# Patient Record
Sex: Male | Born: 1960 | Race: Black or African American | Hispanic: No | Marital: Single | Smoking: Never smoker
Health system: Southern US, Community
[De-identification: ages and names within clinical notes are randomized; demographics above are authoritative.]

## PROBLEM LIST (undated history)

## (undated) DIAGNOSIS — R112 Nausea with vomiting, unspecified: Secondary | ICD-10-CM

## (undated) DIAGNOSIS — Z9889 Other specified postprocedural states: Secondary | ICD-10-CM

## (undated) DIAGNOSIS — M109 Gout, unspecified: Secondary | ICD-10-CM

## (undated) DIAGNOSIS — R739 Hyperglycemia, unspecified: Secondary | ICD-10-CM

## (undated) DIAGNOSIS — R519 Headache, unspecified: Secondary | ICD-10-CM

## (undated) DIAGNOSIS — R7303 Prediabetes: Secondary | ICD-10-CM

## (undated) DIAGNOSIS — G4730 Sleep apnea, unspecified: Secondary | ICD-10-CM

## (undated) DIAGNOSIS — I10 Essential (primary) hypertension: Secondary | ICD-10-CM

## (undated) DIAGNOSIS — J189 Pneumonia, unspecified organism: Secondary | ICD-10-CM

## (undated) DIAGNOSIS — M1990 Unspecified osteoarthritis, unspecified site: Secondary | ICD-10-CM

## (undated) HISTORY — DX: Essential (primary) hypertension: I10

## (undated) HISTORY — DX: Hyperglycemia, unspecified: R73.9

## (undated) HISTORY — PX: KNEE SURGERY: SHX244

## (undated) HISTORY — DX: Gout, unspecified: M10.9

---

## 2009-04-24 ENCOUNTER — Emergency Department (HOSPITAL_BASED_OUTPATIENT_CLINIC_OR_DEPARTMENT_OTHER): Admission: EM | Admit: 2009-04-24 | Discharge: 2009-04-24 | Payer: Self-pay | Admitting: Emergency Medicine

## 2010-06-20 LAB — BASIC METABOLIC PANEL
GFR calc Af Amer: >60 mL/min (ref >60)
Sodium: 143 mEq/L (ref 135–145)

## 2012-03-04 ENCOUNTER — Emergency Department (INDEPENDENT_AMBULATORY_CARE_PROVIDER_SITE_OTHER): Payer: BC Managed Care – PPO

## 2012-03-04 ENCOUNTER — Encounter: Payer: Self-pay | Admitting: Emergency Medicine

## 2012-03-04 ENCOUNTER — Emergency Department
Admission: EM | Admit: 2012-03-04 | Discharge: 2012-03-04 | Disposition: A | Discharge status: Home / Self Care | Payer: BC Managed Care – PPO | Source: Home / Self Care | Attending: Family Medicine | Admitting: Family Medicine

## 2012-03-04 DIAGNOSIS — J111 Influenza due to unidentified influenza virus with other respiratory manifestations: Secondary | ICD-10-CM

## 2012-03-04 DIAGNOSIS — R059 Cough, unspecified: Secondary | ICD-10-CM

## 2012-03-04 DIAGNOSIS — R05 Cough: Secondary | ICD-10-CM

## 2012-03-04 DIAGNOSIS — J069 Acute upper respiratory infection, unspecified: Secondary | ICD-10-CM

## 2012-03-04 DIAGNOSIS — J101 Influenza due to other identified influenza virus with other respiratory manifestations: Secondary | ICD-10-CM

## 2012-03-04 DIAGNOSIS — R6883 Chills (without fever): Secondary | ICD-10-CM

## 2012-03-04 LAB — POCT CBC W AUTO DIFF (K'VILLE URGENT CARE)

## 2012-03-04 LAB — POCT INFLUENZA A/B: Influenza A, POC: POSITIVE

## 2012-03-04 MED ORDER — OSELTAMIVIR PHOSPHATE 75 MG PO CAPS: 75.0000 mg | ORAL_CAPSULE | Freq: Two times a day (BID) | ORAL | Status: DC

## 2012-03-04 MED ORDER — HYDROCOD POLST-CHLORPHEN POLST 10-8 MG/5ML PO LQCR: 5.0000 mL | Freq: Every evening | ORAL | Status: DC | PRN

## 2012-03-04 NOTE — ED Notes (Signed)
Congestion, ears hurt, body aches, chills x 3 weeks

## 2012-03-04 NOTE — ED Provider Notes (Addendum)
History     CSN: [id number]  Arrival date & time 03/04/12  8413   None     Chief Complaint  Patient presents with  . URI      HPI Comments: Patient complains of approximately 3 week history of gradually progressive URI symptoms beginning with a mild sore throat, followed by progressive nasal congestion and a cough.  He has not felt ill during this period, but over the past two days he has developed flu-like symptoms with myalgias, headache, chills, increased fatigue, and increased cough.  Cough is now worse at night and generally non-productive during the day.  There has been no pleuritic pain, shortness of breath, or wheezes, but he does feel tightness in his anterior chest.  He has had some loose stools today, but no nausea/vomiting.  He has not had flu immunization   The history is provided by the patient.    History reviewed. No pertinent past medical history.  Past Surgical History  Procedure Date  . Knee surgery     History reviewed. No pertinent family history.  History  Substance Use Topics  . Smoking status: Never Smoker   . Smokeless tobacco: Not on file  . Alcohol Use: Yes      Review of Systems + sore throat + cough No pleuritic pain, but feels tight in anterior chest No wheezing + nasal congestion + post-nasal drainage No sinus pain/pressure No itchy/red eyes ? earache No hemoptysis No SOB ? fever, + chills No nausea No vomiting No abdominal pain No diarrhea, but loose stools today No urinary symptoms No skin rashes + fatigue + myalgias + headache Used OTC meds without relief  Allergies  Review of patient's allergies indicates no known allergies.  Home Medications   Current Outpatient Rx  Name  Route  Sig  Dispense  Refill  . HYDROCOD POLST-CPM POLST ER 10-8 MG/5ML PO LQCR   Oral   Take 5 mLs by mouth at bedtime as needed.   115 mL   0   . DM-GUAIFENESIN ER 30-600 MG PO TB12   Oral   Take 1 tablet by mouth every 12 (twelve)  hours.         . IBUPROFEN 200 MG PO TABS   Oral   Take 200 mg by mouth every 6 (six) hours as needed.         . OSELTAMIVIR PHOSPHATE 75 MG PO CAPS   Oral   Take 1 capsule (75 mg total) by mouth every 12 (twelve) hours.   10 capsule   0   . PSEUDOEPH-DOXYLAMINE-DM-APAP 60-7.08-30-998 MG/30ML PO LIQD   Oral   Take by mouth.           BP 142/94  Pulse 102  Temp 98.9 F (37.2 C) (Oral)  Resp 16  Ht 6' (1.829 m)  Wt 285 lb (129.275 kg)  BMI 38.65 kg/m2  SpO2 96%  Physical Exam Nursing notes and Vital Signs reviewed. Appearance:  Patient appears stated age, and in no acute distress.  Patient is obese (BMI 38.7) Eyes:  Pupils are equal, round, and reactive to light and accomodation.  Extraocular movement is intact.  Conjunctivae are not inflamed  Ears:  Canals normal.  Tympanic membranes normal.  Nose:  Mildly congested turbinates.  No sinus tenderness.  Pharynx:  Minimal erythema Neck:  Supple.  No adenopathy Lungs:  Clear to auscultation.  Breath sounds are equal.  Heart:  Regular rate and rhythm without murmurs, rubs, or gallops.  Abdomen:  Nontender without masses or hepatosplenomegaly.  Bowel sounds are present.  No CVA or flank tenderness.  Extremities:  No edema.  No calf tenderness Skin:  No rash present.   ED Course  Procedures none   Labs Reviewed  POCT CBC W AUTO DIFF ([facility])   WBC 5.2; LY 16.6; MO 10.1; GR 73.3; Hgb 15.1; Platelets 200   POCT INFLUENZA A/B:  Positive A      1. Influenza A;  Suspect that patient had a minor viral URI that preceded his current influenza infection   2.  Viral upper respiratory infection, resolving    MDM  Begin Tamiflu.  Rx for Tussionex at bedtime. Take Mucinex, or Mucinex D (guaifenesin with decongestant) twice daily for congestion.  Increase fluid intake, rest. May use Afrin nasal spray (or generic oxymetazoline) twice daily for about 5 days.  Also recommend using saline nasal spray several times  daily and saline nasal irrigation (AYR is a common brand) Stop all antihistamines for now, and other non-prescription cough/cold preparations. May take Ibuprofen 200mg , 4 tabs every 8 hours with food for chest/sternum discomfort, headache, fever, etc. Recommend flu shot when well. Follow-up with family doctor if not improving about one week.        Lattie Haw, MD 03/04/12 1610  Lattie Haw, MD 03/26/12 [phone]

## 2012-06-15 ENCOUNTER — Encounter: Payer: Self-pay | Admitting: Emergency Medicine

## 2012-06-15 ENCOUNTER — Emergency Department (INDEPENDENT_AMBULATORY_CARE_PROVIDER_SITE_OTHER)
Admission: EM | Admit: 2012-06-15 | Discharge: 2012-06-15 | Disposition: A | Discharge status: Home / Self Care | Payer: BC Managed Care – PPO | Source: Home / Self Care | Attending: Family Medicine | Admitting: Family Medicine

## 2012-06-15 DIAGNOSIS — M79609 Pain in unspecified limb: Secondary | ICD-10-CM

## 2012-06-15 DIAGNOSIS — M79674 Pain in right toe(s): Secondary | ICD-10-CM

## 2012-06-15 DIAGNOSIS — M109 Gout, unspecified: Secondary | ICD-10-CM

## 2012-06-15 DIAGNOSIS — I10 Essential (primary) hypertension: Secondary | ICD-10-CM

## 2012-06-15 LAB — CBC
MCH: 25.3 pg — ABNORMAL LOW (ref 26.0–34.0)
MCV: 75.5 fL — ABNORMAL LOW (ref 78.0–100.0)
Platelets: 265 10*3/uL (ref 150–400)
RDW: 16.1 % — ABNORMAL HIGH (ref 11.5–15.5)
WBC: 6.3 10*3/uL (ref 4.0–10.5)

## 2012-06-15 LAB — COMPREHENSIVE METABOLIC PANEL
ALT: 16 U/L (ref 0–53)
AST: 16 U/L (ref 0–37)
Chloride: 102 mEq/L (ref 96–112)
Creat: 1.03 mg/dL (ref 0.50–1.35)
Total Bilirubin: 0.5 mg/dL (ref 0.3–1.2)

## 2012-06-15 MED ORDER — AMLODIPINE BESYLATE 10 MG PO TABS: 5.0000 mg | ORAL_TABLET | Freq: Every day | ORAL | Status: DC

## 2012-06-15 MED ORDER — PREDNISONE 50 MG PO TABS: ORAL_TABLET | ORAL | Status: DC

## 2012-06-15 NOTE — ED Provider Notes (Addendum)
History     CSN: [id number]  Arrival date & time 06/15/12  1044   First MD Initiated Contact with Patient 06/15/12 1044      Chief Complaint  Patient presents with  . Toe Pain   HPI  Patient presents today with chief complaint of right great toe pain x2 days. Patient states he a day and last night which included fried chicken any noticed some right great toe pain starting since last night. Patient denies any recent traumas or injury. Pain is been persistent predominantly in the right great toe. Patient tries tramadol for pain with minimal improvement in symptoms. Pain is persisted throughout the night. Has had some mild redness and swelling. This is never happened before. Patient denies any family history of gout however does report high alcohol as well as high dark meat intake. Patient is obese. Patient is also noted to have elevated blood pressure today. Patient denies any chest pain shortness of breath headache. Patient does not have a PCP. Is currently not on medication for high blood pressure.   History reviewed. No pertinent past medical history.  Past Surgical History  Procedure Laterality Date  . Knee surgery      History reviewed. No pertinent family history.  History  Substance Use Topics  . Smoking status: Never Smoker   . Smokeless tobacco: Not on file  . Alcohol Use: Yes      Review of Systems  All other systems reviewed and are negative.    Allergies  Review of patient's allergies indicates no known allergies.  Home Medications   Current Outpatient Rx  Name  Route  Sig  Dispense  Refill  . chlorpheniramine-HYDROcodone (TUSSIONEX PENNKINETIC ER) 10-8 MG/5ML LQCR   Oral   Take 5 mLs by mouth at bedtime as needed.   115 mL   0   . dextromethorphan-guaiFENesin (MUCINEX DM) 30-600 MG per 12 hr tablet   Oral   Take 1 tablet by mouth every 12 (twelve) hours.         Marland Kitchen ibuprofen (ADVIL,MOTRIN) 200 MG tablet   Oral   Take 200 mg by mouth every 6  (six) hours as needed.         Marland Kitchen oseltamivir (TAMIFLU) 75 MG capsule   Oral   Take 1 capsule (75 mg total) by mouth every 12 (twelve) hours.   10 capsule   0   . Pseudoeph-Doxylamine-DM-APAP (NYQUIL) 60-7.08-30-998 MG/30ML LIQD   Oral   Take by mouth.           BP 169/122  Pulse 94  Temp(Src) 97.9 F (36.6 C) (Oral)  Ht 6' (1.829 m)  Wt 285 lb (129.275 kg)  BMI 38.64 kg/m2  SpO2 97%  Physical Exam  Constitutional:  Obese   HENT:  Head: Normocephalic and atraumatic.  Right Ear: External ear normal.  Left Ear: External ear normal.  Eyes: Conjunctivae are normal. Pupils are equal, round, and reactive to light.  Neck: Normal range of motion.  Pulmonary/Chest: Effort normal and breath sounds normal.  Abdominal:  Obese abdomen  Musculoskeletal:       Feet:  Neurological: He is alert.  Skin: Skin is warm.    ED Course  Procedures (including critical care time)  Labs Reviewed - No data to display No results found.  EKG: normal EKG, normal sinus rhythm.   1. Great toe pain, right   2. Podagra   3. Unspecified essential hypertension       MDM  Toe Pain:  Exam clinically consistent with classic podagra/gout flare. Will start patient on prednisone for treatment. Avoiding NSAIDs in the setting of high blood pressure.  Hypertension: Clinically asymptomatic. I suspect that pain from gout flare is likely contributing. EKG NSR.  We'll check baseline labs including CBC, CMET, direct LDL, TSH. Will start patient on low-dose Norvasc for treatment with plan to followup with primary care within the next 1-2 days. Patient will come in tomorrow for blood pressure recheck. Discuss cardiovascular red flags at length with patient.    The patient and/or caregiver has been counseled thoroughly with regard to treatment plan and/or medications prescribed including dosage, schedule, interactions, rationale for use, and possible side effects and they verbalize understanding.  Diagnoses and expected course of recovery discussed and will return if not improved as expected or if the condition worsens. Patient and/or caregiver verbalized understanding.             Doree Albee, MD 06/15/12 1143  Doree Albee, MD 06/15/12 1226

## 2012-06-15 NOTE — ED Notes (Signed)
Reports onset of severe pain in large toe on right foot last night.

## 2012-06-18 LAB — HEMOGLOBIN A1C: Mean Plasma Glucose: 140 mg/dL — ABNORMAL HIGH (ref <117)

## 2012-06-19 ENCOUNTER — Ambulatory Visit (INDEPENDENT_AMBULATORY_CARE_PROVIDER_SITE_OTHER): Payer: BC Managed Care – PPO | Admitting: Family Medicine

## 2012-06-19 ENCOUNTER — Encounter: Payer: Self-pay | Admitting: Family Medicine

## 2012-06-19 ENCOUNTER — Telehealth: Payer: Self-pay

## 2012-06-19 VITALS — BP 150/92 | HR 120 | Ht 71.0 in | Wt 287.0 lb

## 2012-06-19 DIAGNOSIS — M109 Gout, unspecified: Secondary | ICD-10-CM

## 2012-06-19 DIAGNOSIS — R351 Nocturia: Secondary | ICD-10-CM | POA: Insufficient documentation

## 2012-06-19 DIAGNOSIS — I10 Essential (primary) hypertension: Secondary | ICD-10-CM | POA: Insufficient documentation

## 2012-06-19 DIAGNOSIS — R739 Hyperglycemia, unspecified: Secondary | ICD-10-CM | POA: Insufficient documentation

## 2012-06-19 DIAGNOSIS — R7309 Other abnormal glucose: Secondary | ICD-10-CM

## 2012-06-19 HISTORY — DX: Essential (primary) hypertension: I10

## 2012-06-19 HISTORY — DX: Gout, unspecified: M10.9

## 2012-06-19 HISTORY — DX: Hyperglycemia, unspecified: R73.9

## 2012-06-19 LAB — PSA: PSA: 0.65 ng/mL (ref <=4.00)

## 2012-06-19 MED ORDER — INDOMETHACIN 50 MG PO CAPS: ORAL_CAPSULE | ORAL | Status: DC

## 2012-06-19 MED ORDER — AMLODIPINE BESYLATE 10 MG PO TABS: 10.0000 mg | ORAL_TABLET | Freq: Every day | ORAL | Status: DC

## 2012-06-19 MED ORDER — TAMSULOSIN HCL 0.4 MG PO CAPS: 0.4000 mg | ORAL_CAPSULE | Freq: Every day | ORAL | Status: DC

## 2012-06-19 NOTE — Progress Notes (Signed)
CC: Ernest Navarro is a 52 y.o. male is here for Establish Care and Hypertension   Subjective: HPI:  Pleasant 52 year old here to establish care  Followup hypertension: Diagnosis was given to him this past week in urgent care. He has never been on antihypertensive medication in the past. No outside blood pressures to report. Does not believe his blood pressure has been in issue in the past. Has started amlodipine 5 mg a day without constipation or peripheral edema.  Followup gout: This past week in urgent care he was complaining of a quite tender, red, and swollen base of the right great toe. Since starting prednisone he is noticed significant improvement of his pain however it is still mildly annoying. Worse with bearing weight, flexion, extension. No pain at rest. Swelling and redness is gone. He has a long weekend golf outing tomorrow and concerned about his ability to play with his mild residual pain. He has never had gout before. He admits to a large shellfish dinner the night before his gout flare. Denies recent or remote use of heavy alcohol use  Patient complains of waking to urinate 3-4 times on average every evening. This has been present for 4 months. He does not seem to be getting better or worse since onset. It had a gradual onset over one to 2 weeks. He complains of occasional dribbling after urination. He denies straining to urinate or sensation of incomplete voiding. Denies personal or family history of prostate cancer, denies unintentional weight loss, dysuria, penile discharge.  Review of Systems - General ROS: negative for - chills, fever, night sweats, weight gain or weight loss Ophthalmic ROS: negative for - decreased vision Psychological ROS: negative for - anxiety or depression ENT ROS: negative for - hearing change, nasal congestion, tinnitus or allergies Hematological and Lymphatic ROS: negative for - bleeding problems, bruising or swollen lymph nodes Breast ROS:  negative Respiratory ROS: no cough, shortness of breath, or wheezing Cardiovascular ROS: no chest pain or dyspnea on exertion Gastrointestinal ROS: no abdominal pain, change in bowel habits, or black or bloody stools Genito-Urinary ROS: negative for - genital discharge, genital ulcers,or abnormal bleeding from genitals Musculoskeletal ROS: negative for - joint pain or muscle pain other than that described above Neurological ROS: negative for - headaches or memory loss Dermatological ROS: negative for lumps, mole changes, rash and skin lesion changes  Past Medical History  Diagnosis Date  . Essential hypertension, benign 06/19/2012  . Gout 06/19/2012  . Hyperglycemia 06/19/2012    March 2014 A1c 6.5 (repeat needed)      History reviewed. No pertinent family history.   History  Substance Use Topics  . Smoking status: Never Smoker   . Smokeless tobacco: Not on file  . Alcohol Use: Yes     Objective: Filed Vitals:   06/19/12 0921  BP: 150/92  Pulse: 120    General: Alert and Oriented, No Acute Distress HEENT: Pupils equal, round, reactive to light. Conjunctivae clear.   Moist mucous membranes, pharynx without inflammation nor lesions.  Neck supple without palpable lymphadenopathy nor abnormal masses. Lungs: Clear to auscultation bilaterally, no wheezing/ronchi/rales.  Comfortable work of breathing. Good air movement. Cardiac: Regular rate and rhythm. Normal S1/S2.  No murmurs, rubs, nor gallops.   Abdomen:  obese soft nontender  Extremities: No peripheral edema.  Strong peripheral pulses. Unremarkable right great toe  Mental Status: No depression, anxiety, nor agitation. Skin: Warm and dry.  Assessment & Plan: Ernie was seen today for establish care and hypertension.  Diagnoses and associated orders for this visit:  Hyperglycemia  Essential hypertension, benign - amLODipine (NORVASC) 10 MG tablet; Take 1 tablet (10 mg total) by mouth daily.  Gout - Discontinue:  indomethacin (INDOCIN) 50 MG capsule; Take three by mouth daily, then two by mouth, then one by mouth. - indomethacin (INDOCIN) 50 MG capsule; Take three by mouth daily, then two by mouth, then one by mouth.  Nocturia - PSA - tamsulosin (FLOMAX) 0.4 MG CAPS; Take 1 capsule (0.4 mg total) by mouth daily.    Hyperglycemia: A1c of 6.5 in urgent care however blood sugar at that time was well below 100. We will recheck blood sugar once he is well off of prednisone. Essential hypertension: Uncontrolled, increase Norvasc to 10 mg a day with recheck in clinic in 4 weeks,  diet and exercise interventions discussed Gout: Improved, provided patient with as needed use of indomethacin should his pain flareup with walking/golf this coming weekend, discussed diet interventions to prevent future occurrence Nocturia: Discussed my suspicion for prostate hypertrophy, patient would prefer PSA today and we'll on DRE until PSA is obtained first. Patient will try Flomax to see if this helps in the evening and we will readdress this in 4 weeks or sooner if deteriorating  45 minutes spent face-to-face during visit today of which at least 50% was counseling or coordinating care regarding hyperglycemia, essential hypertension, gout, nocturia.   Return in about 4 weeks (around 07/17/2012).

## 2012-06-19 NOTE — ED Notes (Signed)
Left a message on voice mail asking how patient is feeling and advising to call back with any questions or concerns.  

## 2012-06-19 NOTE — Patient Instructions (Addendum)
Gout Gout is an inflammatory condition (arthritis) caused by a buildup of uric acid crystals in the joints. Uric acid is a chemical that is normally present in the blood. Under some circumstances, uric acid can form into crystals in your joints. This causes joint redness, soreness, and swelling (inflammation). Repeat attacks are common. Over time, uric acid crystals can form into masses (tophi) near a joint, causing disfigurement. Gout is treatable and often preventable. CAUSES  The disease begins with elevated levels of uric acid in the blood. Uric acid is produced by your body when it breaks down a naturally found substance called purines. This also happens when you eat certain foods such as meats and fish. Causes of an elevated uric acid level include:  Being passed down from parent to child (heredity).  Diseases that cause increased uric acid production (obesity, psoriasis, some cancers).  Excessive alcohol use.  Diet, especially diets rich in meat and seafood.  Medicines, including certain cancer-fighting drugs (chemotherapy), diuretics, and aspirin.  Chronic kidney disease. The kidneys are no longer able to remove uric acid well.  Problems with metabolism. Conditions strongly associated with gout include:  Obesity.  High blood pressure.  High cholesterol.  Diabetes. Not everyone with elevated uric acid levels gets gout. It is not understood why some people get gout and others do not. Surgery, joint injury, and eating too much of certain foods are some of the factors that can lead to gout. SYMPTOMS   An attack of gout comes on quickly. It causes intense pain with redness, swelling, and warmth in a joint.  Fever can occur.  Often, only one joint is involved. Certain joints are more commonly involved:  Base of the big toe.  Knee.  Ankle.  Wrist.  Finger. Without treatment, an attack usually goes away in a few days to weeks. Between attacks, you usually will not have  symptoms, which is different from many other forms of arthritis. DIAGNOSIS  Your caregiver will suspect gout based on your symptoms and exam. Removal of fluid from the joint (arthrocentesis) is done to check for uric acid crystals. Your caregiver will give you a medicine that numbs the area (local anesthetic) and use a needle to remove joint fluid for exam. Gout is confirmed when uric acid crystals are seen in joint fluid, using a special microscope. Sometimes, blood, urine, and X-ray tests are also used. TREATMENT  There are 2 phases to gout treatment: treating the sudden onset (acute) attack and preventing attacks (prophylaxis). Treatment of an Acute Attack  Medicines are used. These include anti-inflammatory medicines or steroid medicines.  An injection of steroid medicine into the affected joint is sometimes necessary.  The painful joint is rested. Movement can worsen the arthritis.  You may use warm or cold treatments on painful joints, depending which works best for you.  Discuss the use of coffee, vitamin C, or cherries with your caregiver. These may be helpful treatment options. Treatment to Prevent Attacks After the acute attack subsides, your caregiver may advise prophylactic medicine. These medicines either help your kidneys eliminate uric acid from your body or decrease your uric acid production. You may need to stay on these medicines for a very long time. The early phase of treatment with prophylactic medicine can be associated with an increase in acute gout attacks. For this reason, during the first few months of treatment, your caregiver may also advise you to take medicines usually used for acute gout treatment. Be sure you understand your caregiver's directions.   You should also discuss dietary treatment with your caregiver. Certain foods such as meats and fish can increase uric acid levels. Other foods such as dairy can decrease levels. Your caregiver can give you a list of foods  to avoid. HOME CARE INSTRUCTIONS   Do not take aspirin to relieve pain. This raises uric acid levels.  Only take over-the-counter or prescription medicines for pain, discomfort, or fever as directed by your caregiver.  Rest the joint as much as possible. When in bed, keep sheets and blankets off painful areas.  Keep the affected joint raised (elevated).  Use crutches if the painful joint is in your leg.  Drink enough water and fluids to keep your urine clear or pale yellow. This helps your body get rid of uric acid. Do not drink alcoholic beverages. They slow the passage of uric acid.  Follow your caregiver's dietary instructions. Pay careful attention to the amount of protein you eat. Your daily diet should emphasize fruits, vegetables, whole grains, and fat-free or low-fat milk products.  Maintain a healthy body weight. SEEK MEDICAL CARE IF:   You have an oral temperature above 102 F (38.9 C).  You develop diarrhea, vomiting, or any side effects from medicines.  You do not feel better in 24 hours, or you are getting worse. SEEK IMMEDIATE MEDICAL CARE IF:   Your joint becomes suddenly more tender and you have:  Chills.  An oral temperature above 102 F (38.9 C), not controlled by medicine. MAKE SURE YOU:   Understand these instructions.  Will watch your condition.  Will get help right away if you are not doing well or get worse. Document Released: 03/17/2000 Document Revised: 06/12/2011 Document Reviewed: 06/28/2009 Exit[employer] Patient Information 2013 [employer].    

## 2012-07-17 ENCOUNTER — Encounter: Payer: Self-pay | Admitting: Family Medicine

## 2012-07-17 ENCOUNTER — Ambulatory Visit (INDEPENDENT_AMBULATORY_CARE_PROVIDER_SITE_OTHER): Payer: BC Managed Care – PPO | Admitting: Family Medicine

## 2012-07-17 VITALS — BP 145/95 | HR 84 | Wt 289.0 lb

## 2012-07-17 DIAGNOSIS — R7309 Other abnormal glucose: Secondary | ICD-10-CM

## 2012-07-17 DIAGNOSIS — I10 Essential (primary) hypertension: Secondary | ICD-10-CM

## 2012-07-17 DIAGNOSIS — R739 Hyperglycemia, unspecified: Secondary | ICD-10-CM

## 2012-07-17 DIAGNOSIS — M109 Gout, unspecified: Secondary | ICD-10-CM

## 2012-07-17 DIAGNOSIS — R351 Nocturia: Secondary | ICD-10-CM

## 2012-07-17 NOTE — Progress Notes (Signed)
CC: Ernest Navarro is a 53 y.o. male is here for Hypertension   Subjective: HPI:   Followup hypertension: Since last visit has been taking amlodipine 10 mg on a daily basis. 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 Try to watch what he eats, no salt restriction. No outside blood pressures to report. Denies headaches, motor or sensory disturbances, chest pain, lightheadedness, confusion, shortness of breath, irregular heartbeat, constipation, nor peripheral edema  Followup hyperglycemia: Patient denies known history of elevated blood sugar, A1c of 6.5 on initial check last month. Patient denies polyuria polyphasia or polydipsia. Denies vision loss nor poorly healing wounds.  History of gout, pain in the right foot is now completely gone, pain was absent days after seeing me. He is able to play golf and continue with his lifestyle and hobbies without discomfort in the right toe. Denies any past history of gout prior to this episode.  Followup nocturia: Since starting Flomax he reports significant improvement in decreased number of awakenings at night to urinate. Says on average 1-2 but no more than 2 awakenings every night. Admits to drinking large volumes of water throughout the day and does not restrict near bedtime. Denies straining to urinate, incomplete voiding sensation, weak stream, nor post void dribbling     Review Of Systems Outlined In HPI  Past Medical History  Diagnosis Date  . Essential hypertension, benign 06/19/2012  . Gout 06/19/2012  . Hyperglycemia 06/19/2012    March 2014 A1c 6.5 (repeat needed)      History reviewed. No pertinent family history.   History  Substance Use Topics  . Smoking status: Never Smoker   . Smokeless tobacco: Not on file  . Alcohol Use: Yes     Objective: Filed Vitals:   07/17/12 0822  BP: 145/95  Pulse: 84    General: Alert and Oriented, No Acute Distress HEENT: Pupils  equal, round, reactive to light. Conjunctivae clear.  Moist mucous membranes Lungs: Clear to auscultation bilaterally, no wheezing/ronchi/rales.  Comfortable work of breathing. Good air movement. Cardiac: Regular rate and rhythm. Normal S1/S2.  No murmurs, rubs, nor gallops.   Abdomen: Obese soft nontender Extremities: No peripheral edema.  Strong peripheral pulses.  Mental Status: No depression, anxiety, nor agitation. Skin: Warm and dry.  Assessment & Plan: Ernest Navarro was seen today for hypertension.  Diagnoses and associated orders for this visit:  Nocturia  Hyperglycemia - BASIC METABOLIC PANEL WITH GFR  Gout  Essential hypertension, benign    Nocturia: Improved and stable, continue Flomax, will consider Cardura if blood pressure issues continue and nocturia does not continue to improve Hyperglycemia: Obtaining fasting glucose in the near future as he is not fasting today gout: Controlled, discussed diet lifestyle and exercise interventions to help minimize future flares. 2 consider preventative therapy if recurrence in the next 5 months Essential hypertension: Uncontrolled, encourage patient to start lisinopril due to uncontrolled blood pressure. Patient declines offer for prefer to wait another 30 days focusing on salt restriction of less than 2 g a day and increasing physical activity  25 minutes spent face-to-face during visit today of which at least 50% was counseling or coordinating care regarding Gout, essential hypertension, hyperglycemia, nocturia.   Return in about 4 weeks (around 08/14/2012).

## 2012-08-15 ENCOUNTER — Ambulatory Visit: Payer: BC Managed Care – PPO | Admitting: Family Medicine

## 2012-08-15 DIAGNOSIS — Z0289 Encounter for other administrative examinations: Secondary | ICD-10-CM

## 2012-11-03 ENCOUNTER — Other Ambulatory Visit: Payer: Self-pay | Admitting: Family Medicine

## 2014-01-12 IMAGING — CR DG CHEST 2V
2 series · 2 of 2 positions shown · non-contrast
Comparison: None.

CLINICAL DATA: Cough for 3 weeks, now with chills and sweats

CHEST - 2 VIEW

[view not recorded (1 of 2)]
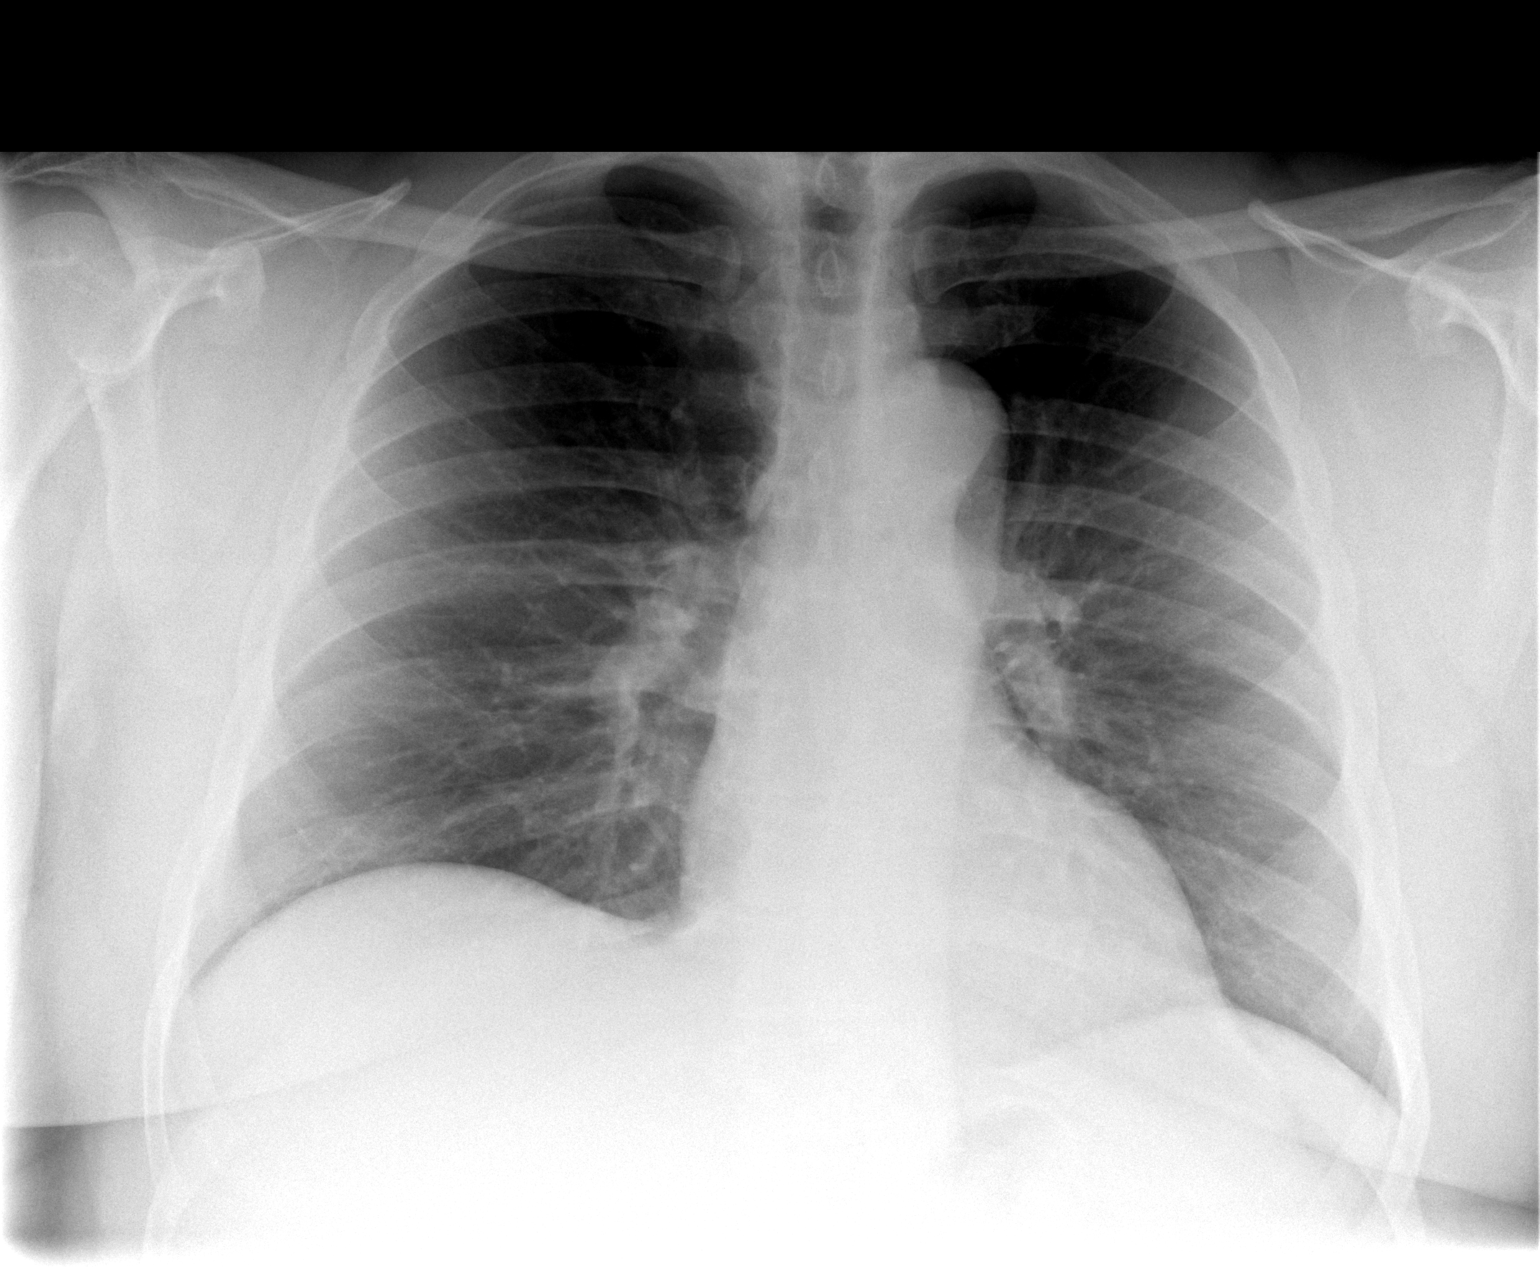

[view not recorded (2 of 2)]
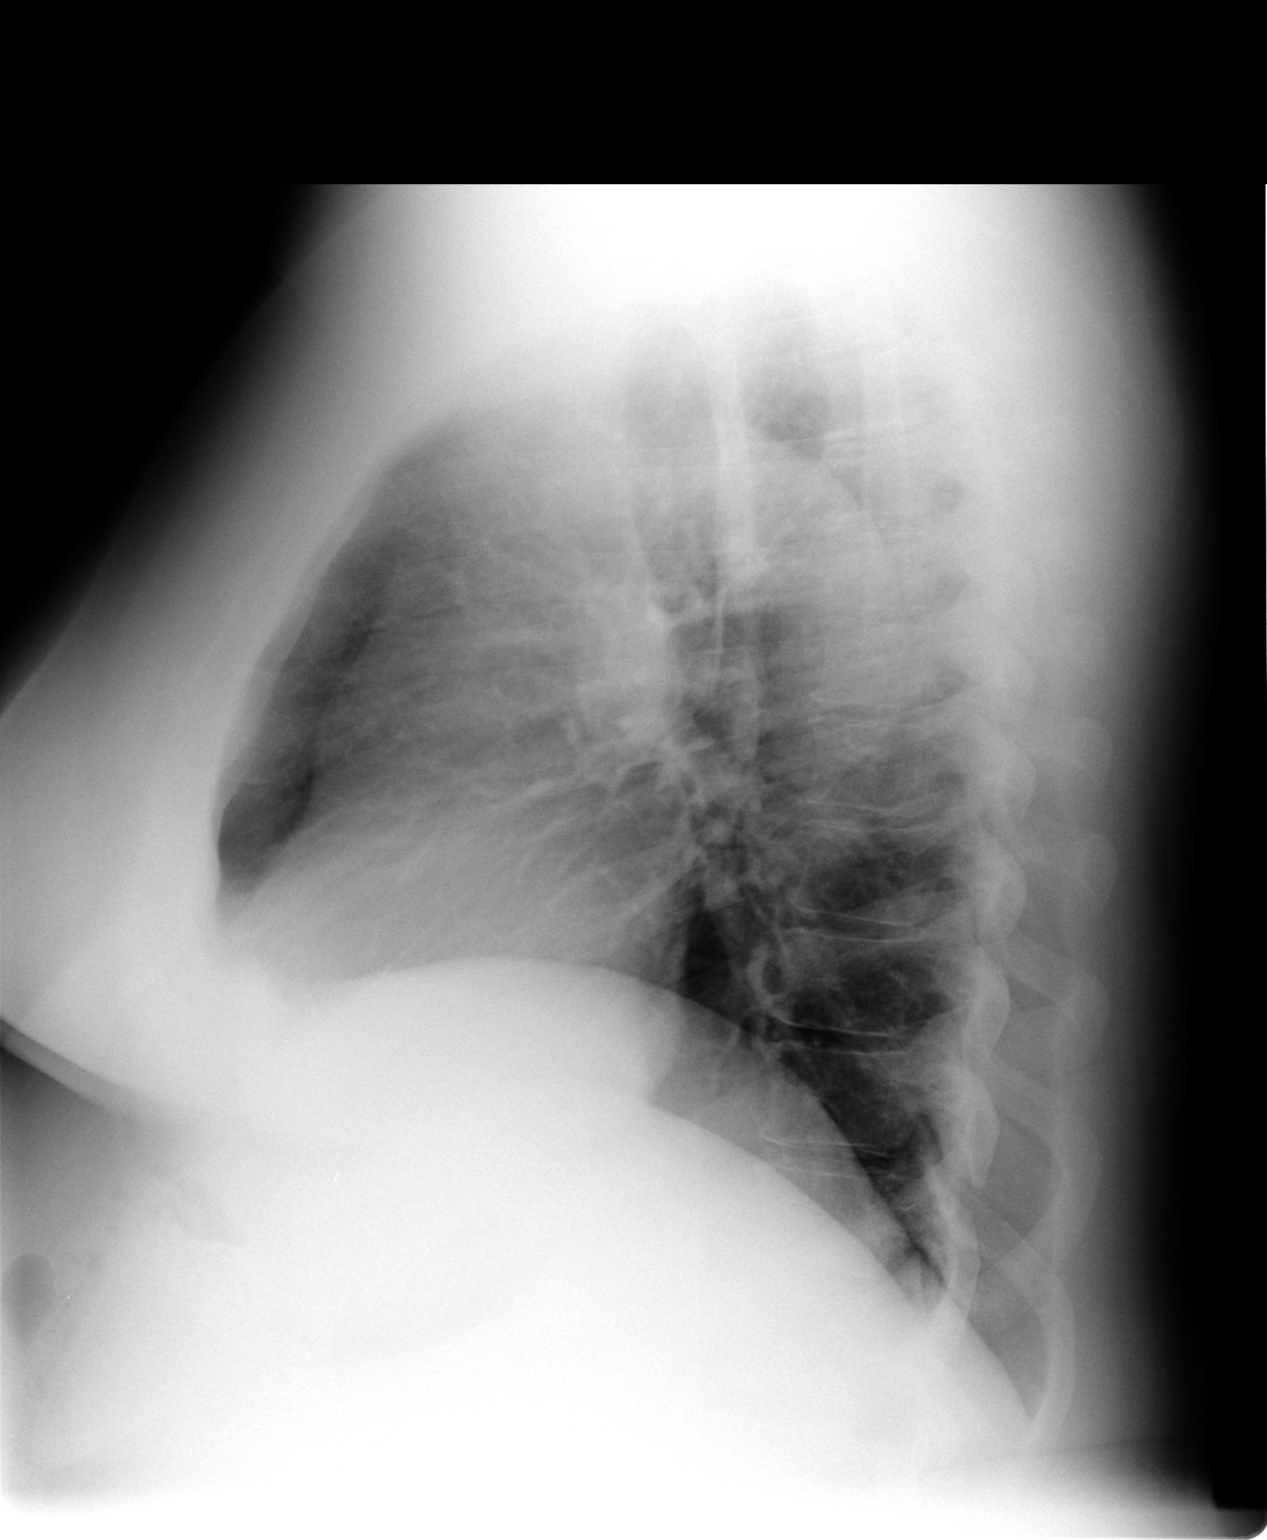

[2 of 2 positions shown; findings below may reference images not displayed]

FINDINGS: No pneumonia is seen.  No effusion is noted.  Mediastinal
contours appear within normal limits.  The heart is normal in size.
No bony abnormality is seen.
IMPRESSION: No active lung disease.

## 2015-05-06 ENCOUNTER — Ambulatory Visit (INDEPENDENT_AMBULATORY_CARE_PROVIDER_SITE_OTHER): Payer: BLUE CROSS/BLUE SHIELD | Admitting: Internal Medicine

## 2015-05-06 ENCOUNTER — Encounter: Payer: Self-pay | Admitting: Internal Medicine

## 2015-05-06 VITALS — BP 142/78 | HR 87 | Ht 72.0 in | Wt 292.7 lb

## 2015-05-06 DIAGNOSIS — G4733 Obstructive sleep apnea (adult) (pediatric): Secondary | ICD-10-CM | POA: Insufficient documentation

## 2015-05-06 DIAGNOSIS — J309 Allergic rhinitis, unspecified: Secondary | ICD-10-CM

## 2015-05-06 DIAGNOSIS — J3089 Other allergic rhinitis: Secondary | ICD-10-CM

## 2015-05-06 DIAGNOSIS — J302 Other seasonal allergic rhinitis: Secondary | ICD-10-CM | POA: Insufficient documentation

## 2015-05-06 HISTORY — DX: Obstructive sleep apnea (adult) (pediatric): G47.33

## 2015-05-06 NOTE — Patient Instructions (Addendum)
Order- schedule unattended home sleep test    Dx OSA  Try otc Flonase/ fluticasone nasal spray  1-2 puffs each nostril once daily at  bedtime  Please call as needed

## 2015-05-06 NOTE — Assessment & Plan Note (Signed)
Recommend trial of Flonase

## 2015-05-06 NOTE — Progress Notes (Signed)
05/05/2015-55 year old male never smoker Self Referral-pt states his wife has noticed he wakes up after he stops breathing during sleep. Pt does not feel he goes into deep sleep. Had sleep study 2005/2006 and was on CPAP-started loosing wieght and came off of it. Now his weight is back up and symptoms are back. Is interested in oral appliance. Remote diagnosis of obstructive sleep apnea in [place] years ago-report not available. He stopped CPAP after he lost weight. Married 6 months ago and has regained his weight. Wife reports witnessed apneas, loud snoring, worse on his back. He is aware of some nasal stuffiness especially supine but also seasonal. Sleep hygiene reviewed. No ENT surgery and no diagnosed heart or lung disorder. 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  Prior to Admission medications   Not on File   Past Medical History  Diagnosis Date  . Essential hypertension, benign 06/19/2012  . Gout 06/19/2012  . Hyperglycemia 06/19/2012    March 2014 A1c 6.5 (repeat needed)    Past Surgical History  Procedure Laterality Date  . Knee surgery     No family history on file. Social History   Social History  . Marital Status: Single    Spouse Name: N/A  . Number of Children: N/A  . Years of Education: N/A   Occupational History  . Not on file.   Social History Main Topics  . Smoking status: Never Smoker   . Smokeless tobacco: Not on file  . Alcohol Use: Yes  . Drug Use: No  . Sexual Activity: Not on file   Other Topics Concern  . Not on file   Social History Narrative   ROS-see HPI   Negative unless "+" Constitutional:    weight loss, night sweats, fevers, chills, +fatigue, lassitude. HEENT:    headaches, difficulty swallowing, tooth/dental problems, sore throat,       sneezing, itching, ear ache, nasal congestion, post nasal drip, snoring CV:    chest pain, orthopnea, PND, swelling in lower extremities, anasarca,                                                        dizziness, palpitations Resp:   shortness of breath with exertion or at rest.                productive cough,   non-productive cough, coughing up of blood.              change in color of mucus.  wheezing.   Skin:    rash or lesions. GI:  No-   heartburn, indigestion, abdominal pain, nausea, vomiting, diarrhea,                 change in bowel habits, loss of appetite GU: dysuria, change in color of urine, no urgency or frequency.   flank pain. MS:   joint pain, stiffness, decreased range of motion, back pain. Neuro-     nothing unusual Psych:  change in mood or affect.  depression or anxiety.   memory loss.  OBJ- Physical Exam General- Alert, Oriented, Affect-appropriate, Distress- none acute, + obese Skin- rash-none, lesions- none, excoriation- none Lymphadenopathy- none Head- atraumatic            Eyes- Gross vision intact, PERRLA, conjunctivae and secretions clear  Ears- Hearing, canals-normal            Nose- , no-Septal dev, mucus+, polyps, erosion, perforation             Throat- Mallampati III-IV , mucosa clear , drainage- none, tonsils- atrophic Neck- flexible , trachea midline, no stridor , thyroid nl, carotid no bruit Chest - symmetrical excursion , unlabored           Heart/CV- RRR , no murmur , no gallop  , no rub, nl s1 s2                           - JVD- none , edema- none, stasis changes- none, varices- none           Lung- clear to P&A, wheeze- none, cough- none , dullness-none, rub- none           Chest wall-  Abd-  Br/ Gen/ Rectal- Not done, not indicated Extrem- cyanosis- none, clubbing, none, atrophy- none, strength- nl Neuro- grossly intact to observation

## 2015-05-15 ENCOUNTER — Encounter: Payer: Self-pay | Admitting: Emergency Medicine

## 2015-05-15 ENCOUNTER — Telehealth: Payer: Self-pay | Admitting: Emergency Medicine

## 2015-05-15 ENCOUNTER — Emergency Department (INDEPENDENT_AMBULATORY_CARE_PROVIDER_SITE_OTHER)
Admission: EM | Admit: 2015-05-15 | Discharge: 2015-05-15 | Disposition: A | Discharge status: Home / Self Care | Payer: BLUE CROSS/BLUE SHIELD | Source: Home / Self Care | Attending: Family Medicine | Admitting: Family Medicine

## 2015-05-15 ENCOUNTER — Encounter (HOSPITAL_COMMUNITY): Payer: Self-pay | Admitting: Emergency Medicine

## 2015-05-15 ENCOUNTER — Emergency Department (HOSPITAL_COMMUNITY)
Admission: EM | Admit: 2015-05-15 | Discharge: 2015-05-15 | Disposition: A | Discharge status: Home / Self Care | Payer: BLUE CROSS/BLUE SHIELD | Attending: Emergency Medicine | Admitting: Emergency Medicine

## 2015-05-15 DIAGNOSIS — I160 Hypertensive urgency: Secondary | ICD-10-CM | POA: Diagnosis not present

## 2015-05-15 DIAGNOSIS — I10 Essential (primary) hypertension: Secondary | ICD-10-CM | POA: Diagnosis not present

## 2015-05-15 DIAGNOSIS — J36 Peritonsillar abscess: Secondary | ICD-10-CM

## 2015-05-15 DIAGNOSIS — J392 Other diseases of pharynx: Secondary | ICD-10-CM | POA: Diagnosis not present

## 2015-05-15 DIAGNOSIS — R6884 Jaw pain: Secondary | ICD-10-CM

## 2015-05-15 DIAGNOSIS — M542 Cervicalgia: Secondary | ICD-10-CM | POA: Diagnosis present

## 2015-05-15 DIAGNOSIS — R000 Tachycardia, unspecified: Secondary | ICD-10-CM | POA: Insufficient documentation

## 2015-05-15 DIAGNOSIS — R030 Elevated blood-pressure reading, without diagnosis of hypertension: Secondary | ICD-10-CM

## 2015-05-15 DIAGNOSIS — H9319 Tinnitus, unspecified ear: Secondary | ICD-10-CM | POA: Diagnosis not present

## 2015-05-15 DIAGNOSIS — IMO0001 Reserved for inherently not codable concepts without codable children: Secondary | ICD-10-CM

## 2015-05-15 LAB — BASIC METABOLIC PANEL
Anion gap: 11 (ref 5–15)
BUN: 8 mg/dL (ref 6–20)
CHLORIDE: 106 mmol/L (ref 101–111)
CO2: 24 mmol/L (ref 22–32)
CREATININE: 0.98 mg/dL (ref 0.61–1.24)
Calcium: 9.4 mg/dL (ref 8.9–10.3)
GFR calc Af Amer: >60 mL/min (ref >60)
GFR calc non Af Amer: >60 mL/min (ref >60)
GLUCOSE: 107 mg/dL — AB (ref 65–99)
Potassium: 3.7 mmol/L (ref 3.5–5.1)
SODIUM: 141 mmol/L (ref 135–145)

## 2015-05-15 LAB — CBC WITH DIFFERENTIAL/PLATELET
Basophils Absolute: 0 10*3/uL (ref 0.0–0.1)
Basophils Relative: 0 %
EOS ABS: 0.1 10*3/uL (ref 0.0–0.7)
EOS PCT: 1 %
HCT: 46.2 % (ref 39.0–52.0)
Hemoglobin: 14.8 g/dL (ref 13.0–17.0)
LYMPHS ABS: 0.9 10*3/uL (ref 0.7–4.0)
Lymphocytes Relative: 8 %
MCH: 24.9 pg — AB (ref 26.0–34.0)
MCHC: 32 g/dL (ref 30.0–36.0)
MCV: 77.6 fL — AB (ref 78.0–100.0)
MONO ABS: 0.4 10*3/uL (ref 0.1–1.0)
MONOS PCT: 4 %
Neutro Abs: 9.5 10*3/uL — ABNORMAL HIGH (ref 1.7–7.7)
Neutrophils Relative %: 87 %
PLATELETS: 253 10*3/uL (ref 150–400)
RBC: 5.95 MIL/uL — AB (ref 4.22–5.81)
RDW: 15.1 % (ref 11.5–15.5)
WBC: 10.9 10*3/uL — AB (ref 4.0–10.5)

## 2015-05-15 MED ORDER — CLINDAMYCIN HCL 150 MG PO CAPS: 450.0000 mg | ORAL_CAPSULE | Freq: Three times a day (TID) | ORAL | Status: DC

## 2015-05-15 MED ORDER — METHYLPREDNISOLONE SODIUM SUCC 40 MG IJ SOLR
80.0000 mg | Freq: Once | INTRAMUSCULAR | Status: AC
  Administered 2015-05-15: 80 mg via INTRAMUSCULAR

## 2015-05-15 MED ORDER — LABETALOL HCL 5 MG/ML IV SOLN
10.0000 mg | Freq: Once | INTRAVENOUS | Status: AC
  Administered 2015-05-15: 10 mg via INTRAVENOUS
  Filled 2015-05-15: qty 4

## 2015-05-15 MED ORDER — KETOROLAC TROMETHAMINE 60 MG/2ML IM SOLN
60.0000 mg | Freq: Once | INTRAMUSCULAR | Status: AC
  Administered 2015-05-15: 60 mg via INTRAMUSCULAR

## 2015-05-15 MED ORDER — OXYCODONE-ACETAMINOPHEN 5-325 MG PO TABS
1.0000 | ORAL_TABLET | Freq: Once | ORAL | Status: AC
  Administered 2015-05-15: 1 via ORAL
  Filled 2015-05-15: qty 1

## 2015-05-15 MED ORDER — OXYCODONE-ACETAMINOPHEN 5-325 MG PO TABS: 1.0000 | ORAL_TABLET | Freq: Three times a day (TID) | ORAL | Status: DC | PRN

## 2015-05-15 MED ORDER — AMLODIPINE BESYLATE 5 MG PO TABS: 5.0000 mg | ORAL_TABLET | Freq: Every day | ORAL | Status: DC

## 2015-05-15 MED ORDER — CEFTRIAXONE SODIUM 1 G IJ SOLR
1.0000 g | Freq: Once | INTRAMUSCULAR | Status: AC
  Administered 2015-05-15: 1 g via INTRAMUSCULAR

## 2015-05-15 MED ORDER — SODIUM CHLORIDE 0.9 % IV SOLN
INTRAVENOUS | Status: DC
  Administered 2015-05-15: 18:00:00 via INTRAVENOUS

## 2015-05-15 NOTE — ED Notes (Signed)
Pt sent from UC for evaluation of tonsillar abscess and hypertension. Pt denies SOB.

## 2015-05-15 NOTE — ED Provider Notes (Signed)
CSN: [id number]     Arrival date & time 05/15/15  1453 History   First MD Initiated Contact with Patient 05/15/15 1517     Chief Complaint  Patient presents with  . Lymphadenopathy   (Consider location/radiation/quality/duration/timing/severity/associated sxs/prior Treatment) HPI The pt is a 55yo male presenting to [facility] with c/o 8/10 Right sided jaw pain that started about 1 week ago.  Pain is aching and sore, worse with opening his mouth and mildly worse with palpation to outside of his neck but he states pain feels deeper inside.  He is able to swallow liquids but it has become more difficult this morning.  He did take old Penicillin yesterday but did not help with symptoms. Denies any difficulty breathing.  Denies cough, congestion, fever, nausea or vomiting.  Blood pressure noted to be very elevated in triage, taken multiple times. Pt initially stated he had no hx of HTN but medical records indicate he use to be on amlodipine  daily as early as 2014.  He is not currently taking any daily medications. Denies chest pain, headache, dizziness, or numbness. Denies change in vision.    Past Medical History  Diagnosis Date  . Essential hypertension, benign 06/19/2012  . Gout 06/19/2012  . Hyperglycemia 06/19/2012    March 2014 A1c 6.5 (repeat needed)    Past Surgical History  Procedure Laterality Date  . Knee surgery     History reviewed. No pertinent family history. Social History  Substance Use Topics  . Smoking status: Never Smoker   . Smokeless tobacco: None  . Alcohol Use: Yes    Review of Systems  Constitutional: Negative for fever and chills.  HENT: Positive for facial swelling (Right side of neck), sore throat, trouble swallowing and voice change. Negative for congestion and ear pain.   Respiratory: Negative for cough and shortness of breath.   Cardiovascular: Negative for chest pain and palpitations.  Gastrointestinal: Negative for nausea, vomiting, abdominal pain and  diarrhea.  Musculoskeletal: Positive for neck pain (Right side). Negative for myalgias, back pain and arthralgias.  Skin: Negative for rash.    Allergies  Review of patient's allergies indicates no known allergies.  Home Medications   Prior to Admission medications   Not on File   Meds Ordered and Administered this Visit   Medications  cefTRIAXone (ROCEPHIN) injection 1 g (1 g Intramuscular Given 05/15/15 1538)  ketorolac (TORADOL) injection 60 mg (60 mg Intramuscular Given 05/15/15 1538)  methylPREDNISolone sodium succinate (SOLU-MEDROL) 40 mg/mL injection 80 mg (80 mg Intramuscular Given 05/15/15 1538)    BP 221/139 mmHg  Pulse 109  Temp(Src) 98.5 F (36.9 C) (Oral)  Resp 20  Ht 6' (1.829 m)  Wt 290 lb 4 oz (131.657 kg)  BMI 39.36 kg/m2  SpO2 95% No data found.   Physical Exam  Constitutional: He is oriented to person, place, and time. He appears well-developed and well-nourished.  Obese male sitting on exam bed, appears well, non-toxic, NAD.   HENT:  Head: Normocephalic and atraumatic.  Right Ear: Hearing, tympanic membrane, external ear and ear canal normal.  Left Ear: Hearing, external ear and ear canal normal. Tympanic membrane is injected. A middle ear effusion is present.  Nose: Nose normal. Right sinus exhibits no maxillary sinus tenderness and no frontal sinus tenderness. Left sinus exhibits no maxillary sinus tenderness and no frontal sinus tenderness.  Mouth/Throat: Mucous membranes are normal. There is trismus in the jaw. Uvula swelling present. Posterior oropharyngeal edema, posterior oropharyngeal erythema and tonsillar abscesses present.  No oropharyngeal exudate.  Mild trismus. Significant swelling to Right tonsil and soft palate. Uvula is shifted to the Left of midline. Pt able to manage secretions.  No drooling.  No bleeding, drainage or exudates.   Eyes: Conjunctivae and EOM are normal. Pupils are equal, round, and reactive to light. No scleral icterus.   Neck: Normal range of motion. Neck supple.  No stridor, mildly muffled voice.   Cardiovascular: Normal rate, regular rhythm and normal heart sounds.   Pulmonary/Chest: Effort normal and breath sounds normal. No stridor. No respiratory distress. He has no wheezes. He has no rales. He exhibits no tenderness.  No respiratory distress. Able to speak in full sentences without difficulty.   Abdominal: Soft. He exhibits no distension. There is no tenderness.  Musculoskeletal: Normal range of motion.  Lymphadenopathy:    He has cervical adenopathy.  Neurological: He is alert and oriented to person, place, and time. He has normal strength. No cranial nerve deficit or sensory deficit. Coordination and gait normal. GCS eye subscore is 4. GCS verbal subscore is 5. GCS motor subscore is 6.  Skin: Skin is warm and dry.  Nursing note and vitals reviewed.   ED Course  Procedures (including critical care time)  Labs Review Labs Reviewed - No data to display  Imaging Review No results found.    MDM   1. Hypertensive urgency   2. Swelling of throat   3. Jaw pain     Pt presenting to [facility] with c/o worsening Right sided neck and throat pain for 1 week.  BP elevated at 185/137 upon arrival to [facility], continued to increase to 221/139 despite resting in exam room and drinking a cold glass of water.  Exam concerning for peritonsillar abscess, however, pt is reluctant to go to emergency department.  He does have mildly muffled voice but is able to manage his secretions and drink a glass of water.  Pt given solumedrol  IM, toradol  IM and rocephin 1g IM. Advised pt he would likely be able to be discharged home with prednisone, antibiotics and f/u in the morning for recheck of symptoms, however, BP continued to be elevated in UC setting.  Discussed concern about pt's blood pressure and concern for kidney damage or stroke.  Strongly encouraged pt go to emergency department for further evaluation of  neck mass and blood pressure.  [facility] called ahead and notified of pt's pending arrival. Pt will be going POV as he declined EMS transport.  Denies headache, dizziness, chest pain, weakness, or SOB.  Pt stable at discharge.       Junius Finner, PA-C 05/15/15 1605

## 2015-05-15 NOTE — ED Provider Notes (Signed)
Medical screening examination/treatment/procedure(s) were conducted as a shared visit with non-physician practitioner(s) and myself.  I personally evaluated the patient during the encounter.   EKG Interpretation None     Patient had been diagnosed with peritonsillar abscess and high blood pressure in urgent care. On exam here, he is handling his secretions. No signs of airway obstructions. Blood pressure treated here with IV labetalol and he'll be placed on Norvasc and give an ENT referral  Lorre Nick, MD 05/15/15 2154

## 2015-05-15 NOTE — ED Notes (Signed)
Patient presents to the [facility] with C/O pain 8/10 in the right jaw and neck area. Problem times 1 week worsening daily. Denies fever. Took some old PCN yesterday

## 2015-05-15 NOTE — ED Notes (Signed)
Patient sent to [facility] for further evaluation and treatment.Hypertension, and abscess in the right neck area. Report given to Darl Pikes charge nurse, reported all Blood pressures, 185/137,191/131, 212/130, 221/139.

## 2015-05-15 NOTE — ED Notes (Signed)
Bed: WA02 Expected date:  Expected time:  Means of arrival:  Comments: Occluded airway

## 2015-05-15 NOTE — ED Provider Notes (Signed)
CSN: [id number]     Arrival date & time 05/15/15  1620 History   First MD Initiated Contact with Patient 05/15/15 1623     Chief Complaint  Patient presents with  . Tonsiller Abscess    . Hypertension     (Consider location/radiation/quality/duration/timing/severity/associated sxs/prior Treatment) Patient is a 55 y.o. male presenting with hypertension. The history is provided by the patient and medical records. No language interpreter was used.  Hypertension Associated symptoms include neck pain and a sore throat. Pertinent negatives include no abdominal pain, chills, congestion, coughing, fever, headaches, nausea, rash, vomiting or weakness.  Ron Beske. is a 55 y.o. male  who was sent from urgent care to Emergency Department for concerns of elevated blood pressure and possible tonsillar abscess. Patient complains of worsening right sided jaw pain and sore throat x 1 week. Denies shortness of breath, no drooling. Per urgent care notes, patient was given  solumedrol,  toradol, and 1g rocephin. Upon my initial examination, patient states he feels much improved and able to swallow much better now after medication given.  BP at presentation to ED was 213/119 - patient states he was told he had high blood pressure back in 2014, was given one month of BP medication, then "my blood pressure problems went away" Was seen by pulmonology, Dr. Maple Hudson, approx. Two weeks ago for sleep apnea and told his BP was "in the 140's" at that time. Denies headache, visual changes, abdominal pain.  Past Medical History  Diagnosis Date  . Essential hypertension, benign 06/19/2012  . Gout 06/19/2012  . Hyperglycemia 06/19/2012    March 2014 A1c 6.5 (repeat needed)    Past Surgical History  Procedure Laterality Date  . Knee surgery     No family history on file. Social History  Substance Use Topics  . Smoking status: Never Smoker   . Smokeless tobacco: None  . Alcohol Use: Yes    Review of Systems   Constitutional: Negative for fever and chills.  HENT: Positive for sore throat and tinnitus. Negative for congestion and drooling.   Eyes: Negative for visual disturbance.  Respiratory: Negative for cough, shortness of breath and wheezing.   Cardiovascular: Negative.   Gastrointestinal: Negative for nausea, vomiting and abdominal pain.  Genitourinary: Negative for dysuria.  Musculoskeletal: Positive for neck pain. Negative for back pain.  Skin: Negative for rash.  Neurological: Negative for dizziness, weakness and headaches.      Allergies  Review of patient's allergies indicates no known allergies.  Home Medications   Prior to Admission medications   Medication Sig Start Date End Date Taking? Authorizing Provider  acetaminophen (TYLENOL) 500 MG tablet Take 1,000 mg by mouth every 6 (six) hours as needed for moderate pain.   Yes Historical Provider, MD  penicillin v potassium (VEETID) 250 MG tablet Take 250 mg by mouth every 4 (four) hours as needed (infection).   Yes Historical Provider, MD  amLODipine (NORVASC) 5 MG tablet Take 1 tablet (5 mg total) by mouth daily. If blood pressure is still elevated in one week, increase to 2 tablets (10 mg total) by mouth daily. 05/15/15   Chase Picket Eowyn Tabone, PA-C  clindamycin (CLEOCIN) 150 MG capsule Take 3 capsules (450 mg total) by mouth 3 (three) times daily. 05/15/15   Chase Picket Narayan Scull, PA-C  oxyCODONE-acetaminophen (PERCOCET/ROXICET) 5-325 MG tablet Take 1 tablet by mouth every 8 (eight) hours as needed for severe pain. 05/15/15   Chase Picket Catera Hankins, PA-C   BP 179/119 mmHg  Pulse  90  Temp(Src) 98.1 F (36.7 C) (Oral)  Resp 18  SpO2 96% Physical Exam  Constitutional: He is oriented to person, place, and time. He appears well-developed and well-nourished.  Alert, speaking in full sentences, and in no acute distress  HENT:  Head: Normocephalic and atraumatic.  OP with erythema. Right tonsillar edema. Uvula deviated left of midline. No  drooling.   Cardiovascular: Normal heart sounds and intact distal pulses.  Exam reveals no gallop and no friction rub.   No murmur heard. Tachy but regular.   Pulmonary/Chest: Effort normal and breath sounds normal. No respiratory distress. He has no wheezes. He has no rales. He exhibits no tenderness.  Abdominal: Soft. Bowel sounds are normal. He exhibits no distension and no mass. There is no tenderness. There is no rebound and no guarding.  Musculoskeletal: He exhibits no edema.  Neurological: He is alert and oriented to person, place, and time.  Skin: Skin is warm and dry. No rash noted.  Psychiatric: He has a normal mood and affect. His behavior is normal. Judgment and thought content normal.  Nursing note and vitals reviewed.   ED Course  Procedures (including critical care time) Labs Review Labs Reviewed  BASIC METABOLIC PANEL - Abnormal; Notable for the following:    Glucose, Bld 107 (*)    All other components within normal limits  CBC WITH DIFFERENTIAL/PLATELET - Abnormal; Notable for the following:    WBC 10.9 (*)    RBC 5.95 (*)    MCV 77.6 (*)    MCH 24.9 (*)    Neutro Abs 9.5 (*)    All other components within normal limits    Imaging Review No results found. I have personally reviewed and evaluated these images and lab results as part of my medical decision-making.   EKG Interpretation None      MDM   Final diagnoses:  Elevated blood pressure  Peritonsillar abscess   Adah Salvage. presents from urgent care for:  1. Likely peritonsillar abscess: on exam right tonsillar swelling with uvula deviated to left. Patient given Solumedrol, Rocephin, and Toradol at urgent care PTA.   2. Hypertensive urgency: BP upon arrival was 213/119. Patient seen by Pulmonology, Dr. Maple Hudson, two weeks prior with BP in 140's per patient. Not currently on blood pressure medication.  IV labetalol given.   Labs: CBC with white count of 10.9; BMP reassuring.   Consults:  ENT, Dr. Jearld Fenton, who recommends starting patient on Clindamycin and following up in office Monday.   Therapeutics: IV fluids, Labetalol  x 2 - 8:34 PM patient re-evaluated, BP improved to 179/119  9:56 PM - Patient tolerating PO, speaking in full sentences, no trouble breathing or swallowing.   A&P: Patient informed to call ENT clinic Monday morning for follow up appointment. Will treat with Clinda per Dr. Jearld Fenton recommendation.  Pt. Has taken Norvasc in the past, will give rx and pt. Told to follow up with PCP for BP check and further eval/mgt.   Patient seen by and discussed with Dr. Freida Busman who agrees with treatment plan.    Chase Picket Brayli Klingbeil, PA-C 05/15/15 2157

## 2015-05-15 NOTE — Discharge Instructions (Signed)
1. Medications: Please take all of your antibiotics until finished!, Norvasc is for your blood pressure, continue usual home medications 2. Treatment: rest, drink plenty of fluids in small sips, soft foods will be easier to swallow.  3. Follow Up: Please call the ENT clinic listed above Monday morning to schedule your follow up appointment. Please see your primary care physician for discussion of about your blood pressure and further evaluation after today's visit; if you do not have a primary care doctor use the resource guide provided to find one; Please return to the ER for new or worsening symptoms, any additional concerns.    Emergency Department Resource Guide 1) Find a Doctor and Pay Out of Pocket Although you won't have to find out who is covered by your insurance plan, it is a good idea to ask around and get recommendations. You will then need to call the office and see if the doctor you have chosen will accept you as a new patient and what types of options they offer for patients who are self-pay. Some doctors offer discounts or will set up payment plans for their patients who do not have insurance, but you will need to ask so you aren't surprised when you get to your appointment.  2) Contact Your Local Health Department Not all health departments have doctors that can see patients for sick visits, but many do, so it is worth a call to see if yours does. If you don't know where your local health department is, you can check in your phone book. The CDC also has a tool to help you locate your state's health department, and many state websites also have listings of all of their local health departments.  3) Find a Walk-in Clinic If your illness is not likely to be very severe or complicated, you may want to try a walk in clinic. These are popping up all over the country in pharmacies, drugstores, and shopping centers. They're usually staffed by nurse practitioners or physician assistants that  have been trained to treat common illnesses and complaints. They're usually fairly quick and inexpensive. However, if you have serious medical issues or chronic medical problems, these are probably not your best option.  No Primary Care Doctor: - Call Health Connect at  [phone] - they can help you locate a primary care doctor that  accepts your insurance, provides certain services, etc. - Physician Referral Service- [phone]  Chronic Pain Problems: Organization         Address  Phone   Notes  [facility]  [phone] Patients need to be referred by their primary care doctor.   Medication Assistance: Organization         Address  Phone   Notes  [facility] [address] [place] [place], [place] [address] [phone] --Must be a resident of [facility] -- Must have NO insurance coverage whatsoever (no Medicaid/ Medicare, etc.) -- The pt. MUST have a primary care doctor that directs their care regularly and follows them in the community   [facility]  [phone]   [employer]  [phone]    Agencies that provide inexpensive medical care: Organization         Address  Phone   Notes  [facility]  [phone]   [facility]    [phone]   [facility] [address] [place], [place] [address] [phone]  [facility] [address] [place] [place] [phone]   [facility]anned Parenthood    [phone]   [facility]    [phone]   [facility] and [facility]  [address], [place] Phone:  [phone], Fax:  [phone] Hours of Operation:  9 am - 6 pm, M-F.  Also accepts Medicaid/Medicare and self-pay.  [facility]  [address], [place] Phone: [phone], Fax: [phone]. Hours of Operation:  8:30 am - 5:30 pm, M-F.  Also accepts Medicaid and  self-pay.  [facility] [place] [address], [place] Phone: [phone]   [facility] [address] Natasha Bence [place], [place] [phone], Ext. [phone] Mondays & Thursdays: 7-9 AM.  First 15 patients are seen on a first come, first serve basis.    Medicaid-accepting [facility] Providers:  Organization         Address  Phone   Notes  [facility] [address], [place] [phone] Also accepts self-pay patients.  [facility] [address] Laurell Josephs [place], [place]  [phone]   [facility] [address], [place] [phone]   [facility] [address], [place] [phone]   Renaye Rakers [address], [place]   [phone] Only accepts [place] patients after they have their name applied to their card.   Self-Pay (no insurance) in [facility]:  Organization         Address  Phone   Notes  Sickle Cell Patients, [facility] [address] [place], [place] [phone]   [facility] [address] [place], [place] [phone]   [facility] [place]  [address], [place] [phone]   [facility]/Dr. Osei-Bonsu  [address], [place] or [address], [place] [phone] Phone number for both [place] and [place] locations is the same.  [facility] and [facility] [address] [place] [phone]   [facility] [address] [place] or [address] [phone]   [facility] [address] [place] [phone], phone; [phone], fax Sees patients 1st and 3rd Saturday of every month.  Must not qualify for public or private insurance (i.e. Medicaid, Medicare, [place] [employer] Choice, Veterans' Benefits)  Household income  should be no more than 200% of the poverty level The clinic cannot treat you if you are pregnant or think you are pregnant  Sexually transmitted diseases are not treated at the clinic.

## 2015-05-27 ENCOUNTER — Telehealth: Payer: Self-pay | Admitting: Internal Medicine

## 2015-05-27 DIAGNOSIS — L439 Lichen planus, unspecified: Secondary | ICD-10-CM | POA: Insufficient documentation

## 2015-05-28 ENCOUNTER — Encounter: Payer: Self-pay | Admitting: Gastroenterology

## 2015-05-28 NOTE — Telephone Encounter (Signed)
Spoke to pt & have him set to come 2/27 at 3:00 to pick up hst machine. Nothing further needed.

## 2015-05-31 DIAGNOSIS — G4733 Obstructive sleep apnea (adult) (pediatric): Secondary | ICD-10-CM | POA: Diagnosis not present

## 2015-06-01 ENCOUNTER — Telehealth: Payer: Self-pay | Admitting: Internal Medicine

## 2015-06-01 NOTE — Telephone Encounter (Signed)
I have scheduled patient on 06/28/15 at 11:15am with CY for follow up of HST results. Please inform patient of new appt date and time-no earlier appts in day available.  Thanks.

## 2015-06-01 NOTE — Telephone Encounter (Signed)
Pt called back and aware of new appt date and time. Nothing further needed at this time.

## 2015-06-09 DIAGNOSIS — G4733 Obstructive sleep apnea (adult) (pediatric): Secondary | ICD-10-CM | POA: Diagnosis not present

## 2015-06-10 ENCOUNTER — Other Ambulatory Visit: Payer: Self-pay | Admitting: *Deleted

## 2015-06-10 DIAGNOSIS — G4733 Obstructive sleep apnea (adult) (pediatric): Secondary | ICD-10-CM

## 2015-06-16 ENCOUNTER — Ambulatory Visit: Payer: BLUE CROSS/BLUE SHIELD | Admitting: Internal Medicine

## 2015-06-28 ENCOUNTER — Encounter: Payer: Self-pay | Admitting: Internal Medicine

## 2015-06-28 ENCOUNTER — Ambulatory Visit (INDEPENDENT_AMBULATORY_CARE_PROVIDER_SITE_OTHER): Payer: BLUE CROSS/BLUE SHIELD | Admitting: Internal Medicine

## 2015-06-28 VITALS — BP 128/72 | HR 91 | Ht 72.0 in | Wt 282.4 lb

## 2015-06-28 DIAGNOSIS — G4733 Obstructive sleep apnea (adult) (pediatric): Secondary | ICD-10-CM | POA: Diagnosis not present

## 2015-06-28 DIAGNOSIS — E669 Obesity, unspecified: Secondary | ICD-10-CM

## 2015-06-28 NOTE — Patient Instructions (Signed)
Order- referral - Orthodontist Dr Althea GrimmerMark Katz    Consider oral appliance for OSA  Please call as needed. If you decide to work with CPAP we can help get that set up.

## 2015-06-28 NOTE — Progress Notes (Signed)
05/05/2015-55 year old male never smoker Self Referral-pt states his wife has noticed he wakes up after he stops breathing during sleep. Pt does not feel he goes into deep sleep. Had sleep study 2005/2006 and was on CPAP-started loosing wieght and came off of it. Now his weight is back up and symptoms are back. Is interested in oral appliance. Remote diagnosis of obstructive sleep apnea in [place] years ago-report not available. He stopped CPAP after he lost weight. Married 6 months ago and has regained his weight. Wife reports witnessed apneas, loud snoring, worse on his back. He is aware of some nasal stuffiness especially supine but also seasonal. Sleep hygiene reviewed. No ENT surgery and no diagnosed heart or lung disorder. 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  06/28/2015-55 year old male never smoker followed for OSA, complicated by HBP, allergic rhinitis Unattended Home Sleep Test 05/31/2015-AHI 68.5/hour with desaturation to 63%. Body weight 210 pounds FOLLOWS FOR: Review HST with patient.  We reviewed his study together, discussed his symptoms and previous experience with CPAP  ROS-see HPI   Negative unless "+" Constitutional:    weight loss, night sweats, fevers, chills, +fatigue, lassitude. HEENT:    headaches, difficulty swallowing, tooth/dental problems, sore throat,       sneezing, itching, ear ache, nasal congestion, post nasal drip, snoring CV:    chest pain, orthopnea, PND, swelling in lower extremities, anasarca,                                                       dizziness, palpitations Resp:   shortness of breath with exertion or at rest.                productive cough,   non-productive cough, coughing up of blood.              change in color of mucus.  wheezing.   Skin:    rash or lesions. GI:  No-   heartburn, indigestion, abdominal pain, nausea, vomiting, diarrhea,                 change in bowel habits, loss of appetite GU:  dysuria, change in color of urine, no urgency or frequency.   flank pain. MS:   joint pain, stiffness, decreased range of motion, back pain. Neuro-     nothing unusual Psych:  change in mood or affect.  depression or anxiety.   memory loss.  OBJ- Physical Exam General- Alert, Oriented, Affect-appropriate, Distress- none acute, + obese Skin- rash-none, lesions- none, excoriation- none Lymphadenopathy- none Head- atraumatic            Eyes- Gross vision intact, PERRLA, conjunctivae and secretions clear            Ears- Hearing, canals-normal            Nose- , no-Septal dev, mucus+, polyps, erosion, perforation             Throat- Mallampati III-IV , mucosa clear , drainage- none, tonsils- atrophic Neck- flexible , trachea midline, no stridor , thyroid nl, carotid no bruit Chest - symmetrical excursion , unlabored           Heart/CV- RRR , no murmur , no gallop  , no rub, nl s1 s2                           -  JVD- none , edema- none, stasis changes- none, varices- none           Lung- clear to P&A, wheeze- none, cough- none , dullness-none, rub- none           Chest wall-  Abd-  Br/ Gen/ Rectal- Not done, not indicated Extrem- cyanosis- none, clubbing, none, atrophy- none, strength- nl Neuro- grossly intact to observation

## 2015-06-29 DIAGNOSIS — E669 Obesity, unspecified: Secondary | ICD-10-CM | POA: Insufficient documentation

## 2015-06-29 NOTE — Assessment & Plan Note (Signed)
Significantly overweight. He has lost before on his own but has not been able to sustain. He may become a bariatric candidate

## 2015-06-29 NOTE — Assessment & Plan Note (Signed)
He has drifted off of CPAP before but has been unable to keep his weight down. He is interested in exploring other options. His AHI is high and he probably will do best with CPAP and more effort at weight loss. We are going to let him get an alternative discussion about oral appliances by referral to Dr. Myrtis SerKatz

## 2015-07-08 ENCOUNTER — Telehealth: Payer: Self-pay | Admitting: Internal Medicine

## 2015-07-08 NOTE — Telephone Encounter (Signed)
Pt was seen on 06/28/15 with the following instructions:  Patient Instructions     Order- referral - Orthodontist Dr Althea GrimmerMark Katz Consider oral appliance for OSA  Please call as needed. If you decide to work with CPAP we can help get that set up.   ---------  Per Referral, awaiting form to be completed by Dr. Maple HudsonYoung which is in his to do.    lmomtcb for pt to advise we are awaiting form to be completed.  Once completed, will send to Dr. Henrietta HooverKatz's office who will contact pt to schedule appt.

## 2015-07-08 NOTE — Telephone Encounter (Signed)
When patient calls back please let him know that we have completed the form and PCC's have faxed over to Dr Myrtis SerKatz. Thanks.

## 2015-07-09 NOTE — Telephone Encounter (Signed)
Pt returned call and was given info below and seemed satisfied and said that he would wait to hear from dr Kathrynn Humblekazt office.Caren GriffinsStanley A Dalton

## 2015-07-12 ENCOUNTER — Ambulatory Visit (AMBULATORY_SURGERY_CENTER): Payer: Self-pay

## 2015-07-12 VITALS — Ht 72.0 in | Wt 275.0 lb

## 2015-07-12 DIAGNOSIS — Z1211 Encounter for screening for malignant neoplasm of colon: Secondary | ICD-10-CM

## 2015-07-12 NOTE — Progress Notes (Signed)
No allergies to eggs or soy No past problems with anesthesia No diet/weight loss meds No home oxygen  Has email and internet; refused emmi 

## 2015-07-26 ENCOUNTER — Encounter: Payer: Self-pay | Admitting: Gastroenterology

## 2015-07-26 ENCOUNTER — Ambulatory Visit (AMBULATORY_SURGERY_CENTER): Payer: BLUE CROSS/BLUE SHIELD | Admitting: Gastroenterology

## 2015-07-26 VITALS — BP 123/90 | HR 69 | Temp 98.6°F | Resp 18 | Ht 72.0 in | Wt 275.0 lb

## 2015-07-26 DIAGNOSIS — Z1211 Encounter for screening for malignant neoplasm of colon: Secondary | ICD-10-CM

## 2015-07-26 MED ORDER — SODIUM CHLORIDE 0.9 % IV SOLN: 500.0000 mL | INTRAVENOUS | Status: DC

## 2015-07-26 NOTE — Op Note (Signed)
[facility] Patient Name: Ernest Navarro Procedure Date: 07/26/2015 8:51 AM MRN: [id number] Endoscopist: Sherilyn Cooter L. Myrtie Neither , MD Age: 55 Date of Birth: 1960/12/02 Gender: Male Procedure:                Colonoscopy Indications:              Screening for colorectal malignant neoplasm Medicines:                Monitored Anesthesia Care Procedure:                Pre-Anesthesia Assessment:                           - Prior to the procedure, a History and Physical                            was performed, and patient medications and                            allergies were reviewed. The patient's tolerance of                            previous anesthesia was also reviewed. The risks                            and benefits of the procedure and the sedation                            options and risks were discussed with the patient.                            All questions were answered, and informed consent                            was obtained. Prior Anticoagulants: The patient has                            taken no previous anticoagulant or antiplatelet                            agents. ASA Grade Assessment: II - A patient with                            mild systemic disease. After reviewing the risks                            and benefits, the patient was deemed in                            satisfactory condition to undergo the procedure.                           After obtaining informed consent, the colonoscope  was passed under direct vision. Throughout the                            procedure, the patient's blood pressure, pulse, and                            oxygen saturations were monitored continuously. The                            Model CF-HQ190L [id number]) scope was introduced                            through the anus and advanced to the the cecum,                            identified by appendiceal orifice and ileocecal                    valve. The colonoscopy was performed without                            difficulty. The patient tolerated the procedure                            well. The quality of the bowel preparation was                            good. The ileocecal valve, appendiceal orifice, and                            rectum were photographed. Scope In: 8:57:31 AM Scope Out: 9:09:15 AM Scope Withdrawal Time: 0 hours 9 minutes 23 seconds  Total Procedure Duration: 0 hours 11 minutes 44 seconds  Findings:                 The perianal and digital rectal examinations were                            normal.                           The entire examined colon appeared normal on direct                            and retroflexion views. Complications:            No immediate complications. Estimated Blood Loss:     Estimated blood loss: none. Impression:               - The entire examined colon is normal on direct and                            retroflexion views.                           - No specimens collected. Recommendation:           - Patient  has a contact number available for                            emergencies. The signs and symptoms of potential                            delayed complications were discussed with the                            patient. Return to normal activities tomorrow.                            Written discharge instructions were provided to the                            patient.                           - Resume previous diet.                           - Continue present medications.                           - Repeat colonoscopy in 10 years for screening                            purposes. Henry L. Myrtie Neitheranis, MD 07/26/2015 9:11:52 AM This report has been signed electronically.

## 2015-07-26 NOTE — Progress Notes (Signed)
A/ox3 pleased with MAC, report to Jane RN 

## 2015-07-26 NOTE — Patient Instructions (Signed)

## 2015-07-27 ENCOUNTER — Telehealth: Payer: Self-pay | Admitting: *Deleted

## 2015-07-27 NOTE — Telephone Encounter (Signed)
  Follow up Call-  Call back number 07/26/2015  Post procedure Call Back phone  # [phone]  Permission to leave phone message Yes     Patient questions:  Do you have a fever, pain , or abdominal swelling? No. Pain Score  0 *  Have you tolerated food without any problems? Yes.    Have you been able to return to your normal activities? Yes.    Do you have any questions about your discharge instructions: Diet   No. Medications  No. Follow up visit  No.  Do you have questions or concerns about your Care? No.  Actions: * If pain score is 4 or above: No action needed, pain <4.

## 2017-05-25 DIAGNOSIS — N528 Other male erectile dysfunction: Secondary | ICD-10-CM

## 2017-05-25 HISTORY — DX: Other male erectile dysfunction: N52.8

## 2017-09-28 ENCOUNTER — Ambulatory Visit (INDEPENDENT_AMBULATORY_CARE_PROVIDER_SITE_OTHER): Payer: BLUE CROSS/BLUE SHIELD

## 2017-09-28 ENCOUNTER — Ambulatory Visit (INDEPENDENT_AMBULATORY_CARE_PROVIDER_SITE_OTHER): Payer: BLUE CROSS/BLUE SHIELD | Admitting: Orthopedic Surgery

## 2017-09-28 DIAGNOSIS — M25561 Pain in right knee: Secondary | ICD-10-CM

## 2017-09-28 DIAGNOSIS — M1711 Unilateral primary osteoarthritis, right knee: Secondary | ICD-10-CM | POA: Diagnosis not present

## 2017-09-29 ENCOUNTER — Encounter (INDEPENDENT_AMBULATORY_CARE_PROVIDER_SITE_OTHER): Payer: Self-pay | Admitting: Orthopedic Surgery

## 2017-09-29 DIAGNOSIS — M1711 Unilateral primary osteoarthritis, right knee: Secondary | ICD-10-CM | POA: Diagnosis not present

## 2017-09-29 MED ORDER — METHYLPREDNISOLONE ACETATE 40 MG/ML IJ SUSP
40.0000 mg | INTRAMUSCULAR | Status: AC | PRN
  Administered 2017-09-29: 40 mg via INTRA_ARTICULAR

## 2017-09-29 MED ORDER — BUPIVACAINE HCL 0.25 % IJ SOLN
4.0000 mL | INTRAMUSCULAR | Status: AC | PRN
  Administered 2017-09-29: 4 mL via INTRA_ARTICULAR

## 2017-09-29 MED ORDER — LIDOCAINE HCL 1 % IJ SOLN
5.0000 mL | INTRAMUSCULAR | Status: AC | PRN
Start: 2017-09-29 — End: 2017-09-29
  Administered 2017-09-29: 5 mL

## 2017-09-29 NOTE — Progress Notes (Signed)
Office Visit Note   Patient: Ernest CrockerFloyd D Hidrogo Jr.           Date of Birth: Jan 17, 1961           MRN: [id number] Visit Date: 09/28/2017 Requested by: Verlon AuBoyd, Tammy Lamonica, MD [address] [place], [place] [id number] PCP: Verlon AuBoyd, Tammy Lamonica, MD  Subjective: Chief Complaint  Patient presents with  . Right Knee - Pain    HPI: Ernest Navarro is a patient with right knee pain.  He has had pain in the knee for years.  Had an injury in high school to his knee.  He has been very stable with the right knee but a few weeks ago he had what sounds like pain from loose body moving around in the knee.  Had swelling since that time.  He has had an injection in the past which helped.  He is tried diclofenac and Tylenol which has not been helpful.  He was here in 2015.              ROS: All systems reviewed are negative as they relate to the chief complaint within the history of present illness.  Patient denies  fevers or chills.   Assessment & Plan: Visit Diagnoses:  1. Right knee pain, unspecified chronicity   2. Unilateral primary osteoarthritis, right knee     Plan: Impression is arthritic flare in a patient who has end-stage arthritis in the right knee.  Plan is to continue with anti-inflammatories and Tylenol as needed.  Aspiration and injection of the knee is performed today to try to press the reset button on the knee pain and swelling.  He will need to get knee replacement at some time in the future but at this time he is managing fairly well with the amount of arthritis that he has.  Follow-Up Instructions: Return if symptoms worsen or fail to improve.   Orders:  Orders Placed This Encounter  Procedures  . XR KNEE 3 VIEW RIGHT   No orders of the defined types were placed in this encounter.     Procedures: Large Joint Inj: R knee on 09/29/2017 10:01 AM Indications: diagnostic evaluation, joint swelling and pain Details: 18 G 1.5 in needle, superolateral  approach  Arthrogram: No  Medications: 5 mL lidocaine 1 %; 40 mg methylPREDNISolone acetate 40 MG/ML; 4 mL bupivacaine 0.25 % Outcome: tolerated well, no immediate complications Procedure, treatment alternatives, risks and benefits explained, specific risks discussed. Consent was given by the patient. Immediately prior to procedure a time out was called to verify the correct patient, procedure, equipment, support staff and site/side marked as required. Patient was prepped and draped in the usual sterile fashion.       Clinical Data: No additional findings.  Objective: Vital Signs: There were no vitals taken for this visit.  Physical Exam:   Constitutional: Patient appears well-developed HEENT:  Head: Normocephalic Eyes:EOM are normal Neck: Normal range of motion Cardiovascular: Normal rate Pulmonary/chest: Effort normal Neurologic: Patient is alert Skin: Skin is warm Psychiatric: Patient has normal mood and affect    Ortho Exam: Orthopedic exam demonstrates normal gait alignment.  He is Palpable pedal pulses.  About a 5 degree flexion contracture in the right knee with flexion to around 115.  Collateral and cruciate ligaments are stable.  Mild effusion is present in the right knee.  Extensor mechanism is intact.  Multiple healed incisions are present around the right knee consistent with his prior surgery.  Pedal  pulses palpable.  Extensor mechanism intact.  Specialty Comments:  No specialty comments available.  Imaging: Xr Knee 3 View Right  Result Date: 09/29/2017 AP lateral merchant right knee reviewed.  End-stage tricompartmental arthritis is present.  Loose bodies present both in the posterior medial compartment as well as suprapatellar compartment.  No fracture or dislocation is present.  Overall alignment is neutral.  Left knee has early medial compartment arthritis as well.    PMFS History: Patient Active Problem List   Diagnosis Date Noted  . Obesity  06/29/2015  . Obstructive sleep apnea 05/06/2015  . Seasonal and perennial allergic rhinitis 05/06/2015  . Hyperglycemia 06/19/2012  . Essential hypertension, benign 06/19/2012  . Gout 06/19/2012  . Nocturia 06/19/2012   Past Medical History:  Diagnosis Date  . Essential hypertension, benign 06/19/2012  . Gout 06/19/2012  . Hyperglycemia 06/19/2012   March 2014 A1c 6.5 (repeat needed)     Family History  Problem Relation Age of Onset  . Colon cancer Neg Hx     Past Surgical History:  Procedure Laterality Date  . KNEE SURGERY     Social History   Occupational History  . Not on file  Tobacco Use  . Smoking status: Never Smoker  . Smokeless tobacco: Never Used  Substance and Sexual Activity  . Alcohol use: No    Alcohol/week: 0.0 oz  . Drug use: No  . Sexual activity: Not on file

## 2018-11-13 DIAGNOSIS — E66813 Obesity, class 3: Secondary | ICD-10-CM

## 2018-11-13 HISTORY — DX: Obesity, class 3: E66.813

## 2019-05-13 DIAGNOSIS — E349 Endocrine disorder, unspecified: Secondary | ICD-10-CM | POA: Insufficient documentation

## 2019-05-15 DIAGNOSIS — E119 Type 2 diabetes mellitus without complications: Secondary | ICD-10-CM | POA: Insufficient documentation

## 2019-05-15 HISTORY — DX: Type 2 diabetes mellitus without complications: E11.9

## 2019-12-11 ENCOUNTER — Telehealth: Payer: Self-pay

## 2019-12-11 NOTE — Telephone Encounter (Signed)
Last appointment in 2019. Do not want to RX narcotics when he hasn't been seen recently.  Needs appointment for re-evaluation prior to prescriptions

## 2019-12-11 NOTE — Telephone Encounter (Signed)
Please advise 

## 2019-12-11 NOTE — Telephone Encounter (Signed)
Franky Macho said they haven't been seen since 2019, would need appt before refill

## 2019-12-11 NOTE — Telephone Encounter (Signed)
Patient called he wants to get a refill on Tramadol he is requesting [facility] address is  [address] [place] [address]

## 2019-12-24 ENCOUNTER — Telehealth: Payer: Self-pay

## 2019-12-24 ENCOUNTER — Ambulatory Visit: Payer: Self-pay

## 2019-12-24 ENCOUNTER — Ambulatory Visit (INDEPENDENT_AMBULATORY_CARE_PROVIDER_SITE_OTHER): Payer: BLUE CROSS/BLUE SHIELD | Admitting: Orthopedic Surgery

## 2019-12-24 ENCOUNTER — Encounter: Payer: Self-pay | Admitting: Orthopedic Surgery

## 2019-12-24 ENCOUNTER — Other Ambulatory Visit: Payer: Self-pay | Admitting: Surgical

## 2019-12-24 ENCOUNTER — Ambulatory Visit: Admitting: Orthopedic Surgery

## 2019-12-24 VITALS — Ht 72.0 in | Wt 305.0 lb

## 2019-12-24 DIAGNOSIS — M25561 Pain in right knee: Secondary | ICD-10-CM

## 2019-12-24 DIAGNOSIS — M170 Bilateral primary osteoarthritis of knee: Secondary | ICD-10-CM

## 2019-12-24 DIAGNOSIS — M25562 Pain in left knee: Secondary | ICD-10-CM | POA: Diagnosis not present

## 2019-12-24 MED ORDER — BUPIVACAINE HCL 0.25 % IJ SOLN
4.0000 mL | INTRAMUSCULAR | Status: AC | PRN
  Administered 2019-12-24: 4 mL via INTRA_ARTICULAR

## 2019-12-24 MED ORDER — METHYLPREDNISOLONE ACETATE 40 MG/ML IJ SUSP
40.0000 mg | INTRAMUSCULAR | Status: AC | PRN
Start: 2019-12-24 — End: 2019-12-24
  Administered 2019-12-24: 40 mg via INTRA_ARTICULAR

## 2019-12-24 MED ORDER — TRAMADOL HCL 50 MG PO TABS: ORAL_TABLET | ORAL | 0 refills | Status: DC

## 2019-12-24 MED ORDER — LIDOCAINE HCL 1 % IJ SOLN
5.0000 mL | INTRAMUSCULAR | Status: AC | PRN
Start: 2019-12-24 — End: 2019-12-24
  Administered 2019-12-24: 5 mL

## 2019-12-24 MED ORDER — LIDOCAINE HCL 1 % IJ SOLN
5.0000 mL | INTRAMUSCULAR | Status: AC | PRN
  Administered 2019-12-24: 5 mL

## 2019-12-24 NOTE — Telephone Encounter (Signed)
Patient called in saying prescription was sent to wrong pharmacy . Needs to [facility] to [facility] on [address]

## 2019-12-24 NOTE — Telephone Encounter (Signed)
Can you please resubmit rx from today (tramadol) to correct pharmacy listed below. Thanks.

## 2019-12-24 NOTE — Progress Notes (Signed)
Office Visit Note   Patient: Ernest Navarro.           Date of Birth: 22-Jul-1960           MRN: [id number] Visit Date: 12/24/2019 Requested by: Verlon Au, MD [address] [place],  [place] [address] PCP: Verlon Au, MD  Subjective: Chief Complaint  Patient presents with  . Right Knee - Pain  . Left Knee - Pain    HPI: Ernest Navarro is a 59 year old patient with bilateral knee pain right worse than left initially but now right equals left.  He reports swelling in both knees.  Reports weakness and giving way.  The pain wakes him from sleep at night.  Describes some locking and popping symptoms particular on the left-hand side medially.  Reports catching and difficulty going up and down stairs.  He has had 3 surgeries on his right knee.  Hard for him to walk more than 200 to 300 yards.  Reports both pain and aching in the knees.  Denies any groin pain or radicular symptoms.  Cannot take anti-inflammatories because of blood pressure issues.  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              ROS: All systems reviewed are negative as they relate to the chief complaint within the history of present illness.  Patient denies  fevers or chills.   Assessment & Plan: Visit Diagnoses:  1. Pain in both knees, unspecified chronicity     Plan: Impression is bilateral knee arthritis more severe radiographically on the right than the left.  Plan is to aspirate and inject both knees.  Essentially Ernest Navarro would like to have a reset button pushed to get him back to where he was before his right knee started hurting several weeks ago.  I think he will need knee replacement sometime in the future.  Tramadol prescribed.  Follow-up as needed.  Nonweightbearing quad strengthening exercises for weight loss encouraged.  Follow-Up Instructions: Return if symptoms worsen or fail to improve.   Orders:  Orders Placed This Encounter  Procedures  . XR Knee  1-2 Views Right  . XR KNEE 3 VIEW LEFT   Meds ordered this encounter  Medications  . traMADol (ULTRAM) 50 MG tablet    Sig: 1 po q 12hrs prn pain    Dispense:  30 tablet    Refill:  0      Procedures: Large Joint Inj: bilateral knee on 12/24/2019 8:21 PM Indications: diagnostic evaluation, joint swelling and pain Details: 18 G 1.5 in needle, superolateral approach  Arthrogram: No  Medications (Right): 5 mL lidocaine 1 %; 4 mL bupivacaine 0.25 %; 40 mg methylPREDNISolone acetate 40 MG/ML Medications (Left): 5 mL lidocaine 1 %; 4 mL bupivacaine 0.25 %; 40 mg methylPREDNISolone acetate 40 MG/ML Outcome: tolerated well, no immediate complications Procedure, treatment alternatives, risks and benefits explained, specific risks discussed. Consent was given by the patient. Immediately prior to procedure a time out was called to verify the correct patient, procedure, equipment, support staff and site/side marked as required. Patient was prepped and draped in the usual sterile fashion.       Clinical Data: No additional findings.  Objective: Vital Signs: Ht 6' (1.829 m)   Wt (!) 305 lb (138.3 kg)   BMI 41.37 kg/m   Physical Exam:   Constitutional: Patient appears well-developed HEENT:  Head: Normocephalic Eyes:EOM are normal Neck: Normal  range of motion Cardiovascular: Normal rate Pulmonary/chest: Effort normal Neurologic: Patient is alert Skin: Skin is warm Psychiatric: Patient has normal mood and affect    Ortho Exam: Ortho exam demonstrates slight valgus alignment right knee.  Collateral crucial ligaments are stable.  Extensor mechanism intact bilaterally.  No definite flexion contracture in either knee but does have medial greater than lateral sided pain.  Collateral cruciate ligaments are stable.  No masses lymphadenopathy or skin changes noted in either knee region but there is a trace effusion in the right knee.  Specialty Comments:  No specialty comments  available.  Imaging: No results found.   PMFS History: Patient Active Problem List   Diagnosis Date Noted  . Obesity 06/29/2015  . Obstructive sleep apnea 05/06/2015  . Seasonal and perennial allergic rhinitis 05/06/2015  . Hyperglycemia 06/19/2012  . Essential hypertension, benign 06/19/2012  . Gout 06/19/2012  . Nocturia 06/19/2012   Past Medical History:  Diagnosis Date  . Essential hypertension, benign 06/19/2012  . Gout 06/19/2012  . Hyperglycemia 06/19/2012   March 2014 A1c 6.5 (repeat needed)     Family History  Problem Relation Age of Onset  . Colon cancer Neg Hx     Past Surgical History:  Procedure Laterality Date  . KNEE SURGERY     Social History   Occupational History  . Not on file  Tobacco Use  . Smoking status: Never Smoker  . Smokeless tobacco: Never Used  Substance and Sexual Activity  . Alcohol use: No    Alcohol/week: 0.0 standard drinks  . Drug use: No  . Sexual activity: Not on file

## 2020-01-06 ENCOUNTER — Emergency Department (HOSPITAL_BASED_OUTPATIENT_CLINIC_OR_DEPARTMENT_OTHER): Payer: BC Managed Care – PPO

## 2020-01-06 ENCOUNTER — Other Ambulatory Visit: Payer: Self-pay

## 2020-01-06 ENCOUNTER — Encounter (HOSPITAL_BASED_OUTPATIENT_CLINIC_OR_DEPARTMENT_OTHER): Payer: Self-pay | Admitting: Emergency Medicine

## 2020-01-06 ENCOUNTER — Emergency Department (HOSPITAL_BASED_OUTPATIENT_CLINIC_OR_DEPARTMENT_OTHER)
Admission: EM | Admit: 2020-01-06 | Discharge: 2020-01-06 | Disposition: A | Discharge status: Home / Self Care | Payer: BC Managed Care – PPO | Attending: Emergency Medicine | Admitting: Emergency Medicine

## 2020-01-06 DIAGNOSIS — X501XXA Overexertion from prolonged static or awkward postures, initial encounter: Secondary | ICD-10-CM | POA: Insufficient documentation

## 2020-01-06 DIAGNOSIS — S79912A Unspecified injury of left hip, initial encounter: Secondary | ICD-10-CM | POA: Diagnosis present

## 2020-01-06 DIAGNOSIS — S76012A Strain of muscle, fascia and tendon of left hip, initial encounter: Secondary | ICD-10-CM

## 2020-01-06 DIAGNOSIS — Z79899 Other long term (current) drug therapy: Secondary | ICD-10-CM | POA: Diagnosis not present

## 2020-01-06 DIAGNOSIS — M25552 Pain in left hip: Secondary | ICD-10-CM

## 2020-01-06 DIAGNOSIS — I10 Essential (primary) hypertension: Secondary | ICD-10-CM | POA: Insufficient documentation

## 2020-01-06 DIAGNOSIS — Z7984 Long term (current) use of oral hypoglycemic drugs: Secondary | ICD-10-CM | POA: Diagnosis not present

## 2020-01-06 MED ORDER — METHOCARBAMOL 500 MG PO TABS: 500.0000 mg | ORAL_TABLET | Freq: Three times a day (TID) | ORAL | 0 refills | Status: DC | PRN

## 2020-01-06 MED ORDER — OXYCODONE HCL 5 MG PO CAPS: 5.0000 mg | ORAL_CAPSULE | Freq: Four times a day (QID) | ORAL | 0 refills | Status: AC | PRN

## 2020-01-06 MED ORDER — KETOROLAC TROMETHAMINE 60 MG/2ML IM SOLN
30.0000 mg | Freq: Once | INTRAMUSCULAR | Status: AC
  Administered 2020-01-06: 30 mg via INTRAMUSCULAR
  Filled 2020-01-06: qty 2

## 2020-01-06 NOTE — ED Provider Notes (Signed)
[facility] EMERGENCY DEPARTMENT Provider Note   CSN: [id number] Arrival date & time: 01/06/20  4854     History Chief Complaint  Patient presents with  . Hip Pain    Ernest Mcnab. is a 59 y.o. male.  Presents to ER with concern for hip pain.  Patient reports that he has a history of some chronic knee pain and is followed by orthopedics but normally does not have any hip pain, did have some hip/back pain many years ago.  Was able to golf over the weekend without any major issues.  He does not recall any traumatic events.  Since yesterday he has had steadily worsening left hip pain radiates down his left leg.  No back pain.  Also having some intermittent right hip pain.  No numbness or weakness, no bladder or bowel incontinence, no fevers.  Is able to range his leg without significant difficulty.  No pain in abdomen, no pain in inguinal region, no pain in testicles or swelling.  Reports history of hypertension, occasional gout episodes, denies any other chronic medical conditions.  Sees Dr. August Saucer with orthopedics.  HPI     Past Medical History:  Diagnosis Date  . Essential hypertension, benign 06/19/2012  . Gout 06/19/2012  . Hyperglycemia 06/19/2012   March 2014 A1c 6.5 (repeat needed)     Patient Active Problem List   Diagnosis Date Noted  . Obesity 06/29/2015  . Obstructive sleep apnea 05/06/2015  . Seasonal and perennial allergic rhinitis 05/06/2015  . Hyperglycemia 06/19/2012  . Essential hypertension, benign 06/19/2012  . Gout 06/19/2012  . Nocturia 06/19/2012    Past Surgical History:  Procedure Laterality Date  . KNEE SURGERY         Family History  Problem Relation Age of Onset  . Colon cancer Neg Hx     Social History   Tobacco Use  . Smoking status: Never Smoker  . Smokeless tobacco: Never Used  Vaping Use  . Vaping Use: Never used  Substance Use Topics  . Alcohol use: No    Alcohol/week: 0.0 standard drinks  . Drug use: No     Home Medications Prior to Admission medications   Medication Sig Start Date End Date Taking? Authorizing Provider  acetaminophen (TYLENOL) 500 MG tablet Take 1,000 mg by mouth every 6 (six) hours as needed for moderate pain.   Yes [provider]  amLODipine (NORVASC) 10 MG tablet Take 10 mg by mouth daily. 05/27/15  Yes [provider]  metFORMIN (GLUCOPHAGE) 500 MG tablet Take 500 mg by mouth daily with breakfast.   Yes [provider]  traMADol (ULTRAM) 50 MG tablet 1 po q 12hrs prn pain 12/24/19  Yes Magnant, Charles L, PA-C  losartan (COZAAR) 50 MG tablet Take 50 mg by mouth daily. 06/24/15   [provider]  methocarbamol (ROBAXIN) 500 MG tablet Take 1 tablet (500 mg total) by mouth every 8 (eight) hours as needed for muscle spasms. 01/06/20   Milagros Loll, MD  oxycodone (OXY-IR) 5 MG capsule Take 1 capsule (5 mg total) by mouth every 6 (six) hours as needed for up to 3 days. 01/06/20 01/09/20  Milagros Loll, MD    Allergies    Patient has no known allergies.  Review of Systems   Review of Systems  Constitutional: Negative for chills and fever.  HENT: Negative for ear pain and sore throat.   Eyes: Negative for pain and visual disturbance.  Respiratory: Negative for cough  and shortness of breath.   Cardiovascular: Negative for chest pain and palpitations.  Gastrointestinal: Negative for abdominal pain and vomiting.  Genitourinary: Negative for dysuria and hematuria.  Musculoskeletal: Positive for arthralgias. Negative for back pain.  Skin: Negative for color change and rash.  Neurological: Negative for seizures and syncope.  All other systems reviewed and are negative.   Physical Exam Updated Vital Signs BP (!) 147/88 (BP Location: Right Arm)   Pulse 77   Temp 98.2 F (36.8 C) (Oral)   Resp 16   Ht 6' (1.829 m)   Wt 135.6 kg   SpO2 99%   BMI 40.55 kg/m   Physical Exam Vitals and nursing note reviewed.  Constitutional:       Appearance: He is well-developed.  HENT:     Head: Normocephalic and atraumatic.  Eyes:     Conjunctiva/sclera: Conjunctivae normal.  Cardiovascular:     Rate and Rhythm: Normal rate and regular rhythm.     Heart sounds: No murmur heard.   Pulmonary:     Effort: Pulmonary effort is normal. No respiratory distress.     Breath sounds: Normal breath sounds.  Abdominal:     Palpations: Abdomen is soft.     Tenderness: There is no abdominal tenderness.  Musculoskeletal:     Cervical back: Neck supple.     Comments: Left lower extremity: There is some tenderness over the left hip, he has normal passive range of motion, no tenderness to palpation throughout remainder of extremity, DP/PT pulses intact RLE: no TTP throughout  Skin:    General: Skin is warm and dry.  Neurological:     Mental Status: He is alert.     Comments: 5/5 strength in b/l LE, sensation to light touch in b/l LE     ED Results / Procedures / Treatments   Labs (all labs ordered are listed, but only abnormal results are displayed) Labs Reviewed - No data to display  EKG None  Radiology DG Hip Unilat W or Wo Pelvis 2-3 Views Left  Result Date: 01/06/2020 CLINICAL DATA:  Pain EXAM: DG HIP (WITH OR WITHOUT PELVIS) 2-3V LEFT COMPARISON:  None. FINDINGS: Frontal pelvis as well as frontal and lateral left hip images were obtained. No fracture or dislocation. There is slight symmetric narrowing of each hip joint. No erosive changes. Sacroiliac joints appear normal bilaterally. IMPRESSION: Mild symmetric narrowing of each hip joint. No fracture or dislocation. Electronically Signed   By: Bretta Bang III M.D.   On: 01/06/2020 07:56    Procedures Procedures (including critical care time)  Medications Ordered in ED Medications  ketorolac (TORADOL) injection 30 mg (30 mg Intramuscular Given 01/06/20 0731)    ED Course  I have reviewed the triage vital signs and the nursing notes.  Pertinent labs & imaging  results that were available during my care of the patient were reviewed by me and considered in my medical decision making (see chart for details).    MDM Rules/Calculators/A&P                         59 year old male presenting to ER with left hip pain.  Atraumatic.  Physical exam, noted some tenderness over his left hip but no limited range of motion, no fever, doubt septic joint.  Plain films negative.  Recommend he follow-up with his primary doctor regarding the symptoms.  Neurovascularly intact.  Provided Rx for muscle relaxer and very short Rx for oxycodone intended for  breakthrough pain.  Reviewed return precautions and discharged home.   After the discussed management above, the patient was determined to be safe for discharge.  The patient was in agreement with this plan and all questions regarding their care were answered.  ED return precautions were discussed and the patient will return to the ED with any significant worsening of condition.  Final Clinical Impression(s) / ED Diagnoses Final diagnoses:  Left hip pain  Strain of left hip, initial encounter    Rx / DC Orders ED Discharge Orders         Ordered    oxycodone (OXY-IR) 5 MG capsule  Every 6 hours PRN        01/06/20 0807    methocarbamol (ROBAXIN) 500 MG tablet  Every 8 hours PRN        01/06/20 0807           Milagros Loll, MD 01/06/20 1031

## 2020-01-06 NOTE — Discharge Instructions (Signed)
Please follow-up with with your orthopedic surgeon, Dr. August Saucer to discuss your hip pain.  If you develop uncontrolled pain, inability to bear weight, fever, numbness, weakness, return to ER for reassessment.  Recommend taking Tylenol and Motrin for pain control.  For breakthrough pain, can take the prescribed oxycodone.  Note this can make you drowsy shopping or driving or operating heavy machinery.  You should not combine the oxycodone and tramadol.

## 2020-01-06 NOTE — ED Triage Notes (Addendum)
Pt is c/o left hip and groin pain that started yesterday  Pt states the pain goes down the front of his leg down to just above his knee  Denies injury  Pt states the pain is worse in the hip joint

## 2020-06-15 ENCOUNTER — Telehealth: Payer: Self-pay

## 2020-06-15 NOTE — Telephone Encounter (Signed)
Ok for this? 

## 2020-06-15 NOTE — Telephone Encounter (Signed)
Dr Dean's patient

## 2020-06-15 NOTE — Telephone Encounter (Signed)
Sure thx

## 2020-06-15 NOTE — Telephone Encounter (Signed)
Pt called asking if he can have a RX for knee braces ?

## 2020-06-16 NOTE — Telephone Encounter (Signed)
Put up front for patient to pick up.  

## 2020-06-30 ENCOUNTER — Ambulatory Visit: Payer: BC Managed Care – PPO | Admitting: Orthopedic Surgery

## 2020-06-30 ENCOUNTER — Other Ambulatory Visit: Payer: Self-pay

## 2020-06-30 DIAGNOSIS — M170 Bilateral primary osteoarthritis of knee: Secondary | ICD-10-CM

## 2020-06-30 DIAGNOSIS — M1712 Unilateral primary osteoarthritis, left knee: Secondary | ICD-10-CM

## 2020-06-30 DIAGNOSIS — M1711 Unilateral primary osteoarthritis, right knee: Secondary | ICD-10-CM

## 2020-06-30 MED ORDER — TRAMADOL HCL 50 MG PO TABS: ORAL_TABLET | ORAL | 0 refills | Status: DC

## 2020-06-30 MED ORDER — MELOXICAM 15 MG PO TABS: 15.0000 mg | ORAL_TABLET | Freq: Every day | ORAL | 0 refills | Status: DC

## 2020-07-06 ENCOUNTER — Encounter: Payer: Self-pay | Admitting: Orthopedic Surgery

## 2020-07-06 MED ORDER — BUPIVACAINE HCL 0.25 % IJ SOLN
4.0000 mL | INTRAMUSCULAR | Status: AC | PRN
  Administered 2020-06-30: 4 mL via INTRA_ARTICULAR

## 2020-07-06 MED ORDER — BETAMETHASONE SOD PHOS & ACET 6 (3-3) MG/ML IJ SUSP
6.0000 mg | INTRAMUSCULAR | Status: AC | PRN
  Administered 2020-06-30: 6 mg via INTRA_ARTICULAR

## 2020-07-06 MED ORDER — LIDOCAINE HCL 1 % IJ SOLN
5.0000 mL | INTRAMUSCULAR | Status: AC | PRN
  Administered 2020-06-30: 5 mL

## 2020-07-06 NOTE — Progress Notes (Signed)
Office Visit Note   Patient: Ernest Navarro.           Date of Birth: [birth year]           MRN: [id number] Visit Date: 06/30/2020 Requested by: No referring provider defined for this encounter. PCP: Patient, No Pcp Per (Inactive)  Subjective: Chief Complaint  Patient presents with  . Left Knee - Pain  . Right Knee - Pain    HPI: Pilot is a 60 year old patient with bilateral knee pain right equivalent to left.  Right knee hurts him a little bit more than the left knee and has slightly more deformity.  He has had prior aspiration and injection with good relief.  The pain does wake him from sleep at night.  Reports weakness and giving way along with popping.  Works at [employer].  Best time for him to have knee replacement surgery is after the second week of May due to work projects.  Aspiration and injection 6 months ago did help him.  He was using some tramadol before.  Describes decreased quality of life and this is affecting his recreational golf.  He also reports decreased participation in general life activities due to the pain.  Patient is prediabetic with hypertension and has a body mass index of 39.  He does live alone and has 20 steps.  He will likely require inpatient total knee replacement with skilled nursing facility placement for 1 to 2 weeks thereafter.              ROS: All systems reviewed are negative as they relate to the chief complaint within the history of present illness.  Patient denies  fevers or chills.   Assessment & Plan: Visit Diagnoses:  1. Primary osteoarthritis of both knees     Plan: Impression is end-stage bilateral knee arthritis with right knee being more symptomatic with slightly more deformity.  Patient would like to have both knees aspirated and injected today for pain relief which was performed.  We need to put off his knee replacement 2-1/2 to 3 months after these aspirations and injections and he understands that.  Risk and benefits of knee  replacement are discussed including not limited to infection nerve vessel damage knee stiffness incomplete pain relief as well as potential need for revision in his lifetime.  Would attempt to do press-fit knee replacement on this patient.  All questions answered.  No personal or family history of DVT or pulmonary embolism.  Follow-Up Instructions: No follow-ups on file.   Orders:  No orders of the defined types were placed in this encounter.  Meds ordered this encounter  Medications  . traMADol (ULTRAM) 50 MG tablet    Sig: 1 po q 12hrs prn pain    Dispense:  30 tablet    Refill:  0  . meloxicam (MOBIC) 15 MG tablet    Sig: Take 1 tablet (15 mg total) by mouth daily.    Dispense:  30 tablet    Refill:  0      Procedures: Large Joint Inj: bilateral knee on 06/30/2020 10:31 PM Indications: diagnostic evaluation, joint swelling and pain Details: 18 G 1.5 in needle, superolateral approach  Arthrogram: No  Medications (Right): 5 mL lidocaine 1 %; 4 mL bupivacaine 0.25 %; 6 mg betamethasone acetate-betamethasone sodium phosphate 6 (3-3) MG/ML Medications (Left): 5 mL lidocaine 1 %; 4 mL bupivacaine 0.25 %; 6 mg betamethasone acetate-betamethasone sodium phosphate 6 (3-3) MG/ML Outcome: tolerated well, no immediate complications  Procedure, treatment alternatives, risks and benefits explained, specific risks discussed. Consent was given by the patient. Immediately prior to procedure a time out was called to verify the correct patient, procedure, equipment, support staff and site/side marked as required. Patient was prepped and draped in the usual sterile fashion.       Clinical Data: No additional findings.  Objective: Vital Signs: There were no vitals taken for this visit.  Physical Exam:   Constitutional: Patient appears well-developed HEENT:  Head: Normocephalic Eyes:EOM are normal Neck: Normal range of motion Cardiovascular: Normal rate Pulmonary/chest: Effort  normal Neurologic: Patient is alert Skin: Skin is warm Psychiatric: Patient has normal mood and affect    Ortho Exam: Ortho exam demonstrates full active and passive range of motion of the ankles and hips.  On that right-hand side he has slight valgus alignment no malalignment on the left-hand side.  Trace effusion of bilateral knees.  Range of motion 0 to about 120 in both knees with good stability to varus valgus stress.  Pedal pulses palpable bilaterally ankle dorsiflexion intact bilaterally.  Skin is intact in bilateral knee regions.  Specialty Comments:  No specialty comments available.  Imaging: No results found.   PMFS History: Patient Active Problem List   Diagnosis Date Noted  . Obesity 06/29/2015  . Obstructive sleep apnea 05/06/2015  . Seasonal and perennial allergic rhinitis 05/06/2015  . Hyperglycemia 06/19/2012  . Essential hypertension, benign 06/19/2012  . Gout 06/19/2012  . Nocturia 06/19/2012   Past Medical History:  Diagnosis Date  . Essential hypertension, benign 06/19/2012  . Gout 06/19/2012  . Hyperglycemia 06/19/2012   March 2014 A1c 6.5 (repeat needed)     Family History  Problem Relation Age of Onset  . Colon cancer Neg Hx     Past Surgical History:  Procedure Laterality Date  . KNEE SURGERY     Social History   Occupational History  . Not on file  Tobacco Use  . Smoking status: Never Smoker  . Smokeless tobacco: Never Used  Vaping Use  . Vaping Use: Never used  Substance and Sexual Activity  . Alcohol use: No    Alcohol/week: 0.0 standard drinks  . Drug use: No  . Sexual activity: Not on file

## 2020-07-27 ENCOUNTER — Other Ambulatory Visit: Payer: Self-pay | Admitting: Orthopedic Surgery

## 2020-07-27 NOTE — Telephone Encounter (Signed)
See below. Please advise.  

## 2020-08-25 ENCOUNTER — Telehealth: Payer: Self-pay | Admitting: Orthopedic Surgery

## 2020-08-25 NOTE — Telephone Encounter (Signed)
Pt called and has stated he has left messages but has not received a call back. Pt just wants to clarify what's happening with his upcoming procedure with Dr. August Saucer, details, etc. The best call back number is [phone].

## 2020-08-26 ENCOUNTER — Other Ambulatory Visit: Payer: Self-pay

## 2020-09-20 ENCOUNTER — Other Ambulatory Visit (HOSPITAL_COMMUNITY): Payer: BC Managed Care – PPO

## 2020-09-23 ENCOUNTER — Telehealth: Payer: Self-pay

## 2020-09-23 NOTE — Telephone Encounter (Signed)
Pt called stating that his insurance hasn't received anything regarding his upcoming surgery please advise

## 2020-09-24 NOTE — Telephone Encounter (Signed)
Do you have anything on patient?

## 2020-09-24 NOTE — Telephone Encounter (Signed)
I called pt and advised that when I called his ins. Co in May I was advised that no prior Ernest Navarro is needed

## 2020-10-07 ENCOUNTER — Inpatient Hospital Stay: Payer: BC Managed Care – PPO | Admitting: Orthopedic Surgery

## 2020-10-22 NOTE — Pre-Procedure Instructions (Signed)
Surgical Instructions    Your procedure is scheduled on Thursday July 28th.   Report to [facility] Main Entrance "A" at 10:15 A.M., then check in with the Admitting office.  Call this number if you have problems the morning of surgery:  [phone]   If you have any questions prior to your surgery date call [phone]: Open Monday-Friday 8am-4pm    Remember:  Do not eat after midnight the night before your surgery  You may drink clear liquids until 09:15 A.M. the morning of your surgery.   Clear liquids allowed are: Water, Non-Citrus Juices (without pulp), Carbonated Beverages, Clear Tea, Black Coffee Only, and Gatorade  Patient Instructions  The night before surgery:  No food after midnight. ONLY clear liquids after midnight  The day of surgery (if you have diabetes): Drink ONE (1) 12 oz G2 given to you in your pre admission testing appointment by 09:15 A.M. the morning of surgery. Drink in one sitting. Do not sip.  This drink was given to you during your hospital  pre-op appointment visit.  Nothing else to drink after completing the  12 oz bottle of G2.         If you have questions, please contact your surgeon's office.     Take these medicines the morning of surgery with A SIP OF WATER   acetaminophen (TYLENOL)- If needed  methocarbamol (ROBAXIN)- If needed  traMADol (ULTRAM)- If needed  amLODipine (NORVASC)  atorvastatin (LIPITOR)  As of today, STOP taking any Aspirin (unless otherwise instructed by your surgeon) Meloxicam,Aleve, Naproxen, Ibuprofen, Motrin, Advil, Goody's, BC's, all herbal medications, fish oil, and all vitamins.          WHAT DO I DO ABOUT MY DIABETES MEDICATION?   Do not take oral diabetes medicines (pills) the morning of surgery.  DO NOT TAKE metFORMIN (GLUCOPHAGE) the morning of surgery.  DO NOT TAKE RYBELSUS the morning of surgery.  The day of surgery, do not take other diabetes injectables, including Byetta (exenatide), Bydureon  (exenatide ER), Victoza (liraglutide), or Trulicity (dulaglutide).   HOW TO MANAGE YOUR DIABETES BEFORE AND AFTER SURGERY  Why is it important to control my blood sugar before and after surgery? Improving blood sugar levels before and after surgery helps healing and can limit problems. A way of improving blood sugar control is eating a healthy diet by:  Eating less sugar and carbohydrates  Increasing activity/exercise  Talking with your doctor about reaching your blood sugar goals High blood sugars (greater than 180 mg/dL) can raise your risk of infections and slow your recovery, so you will need to focus on controlling your diabetes during the weeks before surgery. Make sure that the doctor who takes care of your diabetes knows about your planned surgery including the date and location.  How do I manage my blood sugar before surgery? Check your blood sugar at least 4 times a day, starting 2 days before surgery, to make sure that the level is not too high or low.  Check your blood sugar the morning of your surgery when you wake up and every 2 hours until you get to the Short Stay unit.  If your blood sugar is less than 70 mg/dL, you will need to treat for low blood sugar: Do not take insulin. Treat a low blood sugar (less than 70 mg/dL) with  cup of clear juice (cranberry or apple), 4 glucose tablets, OR glucose gel. Recheck blood sugar in 15 minutes after treatment (to make sure it is  greater than 70 mg/dL). If your blood sugar is not greater than 70 mg/dL on recheck, call [phone] for further instructions. Report your blood sugar to the short stay nurse when you get to Short Stay.  If you are admitted to the hospital after surgery: Your blood sugar will be checked by the staff and you will probably be given insulin after surgery (instead of oral diabetes medicines) to make sure you have good blood sugar levels. The goal for blood sugar control after surgery is 80-180 mg/dL.  Do  not wear jewelry or makeup Do not wear lotions, powders, perfumes/colognes, or deodorant. Do not shave 48 hours prior to surgery.  Men may shave face and neck. Do not bring valuables to the hospital. DO Not wear nail polish, gel polish, artificial nails, or any other type of covering on natural nails including finger and toenails. If patients have artificial nails, gel coating, etc. that need to be removed by a nail salon please have this removed prior to surgery or surgery may need to be canceled/delayed if the surgeon/ anesthesia feels like the patient is unable to be adequately monitored.             [facility] is not responsible for any belongings or valuables.  Do NOT Smoke (Tobacco/Vaping) or drink Alcohol 24 hours prior to your procedure If you use a CPAP at night, you may bring all equipment for your overnight stay.   Contacts, glasses, dentures or bridgework may not be worn into surgery, please bring cases for these belongings   For patients admitted to the hospital, discharge time will be determined by your treatment team.   Patients discharged the day of surgery will not be allowed to drive home, and someone needs to stay with them for 24 hours.  ONLY 1 SUPPORT PERSON MAY BE PRESENT WHILE YOU ARE IN SURGERY. IF YOU ARE TO BE ADMITTED ONCE YOU ARE IN YOUR ROOM YOU WILL BE ALLOWED TWO (2) VISITORS.  Minor children may have two parents present. Special consideration for safety and communication needs will be reviewed on a case by case basis.  Special instructions:    Oral Hygiene is also important to reduce your risk of infection.  Remember - BRUSH YOUR TEETH THE MORNING OF SURGERY WITH YOUR REGULAR TOOTHPASTE   [facility]- Preparing For Surgery  Before surgery, you can play an important role. Because skin is not sterile, your skin needs to be as free of germs as possible. You can reduce the number of germs on your skin by washing with CHG (chlorahexidine gluconate) Soap before  surgery.  CHG is an antiseptic cleaner which kills germs and bonds with the skin to continue killing germs even after washing.     Please do not use if you have an allergy to CHG or antibacterial soaps. If your skin becomes reddened/irritated stop using the CHG.  Do not shave (including legs and underarms) for at least 48 hours prior to first CHG shower. It is OK to shave your face.  Please follow these instructions carefully.     Shower the NIGHT BEFORE SURGERY and the MORNING OF SURGERY with CHG Soap.   If you chose to wash your hair, wash your hair first as usual with your normal shampoo. After you shampoo, rinse your hair and body thoroughly to remove the shampoo.  Then [employer] and genitals (private parts) with your normal soap and rinse thoroughly to remove soap.  After that Use CHG Soap as you  would any other liquid soap. You can apply CHG directly to the skin and wash gently with a scrungie or a clean washcloth.   Apply the CHG Soap to your body ONLY FROM THE NECK DOWN.  Do not use on open wounds or open sores. Avoid contact with your eyes, ears, mouth and genitals (private parts). Wash Face and genitals (private parts)  with your normal soap.   Wash thoroughly, paying special attention to the area where your surgery will be performed.  Thoroughly rinse your body with warm water from the neck down.  DO NOT shower/wash with your normal soap after using and rinsing off the CHG Soap.  Pat yourself dry with a CLEAN TOWEL.  Wear CLEAN PAJAMAS to bed the night before surgery  Place CLEAN SHEETS on your bed the night before your surgery  DO NOT SLEEP WITH PETS.   Day of Surgery:  Take a shower with CHG soap. Wear Clean/Comfortable clothing the morning of surgery Do not apply any deodorants/lotions.   Remember to brush your teeth WITH YOUR REGULAR TOOTHPASTE.   Please read over the following fact sheets that you were given.

## 2020-10-25 ENCOUNTER — Other Ambulatory Visit: Payer: Self-pay

## 2020-10-25 ENCOUNTER — Telehealth: Payer: Self-pay | Admitting: Orthopedic Surgery

## 2020-10-25 ENCOUNTER — Encounter (HOSPITAL_COMMUNITY): Payer: Self-pay

## 2020-10-25 ENCOUNTER — Encounter (HOSPITAL_COMMUNITY)
Admission: RE | Admit: 2020-10-25 | Discharge: 2020-10-25 | Disposition: A | Discharge status: Home / Self Care | Payer: BC Managed Care – PPO | Source: Ambulatory Visit | Attending: Orthopedic Surgery | Admitting: Orthopedic Surgery

## 2020-10-25 DIAGNOSIS — I10 Essential (primary) hypertension: Secondary | ICD-10-CM | POA: Diagnosis not present

## 2020-10-25 DIAGNOSIS — Z20822 Contact with and (suspected) exposure to covid-19: Secondary | ICD-10-CM | POA: Insufficient documentation

## 2020-10-25 DIAGNOSIS — Z7984 Long term (current) use of oral hypoglycemic drugs: Secondary | ICD-10-CM | POA: Diagnosis not present

## 2020-10-25 DIAGNOSIS — G4733 Obstructive sleep apnea (adult) (pediatric): Secondary | ICD-10-CM | POA: Insufficient documentation

## 2020-10-25 DIAGNOSIS — Z01818 Encounter for other preprocedural examination: Secondary | ICD-10-CM | POA: Insufficient documentation

## 2020-10-25 DIAGNOSIS — Z79899 Other long term (current) drug therapy: Secondary | ICD-10-CM | POA: Insufficient documentation

## 2020-10-25 DIAGNOSIS — M1711 Unilateral primary osteoarthritis, right knee: Secondary | ICD-10-CM | POA: Diagnosis not present

## 2020-10-25 DIAGNOSIS — E119 Type 2 diabetes mellitus without complications: Secondary | ICD-10-CM | POA: Insufficient documentation

## 2020-10-25 HISTORY — DX: Other specified postprocedural states: Z98.890

## 2020-10-25 HISTORY — DX: Headache, unspecified: R51.9

## 2020-10-25 HISTORY — DX: Nausea with vomiting, unspecified: R11.2

## 2020-10-25 HISTORY — DX: Pneumonia, unspecified organism: J18.9

## 2020-10-25 HISTORY — DX: Sleep apnea, unspecified: G47.30

## 2020-10-25 HISTORY — DX: Prediabetes: R73.03

## 2020-10-25 HISTORY — DX: Unspecified osteoarthritis, unspecified site: M19.90

## 2020-10-25 LAB — URINALYSIS, ROUTINE W REFLEX MICROSCOPIC
Bilirubin Urine: NEGATIVE
Glucose, UA: NEGATIVE mg/dL
Hgb urine dipstick: NEGATIVE
Ketones, ur: NEGATIVE mg/dL
Leukocytes,Ua: NEGATIVE
Nitrite: NEGATIVE
Protein, ur: NEGATIVE mg/dL
Specific Gravity, Urine: 1.02 (ref 1.005–1.030)
pH: 6 (ref 5.0–8.0)

## 2020-10-25 LAB — CBC
HCT: 49.7 % (ref 39.0–52.0)
Hemoglobin: 15.3 g/dL (ref 13.0–17.0)
MCH: 24.8 pg — ABNORMAL LOW (ref 26.0–34.0)
MCHC: 30.8 g/dL (ref 30.0–36.0)
MCV: 80.7 fL (ref 80.0–100.0)
Platelets: 249 10*3/uL (ref 150–400)
RBC: 6.16 MIL/uL — ABNORMAL HIGH (ref 4.22–5.81)
RDW: 15.6 % — ABNORMAL HIGH (ref 11.5–15.5)
WBC: 7.1 10*3/uL (ref 4.0–10.5)
nRBC: 0 % (ref 0.0–0.2)

## 2020-10-25 LAB — BASIC METABOLIC PANEL
Anion gap: 8 (ref 5–15)
BUN: 7 mg/dL (ref 6–20)
CO2: 24 mmol/L (ref 22–32)
Calcium: 9.4 mg/dL (ref 8.9–10.3)
Chloride: 106 mmol/L (ref 98–111)
Creatinine, Ser: 0.9 mg/dL (ref 0.61–1.24)
GFR, Estimated: >60 mL/min (ref >60)
Glucose, Bld: 117 mg/dL — ABNORMAL HIGH (ref 70–99)
Potassium: 3.4 mmol/L — ABNORMAL LOW (ref 3.5–5.1)
Sodium: 138 mmol/L (ref 135–145)

## 2020-10-25 LAB — HEMOGLOBIN A1C
Hgb A1c MFr Bld: 6.8 % — ABNORMAL HIGH (ref 4.8–5.6)
Mean Plasma Glucose: 148 mg/dL

## 2020-10-25 LAB — GLUCOSE, CAPILLARY: Glucose-Capillary: 120 mg/dL — ABNORMAL HIGH (ref 70–99)

## 2020-10-25 LAB — SARS CORONAVIRUS 2 (TAT 6-24 HRS): SARS Coronavirus 2: NEGATIVE

## 2020-10-25 LAB — SURGICAL PCR SCREEN
MRSA, PCR: NEGATIVE
Staphylococcus aureus: POSITIVE — AB

## 2020-10-25 NOTE — Telephone Encounter (Signed)
Patient is scheduled for right total knee this Thursday 10-28-20 and had PAT appointment this morning.  He would like to discuss his lab results prior to surgery (he noticed some explanation points)  and is asking if doctor will call him today.   Pt's cb [phone]

## 2020-10-25 NOTE — Progress Notes (Addendum)
PCP - [facility] Cardiologist - denies  PPM/ICD - denies   Chest x-ray - n/a EKG - 10/25/20 Stress Test - over 20 years ago, no symptoms of chest pain just needed clearance for job ECHO - denies Cardiac Cath - denies  Sleep Study - over 15 years ago CPAP - prescribed but never used it  Fasting Blood Sugar - unknown, does not check CBG at home   As of today, STOP taking any Aspirin (unless otherwise instructed by your surgeon) Meloxicam,Aleve, Naproxen, Ibuprofen, Motrin, Advil, Goody's, BC's, all herbal medications, fish oil, and all vitamins.  ERAS Protcol -yes PRE-SURGERY Ensure or G2-  G2 given  COVID TEST- 10/25/20 in PAT   Anesthesia review: yes, elevated BP at PAT appt. Pt left before we could recheck BP. Does not have cardiac history and does not see a cardiologist.  Patient denies shortness of breath, fever, cough and chest pain at PAT appointment   All instructions explained to the patient, with a verbal understanding of the material. Patient agrees to go over the instructions while at home for a better understanding. Patient also instructed to self quarantine after being tested for COVID-19. The opportunity to ask questions was provided.

## 2020-10-25 NOTE — Telephone Encounter (Signed)
Called to discuss.

## 2020-10-26 ENCOUNTER — Encounter (HOSPITAL_COMMUNITY): Payer: Self-pay

## 2020-10-26 LAB — URINE CULTURE: Culture: <10000 — AB

## 2020-10-26 NOTE — Progress Notes (Addendum)
Anesthesia Chart Review:  Case: [id number] Date/Time: 10/28/20 1200   Procedure: RIGHT TOTAL KNEE ARTHROPLASTY (Right: Knee)   Anesthesia type: General   Pre-op diagnosis: right knee osteoarthritis   Location: [facility] ROOM 02 / [facility]   Surgeons: Cammy Copa, MD       DISCUSSION: Patient is a 60 year old male scheduled for the above procedure.  History includes never smoker, postoperative N/V, HTN, DM2, OSA (is not using CPAP). BMI is consistent with morbid obesity.  BP elevated at 175/105 at PAT. [name] No recent comparison BP readings (most recent on from 01/06/20 147/88). I called Ernest Navarro. [name] He does not have a home BP monitor, but said he will got to a local drug store to see if he can get his BP checked. He will let me know the result by 10/27/20. [name], [name] (If he truly had not taken BP medication prior to PAT, this would have certainly contributed to the elevated reading.  He said he spoke to Dr. Diamantina Providence PA today.) At Dr. Wilmer Floor office, [name] I have sent a message to Dr. August Saucer regarding this. Mr. Quintin also says he has had significant amount of bilateral knee pain, [name]   A1c 6.8%.  [name]  Preoperative COVID-19 test negative on 10/25/2020.  Anesthesia team to evaluate on the day of surgery.  ADDENDUM 10/27/20 4:48 PM: Received records from Dr. Cindee Lame. BP 04/27/20 195/110 on 04/27/20, but down to 144/9/1 at 3/422 visit. Weight was also down 16 lbs to 297 lb. A1c 7.2% on 04/27/20, so some improvement now to 6.8%. [name] [name]    VS: BP (!) 175/105   Pulse 86   Temp 36.6 C (Oral)   Resp 18   Wt (!) 138.4 kg   SpO2 99%   BMI 41.38 kg/m  BP Readings from Last 3 Encounters:  10/25/20 (!) 175/105  01/06/20 (!) 147/88  07/26/15 123/90     PROVIDERS: Salome Spotted, MD is PCP. Records requested.    LABS: Labs reviewed: Acceptable for surgery. (all labs ordered are listed, but only abnormal results are displayed)  Labs Reviewed  SURGICAL PCR SCREEN - Abnormal; Notable for the following components:      Result Value   Staphylococcus aureus POSITIVE (*)    All other components within normal limits  URINE CULTURE - Abnormal; Notable for the following components:   Culture   (*)    Value: <10,000 COLONIES/mL INSIGNIFICANT GROWTH Performed at [facility], [address], [place], [place] [address]    All other components within normal limits  GLUCOSE, CAPILLARY - Abnormal; Notable for the following components:   Glucose-Capillary 120 (*)    All other components within normal limits  HEMOGLOBIN A1C - Abnormal; Notable for the following components:   Hgb A1c MFr Bld 6.8 (*)    All other components within normal limits  CBC - Abnormal; Notable for the following components:   RBC  6.16 (*)    MCH 24.8 (*)    RDW 15.6 (*)    All other components within normal limits  BASIC METABOLIC PANEL - Abnormal; Notable for the following components:   Potassium 3.4 (*)    Glucose, Bld 117 (*)    All other components within normal limits  SARS CORONAVIRUS 2 (TAT 6-24 HRS)  URINALYSIS, ROUTINE W REFLEX MICROSCOPIC    Home sleet study 05/31/15:"Unattended Home Sleep Test 05/31/2015-AHI 68.5/hour with desaturation to 63%. Body weight 210 pounds"  EKG: 10/25/20: NSR. EKG appears stable when compared to 04/27/20 tracing from PCP office.    CV: He reported a stress test greater than 20 years ago as part of job clearance.   Past Medical History:   Diagnosis Date   Arthritis    bilateral knees   Essential hypertension, benign 06/19/2012   Gout 06/19/2012   Headache    Hyperglycemia 06/19/2012   March 2014 A1c 6.5 (repeat needed)    Pneumonia    PONV (postoperative nausea and vomiting)    Pre-diabetes     Past Surgical History:  Procedure Laterality Date   KNEE SURGERY Right    x3 arthroscopies    MEDICATIONS:  acetaminophen (TYLENOL) 500 MG tablet   amLODipine (NORVASC) 10 MG tablet   atorvastatin (LIPITOR) 20 MG tablet   meloxicam (MOBIC) 15 MG tablet   metFORMIN (GLUCOPHAGE) 500 MG tablet   methocarbamol (ROBAXIN) 500 MG tablet   RYBELSUS 7 MG TABS   telmisartan (MICARDIS) 80 MG tablet   traMADol (ULTRAM) 50 MG tablet   No current facility-administered medications for this encounter.    Shonna Chock, PA-C Surgical Short Stay/Anesthesiology [facility] Phone [phone] [facility] Phone [phone] 10/26/2020 5:32 PM

## 2020-10-26 NOTE — Telephone Encounter (Signed)
tyvm

## 2020-10-27 MED ORDER — TRANEXAMIC ACID 1000 MG/10ML IV SOLN
2000.0000 mg | INTRAVENOUS | Status: DC
  Filled 2020-10-27: qty 20

## 2020-10-28 ENCOUNTER — Encounter (HOSPITAL_COMMUNITY): Payer: Self-pay | Admitting: Orthopedic Surgery

## 2020-10-28 ENCOUNTER — Other Ambulatory Visit: Payer: Self-pay

## 2020-10-28 ENCOUNTER — Encounter (HOSPITAL_COMMUNITY)
Admission: AD | Disposition: A | Discharge status: Skilled Nursing Facility | Payer: Self-pay | Source: Home / Self Care | Attending: Orthopedic Surgery

## 2020-10-28 ENCOUNTER — Inpatient Hospital Stay (HOSPITAL_COMMUNITY)
Admission: AD | Admit: 2020-10-28 | Discharge: 2020-11-03 | DRG: 470 | Disposition: A | Discharge status: Skilled Nursing Facility | Payer: BC Managed Care – PPO | Attending: Orthopedic Surgery | Admitting: Orthopedic Surgery

## 2020-10-28 ENCOUNTER — Ambulatory Visit (HOSPITAL_COMMUNITY): Payer: BC Managed Care – PPO | Admitting: Vascular Surgery

## 2020-10-28 ENCOUNTER — Ambulatory Visit (HOSPITAL_COMMUNITY): Payer: BC Managed Care – PPO | Admitting: Certified Registered Nurse Anesthetist

## 2020-10-28 DIAGNOSIS — R7303 Prediabetes: Secondary | ICD-10-CM | POA: Diagnosis present

## 2020-10-28 DIAGNOSIS — Z20822 Contact with and (suspected) exposure to covid-19: Secondary | ICD-10-CM | POA: Diagnosis present

## 2020-10-28 DIAGNOSIS — M109 Gout, unspecified: Secondary | ICD-10-CM | POA: Diagnosis present

## 2020-10-28 DIAGNOSIS — Z885 Allergy status to narcotic agent status: Secondary | ICD-10-CM

## 2020-10-28 DIAGNOSIS — J3089 Other allergic rhinitis: Secondary | ICD-10-CM | POA: Diagnosis present

## 2020-10-28 DIAGNOSIS — M1711 Unilateral primary osteoarthritis, right knee: Secondary | ICD-10-CM

## 2020-10-28 DIAGNOSIS — G4733 Obstructive sleep apnea (adult) (pediatric): Secondary | ICD-10-CM | POA: Diagnosis present

## 2020-10-28 DIAGNOSIS — Z96651 Presence of right artificial knee joint: Secondary | ICD-10-CM

## 2020-10-28 DIAGNOSIS — I10 Essential (primary) hypertension: Secondary | ICD-10-CM | POA: Diagnosis present

## 2020-10-28 DIAGNOSIS — Z6841 Body Mass Index (BMI) 40.0 and over, adult: Secondary | ICD-10-CM

## 2020-10-28 HISTORY — PX: TOTAL KNEE ARTHROPLASTY: SHX125

## 2020-10-28 LAB — GLUCOSE, CAPILLARY
Glucose-Capillary: 126 mg/dL — ABNORMAL HIGH (ref 70–99)
Glucose-Capillary: 119 mg/dL — ABNORMAL HIGH (ref 70–99)
Glucose-Capillary: 120 mg/dL — ABNORMAL HIGH (ref 70–99)

## 2020-10-28 SURGERY — ARTHROPLASTY, KNEE, TOTAL
Anesthesia: Regional | Site: Knee | Laterality: Right

## 2020-10-28 MED ORDER — MIDAZOLAM HCL 2 MG/2ML IJ SOLN
INTRAMUSCULAR | Status: AC
  Administered 2020-10-28: 1 mg via INTRAVENOUS
  Filled 2020-10-28: qty 2

## 2020-10-28 MED ORDER — CEFAZOLIN SODIUM-DEXTROSE 2-4 GM/100ML-% IV SOLN
2.0000 g | Freq: Three times a day (TID) | INTRAVENOUS | Status: AC
  Administered 2020-10-28 – 2020-10-29 (×2): 2 g via INTRAVENOUS
  Filled 2020-10-28 (×2): qty 100

## 2020-10-28 MED ORDER — LACTATED RINGERS IV SOLN
INTRAVENOUS | Status: DC
Start: 2020-10-28 — End: 2020-10-28

## 2020-10-28 MED ORDER — MORPHINE SULFATE (PF) 4 MG/ML IV SOLN
INTRAVENOUS | Status: DC | PRN
  Administered 2020-10-28: 8 mg

## 2020-10-28 MED ORDER — HYDROMORPHONE HCL 2 MG PO TABS
1.0000 mg | ORAL_TABLET | ORAL | Status: DC | PRN
  Administered 2020-10-29 (×2): 2 mg via ORAL
  Filled 2020-10-28 (×4): qty 1

## 2020-10-28 MED ORDER — FENTANYL CITRATE (PF) 100 MCG/2ML IJ SOLN
INTRAMUSCULAR | Status: AC
  Filled 2020-10-28: qty 2

## 2020-10-28 MED ORDER — PHENYLEPHRINE 40 MCG/ML (10ML) SYRINGE FOR IV PUSH (FOR BLOOD PRESSURE SUPPORT)
PREFILLED_SYRINGE | INTRAVENOUS | Status: AC
  Filled 2020-10-28: qty 20

## 2020-10-28 MED ORDER — CLONIDINE HCL (ANALGESIA) 100 MCG/ML EP SOLN
EPIDURAL | Status: AC
  Filled 2020-10-28: qty 10

## 2020-10-28 MED ORDER — PHENYLEPHRINE 40 MCG/ML (10ML) SYRINGE FOR IV PUSH (FOR BLOOD PRESSURE SUPPORT)
PREFILLED_SYRINGE | INTRAVENOUS | Status: AC
  Filled 2020-10-28: qty 10

## 2020-10-28 MED ORDER — IRRISEPT - 450ML BOTTLE WITH 0.05% CHG IN STERILE WATER, USP 99.95% OPTIME
TOPICAL | Status: DC | PRN
  Administered 2020-10-28 (×2): 450 mL

## 2020-10-28 MED ORDER — SUGAMMADEX SODIUM 200 MG/2ML IV SOLN
INTRAVENOUS | Status: DC | PRN
  Administered 2020-10-28: 400 mg via INTRAVENOUS

## 2020-10-28 MED ORDER — ONDANSETRON HCL 4 MG/2ML IJ SOLN
INTRAMUSCULAR | Status: AC
  Filled 2020-10-28: qty 2

## 2020-10-28 MED ORDER — LACTATED RINGERS IV SOLN: INTRAVENOUS | Status: DC

## 2020-10-28 MED ORDER — MORPHINE SULFATE (PF) 4 MG/ML IV SOLN
INTRAVENOUS | Status: AC
  Filled 2020-10-28: qty 2

## 2020-10-28 MED ORDER — POVIDONE-IODINE 10 % EX SWAB: 2.0000 "application " | Freq: Once | CUTANEOUS | Status: DC

## 2020-10-28 MED ORDER — BUPIVACAINE HCL (PF) 0.5 % IJ SOLN
INTRAMUSCULAR | Status: DC | PRN
  Administered 2020-10-28: 20 mL via PERINEURAL

## 2020-10-28 MED ORDER — METHOCARBAMOL 1000 MG/10ML IJ SOLN
500.0000 mg | Freq: Four times a day (QID) | INTRAVENOUS | Status: DC | PRN
  Administered 2020-10-29: 500 mg via INTRAVENOUS
  Filled 2020-10-28: qty 5
  Filled 2020-10-28: qty 500
  Filled 2020-10-28 (×2): qty 5

## 2020-10-28 MED ORDER — MIDAZOLAM HCL 2 MG/2ML IJ SOLN
INTRAMUSCULAR | Status: AC
  Filled 2020-10-28: qty 2

## 2020-10-28 MED ORDER — BUPIVACAINE HCL 0.25 % IJ SOLN
INTRAMUSCULAR | Status: DC | PRN
  Administered 2020-10-28: 30 mL

## 2020-10-28 MED ORDER — LACTATED RINGERS IV SOLN: INTRAVENOUS | Status: DC | PRN

## 2020-10-28 MED ORDER — ACETAMINOPHEN 325 MG PO TABS: 325.0000 mg | ORAL_TABLET | Freq: Four times a day (QID) | ORAL | Status: DC | PRN

## 2020-10-28 MED ORDER — PHENYLEPHRINE HCL (PRESSORS) 10 MG/ML IV SOLN
INTRAVENOUS | Status: DC | PRN
  Administered 2020-10-28: 80 ug via INTRAVENOUS
  Administered 2020-10-28: 120 ug via INTRAVENOUS
  Administered 2020-10-28 (×3): 80 ug via INTRAVENOUS
  Administered 2020-10-28: 120 ug via INTRAVENOUS
  Administered 2020-10-28 (×3): 80 ug via INTRAVENOUS
  Administered 2020-10-28: 160 ug via INTRAVENOUS

## 2020-10-28 MED ORDER — ONDANSETRON HCL 4 MG PO TABS: 4.0000 mg | ORAL_TABLET | Freq: Four times a day (QID) | ORAL | Status: DC | PRN

## 2020-10-28 MED ORDER — MIDAZOLAM HCL 2 MG/2ML IJ SOLN
INTRAMUSCULAR | Status: DC | PRN
  Administered 2020-10-28: 2 mg via INTRAVENOUS

## 2020-10-28 MED ORDER — PROPOFOL 10 MG/ML IV BOLUS
INTRAVENOUS | Status: DC | PRN
  Administered 2020-10-28: 150 mg via INTRAVENOUS

## 2020-10-28 MED ORDER — DROPERIDOL 2.5 MG/ML IJ SOLN: 0.6250 mg | Freq: Once | INTRAMUSCULAR | Status: DC | PRN

## 2020-10-28 MED ORDER — SUFENTANIL CITRATE 50 MCG/ML IV SOLN
INTRAVENOUS | Status: DC | PRN
  Administered 2020-10-28 (×2): 10 ug via INTRAVENOUS

## 2020-10-28 MED ORDER — LIDOCAINE 2% (20 MG/ML) 5 ML SYRINGE
INTRAMUSCULAR | Status: AC
  Filled 2020-10-28: qty 5

## 2020-10-28 MED ORDER — ORAL CARE MOUTH RINSE: 15.0000 mL | Freq: Once | OROMUCOSAL | Status: AC

## 2020-10-28 MED ORDER — DOCUSATE SODIUM 100 MG PO CAPS
100.0000 mg | ORAL_CAPSULE | Freq: Two times a day (BID) | ORAL | Status: DC
  Administered 2020-10-28: 100 mg via ORAL
  Filled 2020-10-28 (×2): qty 1

## 2020-10-28 MED ORDER — HYDROMORPHONE HCL 1 MG/ML IJ SOLN
INTRAMUSCULAR | Status: AC
  Filled 2020-10-28: qty 1

## 2020-10-28 MED ORDER — SEMAGLUTIDE 7 MG PO TABS: 7.0000 mg | ORAL_TABLET | Freq: Every morning | ORAL | Status: DC

## 2020-10-28 MED ORDER — SODIUM CHLORIDE 0.9% FLUSH
INTRAVENOUS | Status: DC | PRN
  Administered 2020-10-28: 20 mL

## 2020-10-28 MED ORDER — ASPIRIN 81 MG PO CHEW
81.0000 mg | CHEWABLE_TABLET | Freq: Two times a day (BID) | ORAL | Status: DC
  Administered 2020-10-28 – 2020-11-03 (×12): 81 mg via ORAL
  Filled 2020-10-28 (×13): qty 1

## 2020-10-28 MED ORDER — PHENOL 1.4 % MT LIQD: 1.0000 | OROMUCOSAL | Status: DC | PRN

## 2020-10-28 MED ORDER — ONDANSETRON HCL 4 MG/2ML IJ SOLN
4.0000 mg | Freq: Four times a day (QID) | INTRAMUSCULAR | Status: DC | PRN
  Administered 2020-10-28 – 2020-10-29 (×2): 4 mg via INTRAVENOUS
  Filled 2020-10-28 (×2): qty 2

## 2020-10-28 MED ORDER — ROCURONIUM BROMIDE 10 MG/ML (PF) SYRINGE
PREFILLED_SYRINGE | INTRAVENOUS | Status: AC
  Filled 2020-10-28: qty 10

## 2020-10-28 MED ORDER — TRANEXAMIC ACID-NACL 1000-0.7 MG/100ML-% IV SOLN
INTRAVENOUS | Status: AC
  Filled 2020-10-28: qty 100

## 2020-10-28 MED ORDER — ACETAMINOPHEN 500 MG PO TABS
1000.0000 mg | ORAL_TABLET | Freq: Four times a day (QID) | ORAL | Status: AC
  Administered 2020-10-28 – 2020-10-29 (×4): 1000 mg via ORAL
  Filled 2020-10-28 (×4): qty 2

## 2020-10-28 MED ORDER — ROCURONIUM 10MG/ML (10ML) SYRINGE FOR MEDFUSION PUMP - OPTIME
INTRAVENOUS | Status: DC | PRN
  Administered 2020-10-28: 30 mg via INTRAVENOUS
  Administered 2020-10-28: 100 mg via INTRAVENOUS

## 2020-10-28 MED ORDER — SUFENTANIL CITRATE 50 MCG/ML IV SOLN
INTRAVENOUS | Status: AC
  Filled 2020-10-28: qty 1

## 2020-10-28 MED ORDER — IRBESARTAN 300 MG PO TABS
300.0000 mg | ORAL_TABLET | Freq: Every day | ORAL | Status: DC
  Administered 2020-10-29 – 2020-11-03 (×6): 300 mg via ORAL
  Filled 2020-10-28 (×6): qty 1

## 2020-10-28 MED ORDER — GLYCOPYRROLATE PF 0.2 MG/ML IJ SOSY
PREFILLED_SYRINGE | INTRAMUSCULAR | Status: AC
  Filled 2020-10-28: qty 1

## 2020-10-28 MED ORDER — CEFAZOLIN IN SODIUM CHLORIDE 3-0.9 GM/100ML-% IV SOLN
INTRAVENOUS | Status: AC
  Filled 2020-10-28: qty 100

## 2020-10-28 MED ORDER — FENTANYL CITRATE (PF) 100 MCG/2ML IJ SOLN
INTRAMUSCULAR | Status: AC
  Administered 2020-10-28: 50 ug via INTRAVENOUS
  Filled 2020-10-28: qty 2

## 2020-10-28 MED ORDER — MIDAZOLAM HCL 2 MG/2ML IJ SOLN: 1.0000 mg | Freq: Once | INTRAMUSCULAR | Status: AC

## 2020-10-28 MED ORDER — FENTANYL CITRATE (PF) 100 MCG/2ML IJ SOLN: 50.0000 ug | Freq: Once | INTRAMUSCULAR | Status: AC

## 2020-10-28 MED ORDER — CELECOXIB 200 MG PO CAPS
200.0000 mg | ORAL_CAPSULE | Freq: Two times a day (BID) | ORAL | Status: DC
  Administered 2020-10-29 – 2020-11-03 (×11): 200 mg via ORAL
  Filled 2020-10-28 (×12): qty 1

## 2020-10-28 MED ORDER — POVIDONE-IODINE 7.5 % EX SOLN: Freq: Once | CUTANEOUS | Status: DC

## 2020-10-28 MED ORDER — FENTANYL CITRATE (PF) 100 MCG/2ML IJ SOLN
25.0000 ug | INTRAMUSCULAR | Status: DC | PRN
  Administered 2020-10-28: 50 ug via INTRAVENOUS
  Administered 2020-10-28: 25 ug via INTRAVENOUS
  Administered 2020-10-28: 50 ug via INTRAVENOUS
  Administered 2020-10-28: 25 ug via INTRAVENOUS

## 2020-10-28 MED ORDER — DEXMEDETOMIDINE (PRECEDEX) IN NS 20 MCG/5ML (4 MCG/ML) IV SYRINGE
PREFILLED_SYRINGE | INTRAVENOUS | Status: DC | PRN
  Administered 2020-10-28 (×2): 4 ug via INTRAVENOUS
  Administered 2020-10-28 (×2): 8 ug via INTRAVENOUS

## 2020-10-28 MED ORDER — SODIUM CHLORIDE (PF) 0.9 % IJ SOLN
INTRAMUSCULAR | Status: AC
  Filled 2020-10-28: qty 10

## 2020-10-28 MED ORDER — CEFAZOLIN SODIUM-DEXTROSE 2-4 GM/100ML-% IV SOLN
INTRAVENOUS | Status: AC
  Filled 2020-10-28: qty 100

## 2020-10-28 MED ORDER — HYDROMORPHONE HCL 1 MG/ML IJ SOLN
0.5000 mg | INTRAMUSCULAR | Status: DC | PRN
  Administered 2020-10-28 – 2020-10-29 (×5): 1 mg via INTRAVENOUS
  Filled 2020-10-28 (×5): qty 1

## 2020-10-28 MED ORDER — LIDOCAINE HCL (CARDIAC) PF 100 MG/5ML IV SOSY
PREFILLED_SYRINGE | INTRAVENOUS | Status: DC | PRN
  Administered 2020-10-28: 100 mg via INTRAVENOUS

## 2020-10-28 MED ORDER — PROMETHAZINE HCL 25 MG/ML IJ SOLN: 6.2500 mg | INTRAMUSCULAR | Status: DC | PRN

## 2020-10-28 MED ORDER — HYDROMORPHONE HCL 1 MG/ML IJ SOLN
0.5000 mg | INTRAMUSCULAR | Status: DC | PRN
  Administered 2020-10-28 (×2): 0.5 mg via INTRAVENOUS
  Filled 2020-10-28: qty 0.5

## 2020-10-28 MED ORDER — VANCOMYCIN HCL 1000 MG IV SOLR
INTRAVENOUS | Status: AC
  Filled 2020-10-28: qty 1000

## 2020-10-28 MED ORDER — ACETAMINOPHEN 500 MG PO TABS
1000.0000 mg | ORAL_TABLET | Freq: Once | ORAL | Status: AC
  Administered 2020-10-28: 1000 mg via ORAL
  Filled 2020-10-28: qty 2

## 2020-10-28 MED ORDER — ONDANSETRON HCL 4 MG/2ML IJ SOLN
INTRAMUSCULAR | Status: DC | PRN
  Administered 2020-10-28: 4 mg via INTRAVENOUS

## 2020-10-28 MED ORDER — CEFAZOLIN IN SODIUM CHLORIDE 3-0.9 GM/100ML-% IV SOLN
3.0000 g | INTRAVENOUS | Status: AC
  Administered 2020-10-28: 3 g via INTRAVENOUS

## 2020-10-28 MED ORDER — DEXAMETHASONE SODIUM PHOSPHATE 10 MG/ML IJ SOLN
INTRAMUSCULAR | Status: AC
  Filled 2020-10-28: qty 1

## 2020-10-28 MED ORDER — METOCLOPRAMIDE HCL 5 MG PO TABS
5.0000 mg | ORAL_TABLET | Freq: Three times a day (TID) | ORAL | Status: DC | PRN
Start: 2020-10-28 — End: 2020-11-03

## 2020-10-28 MED ORDER — AMLODIPINE BESYLATE 10 MG PO TABS
10.0000 mg | ORAL_TABLET | Freq: Every day | ORAL | Status: DC
  Administered 2020-10-29 – 2020-11-03 (×6): 10 mg via ORAL
  Filled 2020-10-28 (×6): qty 1

## 2020-10-28 MED ORDER — VANCOMYCIN HCL 1000 MG IV SOLR
INTRAVENOUS | Status: DC | PRN
  Administered 2020-10-28: 1000 mg via TOPICAL

## 2020-10-28 MED ORDER — METOCLOPRAMIDE HCL 5 MG/ML IJ SOLN
5.0000 mg | Freq: Three times a day (TID) | INTRAMUSCULAR | Status: DC | PRN
  Administered 2020-10-28: 10 mg via INTRAVENOUS
  Filled 2020-10-28: qty 2

## 2020-10-28 MED ORDER — TRANEXAMIC ACID 1000 MG/10ML IV SOLN
INTRAVENOUS | Status: DC | PRN
  Administered 2020-10-28: 2000 mg via TOPICAL

## 2020-10-28 MED ORDER — SODIUM CHLORIDE 0.9 % IR SOLN
Status: DC | PRN
  Administered 2020-10-28: 3000 mL

## 2020-10-28 MED ORDER — DEXMEDETOMIDINE (PRECEDEX) IN NS 20 MCG/5ML (4 MCG/ML) IV SYRINGE
PREFILLED_SYRINGE | INTRAVENOUS | Status: AC
  Filled 2020-10-28: qty 5

## 2020-10-28 MED ORDER — CLONIDINE HCL (ANALGESIA) 100 MCG/ML EP SOLN
EPIDURAL | Status: DC | PRN
  Administered 2020-10-28: 150 ug

## 2020-10-28 MED ORDER — BUPIVACAINE LIPOSOME 1.3 % IJ SUSP
INTRAMUSCULAR | Status: AC
  Filled 2020-10-28: qty 20

## 2020-10-28 MED ORDER — CHLORHEXIDINE GLUCONATE 0.12 % MT SOLN: 15.0000 mL | Freq: Once | OROMUCOSAL | Status: AC

## 2020-10-28 MED ORDER — MENTHOL 3 MG MT LOZG: 1.0000 | LOZENGE | OROMUCOSAL | Status: DC | PRN

## 2020-10-28 MED ORDER — POVIDONE-IODINE 10 % EX SWAB
2.0000 "application " | Freq: Once | CUTANEOUS | Status: AC
  Administered 2020-10-28: 2 via TOPICAL

## 2020-10-28 MED ORDER — METHOCARBAMOL 500 MG PO TABS
500.0000 mg | ORAL_TABLET | Freq: Four times a day (QID) | ORAL | Status: DC | PRN
  Administered 2020-10-28 – 2020-11-01 (×5): 500 mg via ORAL
  Filled 2020-10-28 (×8): qty 1

## 2020-10-28 MED ORDER — CHLORHEXIDINE GLUCONATE 0.12 % MT SOLN
OROMUCOSAL | Status: AC
  Administered 2020-10-28: 15 mL via OROMUCOSAL
  Filled 2020-10-28: qty 15

## 2020-10-28 MED ORDER — BUPIVACAINE LIPOSOME 1.3 % IJ SUSP
INTRAMUSCULAR | Status: DC | PRN
  Administered 2020-10-28: 20 mL

## 2020-10-28 MED ORDER — 0.9 % SODIUM CHLORIDE (POUR BTL) OPTIME
TOPICAL | Status: DC | PRN
  Administered 2020-10-28: 1000 mL
  Administered 2020-10-28: 3000 mL

## 2020-10-28 MED ORDER — GLYCOPYRROLATE 0.2 MG/ML IJ SOLN
INTRAMUSCULAR | Status: DC | PRN
  Administered 2020-10-28: .2 mg via INTRAVENOUS

## 2020-10-28 MED ORDER — TRANEXAMIC ACID-NACL 1000-0.7 MG/100ML-% IV SOLN
1000.0000 mg | INTRAVENOUS | Status: AC
  Administered 2020-10-28: 1000 mg via INTRAVENOUS

## 2020-10-28 SURGICAL SUPPLY — 78 items
BAG COUNTER SPONGE SURGICOUNT (BAG) ×4 IMPLANT
BAG DECANTER FOR FLEXI CONT (MISCELLANEOUS) ×2 IMPLANT
BANDAGE ESMARK 6X9 LF (GAUZE/BANDAGES/DRESSINGS) ×1 IMPLANT
BLADE SAW SGTL 11.0X1.19X90.0M (BLADE) ×2 IMPLANT
BLADE SURG 10 STRL SS (BLADE) ×4 IMPLANT
BLADE SURG 15 STRL LF DISP TIS (BLADE) ×2 IMPLANT
BLADE SURG 15 STRL SS (BLADE) ×2
BNDG COHESIVE 6X5 TAN STRL LF (GAUZE/BANDAGES/DRESSINGS) ×2 IMPLANT
BNDG ELASTIC 6X15 VLCR STRL LF (GAUZE/BANDAGES/DRESSINGS) ×2 IMPLANT
BNDG ESMARK 6X9 LF (GAUZE/BANDAGES/DRESSINGS) ×2
BOWL SMART MIX CTS (DISPOSABLE) IMPLANT
CNTNR URN SCR LID CUP LEK RST (MISCELLANEOUS) ×1 IMPLANT
COMPONENT TRI CR FEM SZ5 KNEE (Orthopedic Implant) ×1 IMPLANT
CONT SPEC 4OZ STRL OR WHT (MISCELLANEOUS) ×1
COVER SURGICAL LIGHT HANDLE (MISCELLANEOUS) ×4 IMPLANT
CUFF TOURN SGL QUICK 34 (TOURNIQUET CUFF)
CUFF TOURN SGL QUICK 42 (TOURNIQUET CUFF) ×2 IMPLANT
CUFF TRNQT CYL 34X4.125X (TOURNIQUET CUFF) IMPLANT
DRAPE INCISE IOBAN 66X45 STRL (DRAPES) ×2 IMPLANT
DRAPE ORTHO SPLIT 77X108 STRL (DRAPES) ×3
DRAPE SURG ORHT 6 SPLT 77X108 (DRAPES) ×3 IMPLANT
DRAPE U-SHAPE 47X51 STRL (DRAPES) ×2 IMPLANT
DRSG AQUACEL AG ADV 3.5X14 (GAUZE/BANDAGES/DRESSINGS) ×2 IMPLANT
DURAPREP 26ML APPLICATOR (WOUND CARE) ×6 IMPLANT
ELECT CAUTERY BLADE 6.4 (BLADE) ×4 IMPLANT
ELECT REM PT RETURN 9FT ADLT (ELECTROSURGICAL) ×2
ELECTRODE REM PT RTRN 9FT ADLT (ELECTROSURGICAL) ×1 IMPLANT
GAUZE SPONGE 4X4 12PLY STRL (GAUZE/BANDAGES/DRESSINGS) ×2 IMPLANT
GLOVE SRG 8 PF TXTR STRL LF DI (GLOVE) ×1 IMPLANT
GLOVE SURG LTX SZ7 (GLOVE) ×2 IMPLANT
GLOVE SURG LTX SZ8 (GLOVE) ×2 IMPLANT
GLOVE SURG UNDER POLY LF SZ7 (GLOVE) ×2 IMPLANT
GLOVE SURG UNDER POLY LF SZ8 (GLOVE) ×1
GOWN STRL REUS W/ TWL LRG LVL3 (GOWN DISPOSABLE) ×3 IMPLANT
GOWN STRL REUS W/TWL LRG LVL3 (GOWN DISPOSABLE) ×3
HANDPIECE INTERPULSE COAX TIP (DISPOSABLE) ×1
HOOD PEEL AWAY FLYTE STAYCOOL (MISCELLANEOUS) ×8 IMPLANT
IMMOBILIZER KNEE 20 (SOFTGOODS)
IMMOBILIZER KNEE 20 THIGH 36 (SOFTGOODS) IMPLANT
IMMOBILIZER KNEE 22 UNIV (SOFTGOODS) ×2 IMPLANT
IMMOBILIZER KNEE 24 THIGH 36 (MISCELLANEOUS) IMPLANT
IMMOBILIZER KNEE 24 UNIV (MISCELLANEOUS)
INSERT TRIATH CS X3 11 (Insert) ×2 IMPLANT
KIT BASIN OR (CUSTOM PROCEDURE TRAY) ×2 IMPLANT
KIT TURNOVER KIT B (KITS) ×2 IMPLANT
KNEE TIBIAL COMPONENT SZ5 (Knees) ×2 IMPLANT
MANIFOLD NEPTUNE II (INSTRUMENTS) ×2 IMPLANT
NEEDLE 22X1 1/2 (OR ONLY) (NEEDLE) ×2 IMPLANT
NEEDLE SPNL 18GX3.5 QUINCKE PK (NEEDLE) ×2 IMPLANT
NS IRRIG 1000ML POUR BTL (IV SOLUTION) ×4 IMPLANT
PACK TOTAL JOINT (CUSTOM PROCEDURE TRAY) ×2 IMPLANT
PAD ARMBOARD 7.5X6 YLW CONV (MISCELLANEOUS) ×4 IMPLANT
PAD CAST 4YDX4 CTTN HI CHSV (CAST SUPPLIES) ×1 IMPLANT
PAD COLD SHLDR WRAP-ON (PAD) ×2 IMPLANT
PADDING CAST COTTON 4X4 STRL (CAST SUPPLIES) ×1
PADDING CAST COTTON 6X4 STRL (CAST SUPPLIES) ×2 IMPLANT
PATELLA ASYMMETRIC 38X11 KNEE (Orthopedic Implant) ×2 IMPLANT
PIN FLUTED HEDLESS FIX 3.5X1/8 (PIN) ×2 IMPLANT
SET HNDPC FAN SPRY TIP SCT (DISPOSABLE) ×1 IMPLANT
SPONGE T-LAP 18X18 ~~LOC~~+RFID (SPONGE) ×10 IMPLANT
STRIP CLOSURE SKIN 1/2X4 (GAUZE/BANDAGES/DRESSINGS) ×2 IMPLANT
SUCTION FRAZIER HANDLE 10FR (MISCELLANEOUS) ×1
SUCTION TUBE FRAZIER 10FR DISP (MISCELLANEOUS) ×1 IMPLANT
SUT MNCRL AB 3-0 PS2 18 (SUTURE) ×2 IMPLANT
SUT VIC AB 0 CT1 27 (SUTURE) ×5
SUT VIC AB 0 CT1 27XBRD ANBCTR (SUTURE) ×5 IMPLANT
SUT VIC AB 1 CT1 27 (SUTURE) ×7
SUT VIC AB 1 CT1 27XBRD ANBCTR (SUTURE) ×7 IMPLANT
SUT VIC AB 2-0 CT1 27 (SUTURE) ×5
SUT VIC AB 2-0 CT1 TAPERPNT 27 (SUTURE) ×5 IMPLANT
SYR 30ML LL (SYRINGE) ×14 IMPLANT
SYR TB 1ML LUER SLIP (SYRINGE) ×2 IMPLANT
TOWEL GREEN STERILE (TOWEL DISPOSABLE) ×4 IMPLANT
TOWEL GREEN STERILE FF (TOWEL DISPOSABLE) ×4 IMPLANT
TRAY CATH INTERMITTENT SS 16FR (CATHETERS) ×2 IMPLANT
TRI CRUC RET FEM SZ5 KNEE (Orthopedic Implant) ×2 IMPLANT
WATER STERILE IRR 1000ML POUR (IV SOLUTION) ×2 IMPLANT
YANKAUER SUCT BULB TIP NO VENT (SUCTIONS) ×2 IMPLANT

## 2020-10-28 NOTE — Progress Notes (Signed)
PT Cancellation Note  Patient Details Name: Ernest Navarro. MRN: [id number] DOB: 01-18-1961   Cancelled Treatment:    Reason Eval/Treat Not Completed: Other (comment) Pt with later surgery, not in room.  Will f/u tomorrow.  Anise Salvo, PT Acute Rehab Services Pager [phone] [facility] [phone]   Rayetta Humphrey 10/28/2020, 5:00 PM

## 2020-10-28 NOTE — Anesthesia Procedure Notes (Signed)
Anesthesia Regional Block: Adductor canal block   Pre-Anesthetic Checklist: , timeout performed,  Correct Patient, Correct Site, Correct Laterality,  Correct Procedure, Correct Position, site marked,  Risks and benefits discussed,  Surgical consent,  Pre-op evaluation,  At surgeon's request and post-op pain management  Laterality: Right  Prep: chloraprep       Needles:  Injection technique: Single-shot  Needle Type: Echogenic Stimulator Needle     Needle Length: 10cm  Needle Gauge: 20     Additional Needles:   Narrative:  Start time: 10/28/2020 11:00 AM End time: 10/28/2020 11:05 AM Injection made incrementally with aspirations every 5 mL.  Performed by: Personally  Anesthesiologist: Mellody Dance, MD  Additional Notes: A functioning IV was confirmed and monitors were applied.  Sterile prep and drape, hand hygiene and sterile gloves were used.  Negative aspiration and test dose prior to incremental administration of local anesthetic. The patient tolerated the procedure well.Ultrasound  guidance: relevant anatomy identified, needle position confirmed, local anesthetic spread visualized around nerve(s), vascular puncture avoided.  Image printed for medical record.

## 2020-10-28 NOTE — Progress Notes (Signed)
Orthopedic Tech Progress Note Patient Details:  Ernest Navarro [birth year] [id number]  CPM Right Knee Right Knee Flexion (Degrees): 10 Right Knee Extension (Degrees): 40  Post Interventions Patient Tolerated: Well Ortho Devices Type of Ortho Device: Bone foam zero knee, CPM padding Ortho Device/Splint Location: RLE Ortho Device/Splint Interventions: Application, Ordered   Post Interventions Patient Tolerated: Well  [name] 10/28/2020, 6:31 PM

## 2020-10-28 NOTE — H&P (Addendum)
TOTAL KNEE ADMISSION H&P  Patient is being admitted for right total knee arthroplasty.  Subjective:  Chief Complaint:right knee pain.  HPI: Ernest Navarro., 60 y.o. male, has a history of pain and functional disability in the right knee due to arthritis and has failed non-surgical conservative treatments for greater than 12 weeks to includeNSAID's and/or analgesics, corticosteriod injections, viscosupplementation injections, flexibility and strengthening excercises, use of assistive devices, and activity modification.  Onset of symptoms was gradual, starting 7 years ago with gradually worsening course since that time. The patient noted 3 prior arthroscopy on the right knee(s).  Patient currently rates pain in the right knee(s) at 8 out of 10 with activity. Patient has night pain, worsening of pain with activity and weight bearing, pain that interferes with activities of daily living, pain with passive range of motion, crepitus, and joint swelling.  Patient has evidence of subchondral cysts and joint space narrowing by imaging studies. This patient has had  long history.  Of knee pain affecting all activities of daily living. . There is no active infection.  Patient Active Problem List   Diagnosis Date Noted   Obesity 06/29/2015   Obstructive sleep apnea 05/06/2015   Seasonal and perennial allergic rhinitis 05/06/2015   Hyperglycemia 06/19/2012   Essential hypertension, benign 06/19/2012   Gout 06/19/2012   Nocturia 06/19/2012   Past Medical History:  Diagnosis Date   Arthritis    bilateral knees   Essential hypertension, benign 06/19/2012   Gout 06/19/2012   Headache    Hyperglycemia 06/19/2012   March 2014 A1c 6.5 (repeat needed)    Pneumonia    PONV (postoperative nausea and vomiting)    Pre-diabetes    Sleep apnea     Past Surgical History:  Procedure Laterality Date   KNEE SURGERY Right    x3 arthroscopies    Current Facility-Administered Medications  Medication Dose  Route Frequency Provider Last Rate Last Admin   ceFAZolin (ANCEF) IVPB 3g/100 mL premix  3 g Intravenous On Call to OR Cammy Copa, MD       fentaNYL (SUBLIMAZE) 100 MCG/2ML injection            lactated ringers infusion   Intravenous Continuous Stoltzfus, Nelle Don, DO 10 mL/hr at 10/28/20 1117 New Bag at 10/28/20 1117   midazolam (VERSED) 2 MG/2ML injection            povidone-iodine (BETADINE) 7.5 % scrub   Topical Once Magnant, Charles L, PA-C       povidone-iodine 10 % swab 2 application  2 application Topical Once Magnant, Charles L, PA-C       tranexamic acid (CYKLOKAPRON) 2,000 mg in sodium chloride 0.9 % 50 mL Topical Application  2,000 mg Topical To OR August Saucer, Corrie Mckusick, MD       tranexamic acid (CYKLOKAPRON) IVPB 1,000 mg  1,000 mg Intravenous To OR Magnant, Charles L, PA-C       Facility-Administered Medications Ordered in Other Encounters  Medication Dose Route Frequency Provider Last Rate Last Admin   bupivacaine (MARCAINE) 0.5 % injection   Peri-NEURAL Anesthesia Intra-op Mellody Dance, MD   20 mL at 10/28/20 1104   Allergies  Allergen Reactions   Oxycodone Nausea Only    Makes him "delirious"    Social History   Tobacco Use   Smoking status: Never   Smokeless tobacco: Never  Substance Use Topics   Alcohol use: No    Alcohol/week: 0.0 standard drinks    Family  History  Problem Relation Age of Onset   Colon cancer Neg Hx      Review of Systems  Musculoskeletal:  Positive for arthralgias.  All other systems reviewed and are negative.  Objective:  Physical Exam Vitals reviewed.  HENT:     Head: Normocephalic.     Nose: Nose normal.     Mouth/Throat:     Mouth: Mucous membranes are moist.  Eyes:     Pupils: Pupils are equal, round, and reactive to light.  Cardiovascular:     Rate and Rhythm: Normal rate.     Pulses: Normal pulses.  Pulmonary:     Effort: Pulmonary effort is normal.  Abdominal:     General: Abdomen is flat.  Musculoskeletal:      Cervical back: Normal range of motion.  Skin:    General: Skin is warm.  Neurological:     General: No focal deficit present.     Mental Status: He is alert.  Psychiatric:        Mood and Affect: Mood normal.   Range of motion of the right knee demonstrates flexion 0-1 15.  Collateral and cruciate ligaments are stable.  Pedal pulses palpable.  Ankle dorsiflexion intact.  Skin intact in the right knee region   no groin pain  IVital signs in last 24 hours: Temp:  [97.8 F (36.6 C)] 97.8 F (36.6 C) (07/28 1006) Pulse Rate:  [96] 96 (07/28 1006) Resp:  [18] 18 (07/28 1006) BP: (146)/(82) 146/82 (07/28 1006) SpO2:  [98 %] 98 % (07/28 1006) Weight:  [137.2 kg] 137.2 kg (07/28 1006)  Labs:   Estimated body mass index is 41.03 kg/m as calculated from the following:   Height as of this encounter: 6' (1.829 m).   Weight as of this encounter: 137.2 kg.   Imaging Review Plain radiographs demonstrate severe degenerative joint disease of the right knee(s). The overall alignment ismild valgus. The bone quality appears to be good for age and reported activity level.      Assessment/Plan:  End stage arthritis, right knee   The patient history, physical examination, clinical judgment of the provider and imaging studies are consistent with end stage degenerative joint disease of the right knee(s) and total knee arthroplasty is deemed medically necessary. The treatment options including medical management, injection therapy arthroscopy and arthroplasty were discussed at length. The risks and benefits of total knee arthroplasty were presented and reviewed. The risks due to aseptic loosening, infection, stiffness, patella tracking problems, thromboembolic complications and other imponderables were discussed. The patient acknowledged the explanation, agreed to proceed with the plan and consent was signed. Patient is being admitted for inpatient treatment for surgery, pain control, PT, OT,  prophylactic antibiotics, VTE prophylaxis, progressive ambulation and ADL's and discharge planning. The patient is planning to be discharged home with home health services     Patient's anticipated LOS is less than 2 midnights, meeting these requirements: - Younger than 76 - Lives within 1 hour of care - Has a competent adult at home to recover with post-op recover - NO history of  - Chronic pain requiring opiods  - Diabetes  - Coronary Artery Disease  - Heart failure  - Heart attack  - Stroke  - DVT/VTE  - Cardiac arrhythmia  - Respiratory Failure/COPD  - Renal failure  - Anemia  - Advanced Liver disease

## 2020-10-28 NOTE — Brief Op Note (Signed)
   10/28/2020  4:11 PM  PATIENT:  [name].  60 y.o. male  PRE-OPERATIVE DIAGNOSIS:  right knee osteoarthritis  POST-OPERATIVE right knee arthritis [id number] PROCEDURE:  Procedure(s): RIGHT TOTAL KNEE ARTHROPLASTY  SURGEON:  Surgeon(s): Cammy Copa, MD  ASSISTANT: magnant pa  ANESTHESIA:   general  EBL: 75 ml    Total I/O In: 1000 [I.V.:1000] Out: -   BLOOD ADMINISTERED: none  DRAINS: none   LOCAL MEDICATIONS USED: Marcaine morphine clonidine Exparel SPECIMEN:  No Specimen  COUNTS:  YES  TOURNIQUET:   Total Tourniquet Time Documented: Thigh (Right) - 120 minutes Total: Thigh (Right) - 120 minutes   DICTATION: .Other Dictation: Dictation Number see above  PLAN OF CARE: Admit to inpatient   PATIENT DISPOSITION:  PACU - hemodynamically stable

## 2020-10-28 NOTE — Anesthesia Preprocedure Evaluation (Addendum)
Anesthesia Evaluation  Patient identified by MRN, date of birth, ID band Patient awake    Reviewed: Allergy & Precautions, NPO status , Patient's Chart, lab work & pertinent test results  History of Anesthesia Complications (+) PONV and history of anesthetic complications  Airway Mallampati: III  TM Distance: >3 FB Neck ROM: Full    Dental no notable dental hx.    Pulmonary sleep apnea ,    Pulmonary exam normal breath sounds clear to auscultation       Cardiovascular hypertension, negative cardio ROS Normal cardiovascular exam Rhythm:Regular Rate:Normal     Neuro/Psych  Headaches, negative psych ROS   GI/Hepatic negative GI ROS, Neg liver ROS,   Endo/Other  Morbid obesityprediabetes  Renal/GU negative Renal ROS  negative genitourinary   Musculoskeletal  (+) Arthritis ,   Abdominal   Peds negative pediatric ROS (+)  Hematology negative hematology ROS (+)   Anesthesia Other Findings   Reproductive/Obstetrics negative OB ROS                            Anesthesia Physical Anesthesia Plan  ASA: 2  Anesthesia Plan: Regional and General   Post-op Pain Management:  Regional for Post-op pain   Induction: Intravenous  PONV Risk Score and Plan: 2 and Propofol infusion, TIVA, Midazolam, Treatment may vary due to age or medical condition and Ondansetron  Airway Management Planned:   Additional Equipment:   Intra-op Plan:   Post-operative Plan: Extubation in OR  Informed Consent: I have reviewed the patients History and Physical, chart, labs and discussed the procedure including the risks, benefits and alternatives for the proposed anesthesia with the patient or authorized representative who has indicated his/her understanding and acceptance.     Dental advisory given  Plan Discussed with: CRNA and Anesthesiologist  Anesthesia Plan Comments: (Adductor canal block in preop for  postop pain control. Patient refusing spinal anesthetic despite benefits of better pain control and possible decreased blood loss. Patient is adamant and wishes to proceed with GETA.  Tanna Furry, MD  )       Anesthesia Quick Evaluation

## 2020-10-28 NOTE — Progress Notes (Signed)
Orthopedic Tech Progress Note Patient Details:  Ernest Navarro 1960/08/04 [id number]  [name] to service patient with CPM but patient has one in room already   Patient ID: [name]., male   DOB: May 12, 1960, 60 y.o.   MRN: [id number]  [name] 10/28/2020, 8:29 PM

## 2020-10-28 NOTE — Anesthesia Procedure Notes (Signed)
Procedure Name: Intubation Date/Time: 10/28/2020 12:37 PM Performed by: Claris Che, CRNA Pre-anesthesia Checklist: Patient identified, Emergency Drugs available, Suction available, Patient being monitored and Timeout performed Patient Re-evaluated:Patient Re-evaluated prior to induction Oxygen Delivery Method: Circle system utilized Preoxygenation: Pre-oxygenation with 100% oxygen Induction Type: IV induction and Cricoid Pressure applied Ventilation: Mask ventilation without difficulty Laryngoscope Size: Mac and 4 Grade View: Grade III Tube type: Oral Tube size: 8.0 mm Number of attempts: 1 Airway Equipment and Method: Stylet Placement Confirmation: ETT inserted through vocal cords under direct vision, positive ETCO2 and breath sounds checked- equal and bilateral Secured at: 25 cm Tube secured with: Tape Dental Injury: Teeth and Oropharynx as per pre-operative assessment

## 2020-10-28 NOTE — Op Note (Signed)
NAMERAGAN, REALE MEDICAL RECORD NO: [id number] ACCOUNT NO: [id number] DATE OF BIRTH: [birth year] FACILITY: [facility] LOCATION: [facility] PHYSICIAN: Graylin Shiver. August Saucer, MD  Operative Report   DATE OF PROCEDURE: 10/28/2020  PREOPERATIVE DIAGNOSIS:  Right knee arthritis.  POSTOPERATIVE DIAGNOSIS:  Right knee arthritis.  PROCEDURE:  Right total knee replacement using Stryker components press-fit size 5 femur, 5 tibia, 11 mm deep dish polyethylene insert, 38 mm 3-peg press-fit patella all cruciate retaining.  SURGEON:  Graylin Shiver. August Saucer, MD  ASSISTANT:  Karenann Cai, PA  INDICATIONS:  The patient is a 60 year old patient with right knee arthritis, end-stage, refractory to nonoperative management, who presents for operative management after explanation of risks and benefits.  DESCRIPTION OF PROCEDURE:  The patient was brought to the operating room where general anesthetic was induced.  Preoperative antibiotics were administered.  Timeout was called.  Right leg pre-scrubbed with alcohol and Betadine, and allowed to air dry,  prepped with DuraPrep solution and draped in a sterile manner.  Ernest Navarro was used to seal the operative field.  The patient had about a 10 degree flexion contracture, but bent to about 110.  Leg was covered with Ioban and elevated and exsanguinated with the  Esmarch wrap.  A timeout was called.  Anterior approach to knee was made.  Skin and subcutaneous tissue were sharply divided.  Median parapatellar approach was made and marked with #1 Vicryl suture.  Fat pad was partially excised.  IrriSept solution  used after the incision and after the arthrotomy and at multiple times during the case.  The patella was everted.  Lateral patellofemoral ligament was released.  Synovitis was removed.  Soft tissue anterior distal femur was removed.  Minimal medial soft  tissue dissection was performed.  Lateral soft tissue dissection was performed with a Cobb and cautery.  The patient had significant  lateral greater than medial tibial plateau wear.  Intramedullary alignment was then placed.  Anterior horn of the lateral  meniscus was released.  Posterior retractor placed.  The patient's tibia was cut 9 off the least affected medial tibial plateau.  The cut was made.  Collaterals and posterior neurovascular structures were protected.  A femoral cut was then made 5  degrees of valgus at 10 mm resection due to the patient's preoperative flexion contracture.  Both cuts were revised in order to facilitate placement of a 9 mm spacer in extension.  A total cut on the tibia 11 mm, total cut on the femur 12 mm.  Next, the  femur was sized to a size 5.  External rotation was increased to 4.5 degrees due to the patient's slightly deficient lateral femoral condyle.  This corresponded and was parallel to the epicondylar access and gave a rectangular flexion gap.  The anterior,  posterior and chamfer cuts were made.  The patient was able to reach full extension with good alignment with both the 9 and 11 mm spacer.  The tibia was then keel punched with appropriate rotation.  Tibia was placed.  The femur was placed.  Patella was  then cut from 27 to 16 mm.  Trial patellar button was placed.  With trial components in position the patient had full extension, full flexion, good alignment, excellent patellar tracking using no thumbs technique and good stability to varus and valgus  stress at 0, 30 and 90 degrees.  Overall, the alignment was intact.  Slightly more laxity to valgus testing and varus testing due to the patient's slight preoperative valgus deformity.  Next, thorough  irrigation was performed.  A transient TXA sponge was  allowed to sit in the incision for 3 minutes.  Capsule was anesthetized medially and laterally using a solution of Marcaine, saline and Exparel.  Next, these things were removed.  True components were then tapped into position.  Bone quality was  excellent.  Same stability parameters were  maintained.  Tourniquet was released.  Bleeding points encountered were controlled with electrocautery.  Thorough irrigation was then again performed using pouring irrigation.  Arthrotomy closed over bolster  using #1 Vicryl suture.  The IrriSept solution followed by vancomycin powder was then placed into the arthrotomy prior to final closure, then into the arthrotomy, we injected Marcaine, morphine and clonidine.  Next, vancomycin powder placed over the  superior aspect of the arthrotomy, which was then closed using 0 Vicryl suture, 2-0 Vicryl suture, 3-0 Monocryl and Steri-Strips.  Aquacel followed by Ace wrap.  Iceman and knee immobilizer.  The patient tolerated the procedure well without immediate  complications, was transferred to recovery room in stable condition.  Ernest Navarro's assistance was required at all times during the case for retraction and mobilization of the tissue, opening and closing.  His assistance was a medical necessity.   PUS D: 10/28/2020 4:19:31 pm T: 10/28/2020 8:16:00 pm  JOB: [id number]/ [id number]

## 2020-10-28 NOTE — Anesthesia Postprocedure Evaluation (Signed)
Anesthesia Post Note  Patient: Ernest Navarro.  Procedure(s) Performed: RIGHT TOTAL KNEE ARTHROPLASTY (Right: Knee)     Patient location during evaluation: PACU Anesthesia Type: Regional and General Level of consciousness: awake and alert, patient cooperative and oriented Pain management: pain level controlled (continuing to treat pain) Vital Signs Assessment: post-procedure vital signs reviewed and stable Respiratory status: spontaneous breathing, nonlabored ventilation, respiratory function stable and patient connected to nasal cannula oxygen Cardiovascular status: blood pressure returned to baseline and stable Postop Assessment: no apparent nausea or vomiting Anesthetic complications: no   No notable events documented.  Last Vitals:  Vitals:   10/28/20 1745 10/28/20 1751  BP: (!) 152/81   Pulse: 92 96  Resp: 11 15  Temp:    SpO2: 97% 100%    Last Pain:  Vitals:   10/28/20 1630  TempSrc:   PainSc: 10-Worst pain ever                 Raenette Sakata,E. Haelie Clapp

## 2020-10-28 NOTE — Transfer of Care (Signed)
Immediate Anesthesia Transfer of Care Note  Patient: Ernest Navarro.  Procedure(s) Performed: RIGHT TOTAL KNEE ARTHROPLASTY (Right: Knee)  Patient Location: PACU  Anesthesia Type:General  Level of Consciousness: awake, alert , drowsy and patient cooperative  Airway & Oxygen Therapy: Patient Spontanous Breathing and Patient connected to face mask oxygen  Post-op Assessment: Report given to RN, Post -op Vital signs reviewed and stable and Patient moving all extremities X 4  Post vital signs: Reviewed and stable  Last Vitals:  Vitals Value Taken Time  BP 113/78 10/28/20 1613  Temp    Pulse 100 10/28/20 1614  Resp 11 10/28/20 1614  SpO2 98 % 10/28/20 1614  Vitals shown include unvalidated device data.  Last Pain:  Vitals:   10/28/20 1110  TempSrc:   PainSc: 0-No pain         Complications: No notable events documented.

## 2020-10-29 ENCOUNTER — Encounter (HOSPITAL_COMMUNITY): Payer: Self-pay | Admitting: Orthopedic Surgery

## 2020-10-29 MED ORDER — DOCUSATE SODIUM 50 MG/5ML PO LIQD
100.0000 mg | Freq: Two times a day (BID) | ORAL | Status: DC
  Administered 2020-10-29 – 2020-10-31 (×3): 100 mg via ORAL
  Filled 2020-10-29 (×9): qty 10

## 2020-10-29 MED ORDER — HYDROMORPHONE HCL 2 MG PO TABS
4.0000 mg | ORAL_TABLET | ORAL | Status: DC | PRN
Start: 2020-10-29 — End: 2020-10-30
  Administered 2020-10-29 – 2020-10-30 (×4): 4 mg via ORAL
  Filled 2020-10-29 (×5): qty 2

## 2020-10-29 NOTE — Progress Notes (Signed)
PT Cancellation Note  Patient Details Name: Ernest Navarro. MRN: [id number] DOB: [birth year]   Cancelled Treatment:    Reason Eval/Treat Not Completed: Other (comment).  Pt was quite nauseated this AM, and after a length of time this afternoon is still unable to tolerate movement, and asks to wait until tomorrow.  Follow up as time and pt allow.   Ivar Drape 10/29/2020, 3:01 PM  Samul Dada, PT MS Acute Rehab Dept. Number: [facility] [id number] and [facility] [phone]

## 2020-10-29 NOTE — Progress Notes (Signed)
  Subjective: Ernest Navarro. is a 60 y.o. male s/p right TKA.  They are POD 1.  Pt's pain is controlled.  Pt denies numbness/tingling/weakness.  Pt has ambulated with some difficulty.  Denies any fevers, chills, night sweats, chest pain, shortness of breath, calf pain.  Objective: Vital signs in last 24 hours: Temp:  [98 F (36.7 C)-99.2 F (37.3 C)] 98.9 F (37.2 C) (07/29 1519) Pulse Rate:  [88-111] 103 (07/29 1519) Resp:  [11-19] 16 (07/29 1519) BP: (114-155)/(65-99) 123/65 (07/29 1519) SpO2:  [95 %-100 %] 98 % (07/29 1519)  Intake/Output from previous day: 07/28 0701 - 07/29 0700 In: 2244.5 [I.V.:2144.5; IV Piggyback:100] Out: 300 [Urine:200; Blood:100] Intake/Output this shift: Total I/O In: 240 [P.O.:240] Out: -   Exam:  No gross blood or drainage overlying the dressing 2+ DP pulse Sensation intact distally in the right foot Able to dorsiflex and plantarflex the right foot Unable to perform straight leg raise No calf tenderness.  Negative Homans' sign.   Labs: No results for input(s): HGB in the last 72 hours. No results for input(s): WBC, RBC, HCT, PLT in the last 72 hours. No results for input(s): NA, K, CL, CO2, BUN, CREATININE, GLUCOSE, CALCIUM in the last 72 hours. No results for input(s): LABPT, INR in the last 72 hours.  Assessment/Plan: Pt is POD 1 s/p right TKA.    -Plan to discharge to SNF in the coming days pending patient's pain and PT eval  -WBAT with a walker  -Follow-up with Dr. August Saucer 2 weeks postoperatively     Joycie Peek Ashe Gago 10/29/2020, 5:50 PM

## 2020-10-30 MED ORDER — HYDROMORPHONE HCL 2 MG PO TABS
4.0000 mg | ORAL_TABLET | ORAL | Status: DC
Start: 2020-10-30 — End: 2020-11-03
  Administered 2020-10-30 – 2020-11-03 (×25): 4 mg via ORAL
  Filled 2020-10-30 (×25): qty 2

## 2020-10-30 NOTE — Evaluation (Signed)
Physical Therapy Evaluation Patient Details Name: Ernest Navarro. MRN: [id number] DOB: 1960-08-31 Today's Date: 10/30/2020   History of Present Illness  Pt is a 59 y.o. male s/p R TKA 10/28/20. PMH: obesity, HTN, OA, gout  Clinical Impression  Pt is s/p TKA resulting in the deficits listed below (see PT Problem List). PTA pt lived alone, independent with mobility/ADLs. On eval, he required min assist bed mobility, min assist transfers, and min assist ambulation 10' with RW. R KI in place for ambulation. Distance limited by pain and dizziness. Vitals stable. Pt will benefit from skilled PT to increase their independence and safety with mobility to allow discharge to the venue listed below. Pt lives alone and has a flight of stairs to access his bed/bath. Per pt, plan is for ST SNF ([facility]) at d/c.      Follow Up Recommendations Follow surgeon's recommendation for DC plan and follow-up therapies    Equipment Recommendations  None recommended by PT    Recommendations for Other Services       Precautions / Restrictions Precautions Precautions: Fall;Knee Required Braces or Orthoses: Knee Immobilizer - Right Restrictions Weight Bearing Restrictions: Yes RLE Weight Bearing: Weight bearing as tolerated Other Position/Activity Restrictions: KI i room but no order or wear schedule in chart. KI donned for ambulation and removed at rest.      Mobility  Bed Mobility Overal bed mobility: Needs Assistance Bed Mobility: Supine to Sit     Supine to sit: Min assist;HOB elevated     General bed mobility comments: +rail, cues for sequencing, assist with RLE    Transfers Overall transfer level: Needs assistance Equipment used: Rolling walker (2 wheeled) Transfers: Sit to/from Stand Sit to Stand: Min assist;From elevated surface         General transfer comment: cues for hand placement/sequencing, assist to power up and stabilize initial balance  Ambulation/Gait Ambulation/Gait  assistance: Min assist Gait Distance (Feet): 10 Feet Assistive device: Rolling walker (2 wheeled) Gait Pattern/deviations: Step-to pattern;Antalgic;Decreased stride length Gait velocity: decreased   General Gait Details: Amb with RW bed to recliner. Cues for sequencing and posture. Assist to maintain balance and manage RW. KI in place.  Stairs            Wheelchair Mobility    Modified Rankin (Stroke Patients Only)       Balance Overall balance assessment: Needs assistance Sitting-balance support: Feet supported;No upper extremity supported Sitting balance-Leahy Scale: Good     Standing balance support: Bilateral upper extremity supported;During functional activity Standing balance-Leahy Scale: Poor Standing balance comment: reliant on external support                             Pertinent Vitals/Pain Pain Assessment: Faces Faces Pain Scale: Hurts little more Pain Location: R knee Pain Descriptors / Indicators: Grimacing;Operative site guarding;Discomfort Pain Intervention(s): Monitored during session;Repositioned;Limited activity within patient's tolerance;Ice applied;Premedicated before session    Home Living Family/patient expects to be discharged to:: Private residence Living Arrangements: Alone Available Help at Discharge: Family;Available PRN/intermittently Type of Home: House Home Access: Level entry     Home Layout: Bed/bath upstairs;Two level Home Equipment: Walker - 2 wheels;Bedside commode Additional Comments: DME delivered to house prior to sx    Prior Function Level of Independence: Independent               Hand Dominance        Extremity/Trunk Assessment   Upper Extremity  Assessment Upper Extremity Assessment: Overall WFL for tasks assessed    Lower Extremity Assessment Lower Extremity Assessment: RLE deficits/detail RLE Deficits / Details: s/p TKA, dressing in place over sx incision    Cervical / Trunk  Assessment Cervical / Trunk Assessment: Normal  Communication   Communication: No difficulties  Cognition Arousal/Alertness: Awake/alert Behavior During Therapy: WFL for tasks assessed/performed Overall Cognitive Status: Within Functional Limits for tasks assessed                                        General Comments General comments (skin integrity, edema, etc.): Max HR in 120s. Pt with c/o dizziness during mobility. BP stable. SpO2 99% on RA.    Exercises Total Joint Exercises Ankle Circles/Pumps: AROM;Both;10 reps;Supine Quad Sets: AROM;Both;5 reps;Supine Heel Slides: AAROM;Right;5 reps;Supine Goniometric ROM: 0-50 degrees R knee   Assessment/Plan    PT Assessment Patient needs continued PT services  PT Problem List Decreased strength;Decreased range of motion;Decreased mobility;Decreased knowledge of precautions;Decreased activity tolerance;Pain;Decreased balance;Decreased knowledge of use of DME       PT Treatment Interventions DME instruction;Therapeutic activities;Gait training;Therapeutic exercise;Patient/family education;Balance training;Stair training;Functional mobility training    PT Goals (Current goals can be found in the Care Plan section)  Acute Rehab PT Goals Patient Stated Goal: to walk PT Goal Formulation: With patient Time For Goal Achievement: 11/13/20 Potential to Achieve Goals: Good    Frequency 7X/week   Barriers to discharge Inaccessible home environment;Decreased caregiver support flight of steps to access bed/bath, lives alone    Co-evaluation               AM-PAC PT "6 Clicks" Mobility  Outcome Measure Help needed turning from your back to your side while in a flat bed without using bedrails?: A Little Help needed moving from lying on your back to sitting on the side of a flat bed without using bedrails?: A Little Help needed moving to and from a bed to a chair (including a wheelchair)?: A Little Help needed standing  up from a chair using your arms (e.g., wheelchair or bedside chair)?: A Little Help needed to walk in hospital room?: A Little Help needed climbing 3-5 steps with a railing? : A Lot 6 Click Score: 17    End of Session Equipment Utilized During Treatment: Gait belt;Right knee immobilizer Activity Tolerance: Patient tolerated treatment well Patient left: in chair;with call bell/phone within reach   PT Visit Diagnosis: Pain;Difficulty in walking, not elsewhere classified (R26.2) Pain - Right/Left: Right Pain - part of body: Knee    Time: 8250-5397 PT Time Calculation (min) (ACUTE ONLY): 32 min   Charges:   PT Evaluation $PT Eval Moderate Complexity: 1 Mod PT Treatments $Gait Training: 8-22 mins        Aida Raider, PT  Office # [phone] Pager [phone]   Ilda Foil 10/30/2020, 12:08 PM

## 2020-10-30 NOTE — Progress Notes (Signed)
Physical Therapy Treatment Patient Details Name: Ernest Navarro. MRN: [id number] DOB: Apr 03, 1961 Today's Date: 10/30/2020    History of Present Illness Pt is a 60 y.o. male s/p R TKA 10/28/20. PMH: obesity, HTN, OA, gout    PT Comments    Pt seen for PM session. Min assist transfers and min assist ambulation 10' x 2 with RW. LE exercises performed in recliner with feet elevated. Pt remained in recliner at end of session.    Follow Up Recommendations  Follow surgeon's recommendation for DC plan and follow-up therapies     Equipment Recommendations  None recommended by PT    Recommendations for Other Services       Precautions / Restrictions Precautions Precautions: Fall;Knee Required Braces or Orthoses: Knee Immobilizer - Right Restrictions Weight Bearing Restrictions: Yes RLE Weight Bearing: Weight bearing as tolerated Other Position/Activity Restrictions: KI i room but no order or wear schedule in chart. KI donned for ambulation and removed at rest.    Mobility  Bed Mobility Overal bed mobility: Needs Assistance Bed Mobility: Supine to Sit     Supine to sit: Min assist;HOB elevated     General bed mobility comments: +rail, cues for sequencing, assist with RLE    Transfers Overall transfer level: Needs assistance Equipment used: Rolling walker (2 wheeled) Transfers: Sit to/from Stand Sit to Stand: Min assist         General transfer comment: cues for sequencing, assist to power up and stabilize balance  Ambulation/Gait Ambulation/Gait assistance: Min assist Gait Distance (Feet): 10 Feet (x 2) Assistive device: Rolling walker (2 wheeled) Gait Pattern/deviations: Step-to pattern;Step-through pattern;Decreased stride length;Antalgic Gait velocity: decreased   General Gait Details: Amb recliner <> bathroom. Cues for sequencing. Assist to maintain balance. R KI in place.   Stairs             Wheelchair Mobility    Modified Rankin (Stroke  Patients Only)       Balance Overall balance assessment: Needs assistance Sitting-balance support: Feet supported;No upper extremity supported Sitting balance-Leahy Scale: Good     Standing balance support: Bilateral upper extremity supported;During functional activity Standing balance-Leahy Scale: Poor Standing balance comment: reliant on external support                            Cognition Arousal/Alertness: Awake/alert Behavior During Therapy: WFL for tasks assessed/performed Overall Cognitive Status: Within Functional Limits for tasks assessed                                        Exercises Total Joint Exercises Ankle Circles/Pumps: AROM;Both;10 reps Quad Sets: AROM;Both;10 reps Heel Slides: AAROM;Right;5 reps Hip ABduction/ADduction: AAROM;Right;10 reps Goniometric ROM: 0-50 degrees R knee    General Comments General comments (skin integrity, edema, etc.): Pt with continued c/o dizziness with stance/gait. VSS      Pertinent Vitals/Pain Pain Assessment: Faces Faces Pain Scale: Hurts little more Pain Location: R knee Pain Descriptors / Indicators: Grimacing;Operative site guarding;Discomfort Pain Intervention(s): Limited activity within patient's tolerance;Repositioned;Ice applied;Monitored during session    Home Living Family/patient expects to be discharged to:: Private residence Living Arrangements: Alone Available Help at Discharge: Family;Available PRN/intermittently Type of Home: House Home Access: Level entry   Home Layout: Bed/bath upstairs;Two level Home Equipment: Walker - 2 wheels;Bedside commode Additional Comments: DME delivered to house prior to sx  Prior Function Level of Independence: Independent          PT Goals (current goals can now be found in the care plan section) Acute Rehab PT Goals Patient Stated Goal: to walk PT Goal Formulation: With patient Time For Goal Achievement: 11/13/20 Potential to  Achieve Goals: Good Progress towards PT goals: Progressing toward goals    Frequency    7X/week      PT Plan      Co-evaluation              AM-PAC PT "6 Clicks" Mobility   Outcome Measure  Help needed turning from your back to your side while in a flat bed without using bedrails?: A Little Help needed moving from lying on your back to sitting on the side of a flat bed without using bedrails?: A Little Help needed moving to and from a bed to a chair (including a wheelchair)?: A Little Help needed standing up from a chair using your arms (e.g., wheelchair or bedside chair)?: A Little Help needed to walk in hospital room?: A Little Help needed climbing 3-5 steps with a railing? : A Lot 6 Click Score: 17    End of Session Equipment Utilized During Treatment: Gait belt;Right knee immobilizer Activity Tolerance: Patient tolerated treatment well Patient left: in chair;with call bell/phone within reach Nurse Communication: Mobility status PT Visit Diagnosis: Pain;Difficulty in walking, not elsewhere classified (R26.2) Pain - Right/Left: Right Pain - part of body: Knee     Time: 4854-6270 PT Time Calculation (min) (ACUTE ONLY): 29 min  Charges:  $Gait Training: 8-22 mins $Therapeutic Exercise: 8-22 mins                     Aida Raider, PT  Office # [phone] Pager [phone]    Ilda Foil 10/30/2020, 2:16 PM

## 2020-10-30 NOTE — Progress Notes (Signed)
  Subjective: Patient stable.  Pain reasonably well controlled on oral Dilaudid.   Objective: Vital signs in last 24 hours: Temp:  [97.8 F (36.6 C)-98.9 F (37.2 C)] 98.7 F (37.1 C) (07/30 0502) Pulse Rate:  [87-103] 103 (07/30 0502) Resp:  [16-18] 18 (07/30 0502) BP: (123-131)/(65-99) 127/99 (07/30 0502) SpO2:  [98 %-100 %] 100 % (07/30 0502)  Intake/Output from previous day: 07/29 0701 - 07/30 0700 In: 3086.1 [P.O.:1080; I.V.:1706.1; IV Piggyback:300] Out: 500 [Urine:500] Intake/Output this shift: No intake/output data recorded.  Exam:  Neurovascular intact Sensation intact distally  Labs: No results for input(s): HGB in the last 72 hours. No results for input(s): WBC, RBC, HCT, PLT in the last 72 hours. No results for input(s): NA, K, CL, CO2, BUN, CREATININE, GLUCOSE, CALCIUM in the last 72 hours. No results for input(s): LABPT, INR in the last 72 hours.  Assessment/Plan: Plan at this time is discontinue IV fluid.  Needs physical therapy to get him up and around the least into the chair.  Patient has been spending adequate time on CPM.  Anticipate discharge to skilled nursing on Monday.   G Scott Donnell Wion 10/30/2020, 8:02 AM

## 2020-10-31 NOTE — Progress Notes (Signed)
   Subjective: 3 Days Post-Op Procedure(s) (LRB): RIGHT TOTAL KNEE ARTHROPLASTY (Right) Patient reports pain as moderate.    Objective: Vital signs in last 24 hours: Temp:  [97.8 F (36.6 C)-98.9 F (37.2 C)] 97.8 F (36.6 C) (07/31 0728) Pulse Rate:  [96-110] 96 (07/31 0728) Resp:  [16-20] 18 (07/31 0728) BP: (122-157)/(63-92) 133/81 (07/31 0728) SpO2:  [92 %-97 %] 95 % (07/31 0728)  Intake/Output from previous day: 07/30 0701 - 07/31 0700 In: 360 [P.O.:360] Out: -  Intake/Output this shift: No intake/output data recorded.  No results for input(s): HGB in the last 72 hours. No results for input(s): WBC, RBC, HCT, PLT in the last 72 hours. No results for input(s): NA, K, CL, CO2, BUN, CREATININE, GLUCOSE, CALCIUM in the last 72 hours. No results for input(s): LABPT, INR in the last 72 hours.  Neurologically intact No results found.  Assessment/Plan: 3 Days Post-Op Procedure(s) (LRB): RIGHT TOTAL KNEE ARTHROPLASTY (Right) Discharge to SNF is his desire,he talked with [facility] before admission.   Eldred Manges 10/31/2020, 12:14 PM

## 2020-10-31 NOTE — Progress Notes (Signed)
Physical Therapy Treatment Patient Details Name: Ernest Navarro. MRN: [id number] DOB: 1960-07-29 Today's Date: 10/31/2020    History of Present Illness Pt is a 60 y.o. male s/p R TKA 10/28/20. PMH: obesity, HTN, OA, gout    PT Comments    Pt making steady progress overall with functional mobility as indicated by his decreased need for physical assistance and ability to ambulate a further distance this session. Pt would continue to benefit from skilled physical therapy services at this time while admitted and after d/c to address the below listed limitations in order to improve overall safety and independence with functional mobility.    Follow Up Recommendations  SNF;Other (comment) (plan for [facility] per pt)     Equipment Recommendations  None recommended by PT    Recommendations for Other Services       Precautions / Restrictions Precautions Precautions: Fall;Knee Restrictions Weight Bearing Restrictions: Yes RLE Weight Bearing: Weight bearing as tolerated    Mobility  Bed Mobility Overal bed mobility: Needs Assistance Bed Mobility: Sit to Supine       Sit to supine: Min assist   General bed mobility comments: assistance to return R LE onto bed    Transfers Overall transfer level: Needs assistance Equipment used: Rolling walker (2 wheeled) Transfers: Sit to/from Stand Sit to Stand: Supervision         General transfer comment: good technique utilized, supervision for safety  Ambulation/Gait Ambulation/Gait assistance: Min guard Gait Distance (Feet): 75 Feet Assistive device: Rolling walker (2 wheeled) Gait Pattern/deviations: Step-to pattern;Decreased step length - left;Decreased stance time - right;Decreased stride length;Decreased weight shift to right Gait velocity: decreased   General Gait Details: pt utilizing a slow, steady step-to gait pattern with good technique using RW for stability, min guard for safety, no overt LOB   Stairs              Wheelchair Mobility    Modified Rankin (Stroke Patients Only)       Balance Overall balance assessment: Needs assistance Sitting-balance support: Feet supported;No upper extremity supported Sitting balance-Leahy Scale: Good     Standing balance support: Bilateral upper extremity supported;During functional activity Standing balance-Leahy Scale: Poor Standing balance comment: reliant on external support                            Cognition Arousal/Alertness: Awake/alert Behavior During Therapy: WFL for tasks assessed/performed Overall Cognitive Status: Within Functional Limits for tasks assessed                                        Exercises      General Comments        Pertinent Vitals/Pain Pain Assessment: 0-10 Pain Score: 6  Pain Location: R knee Pain Descriptors / Indicators: Sore Pain Intervention(s): Monitored during session;Repositioned;Premedicated before session    Home Living                      Prior Function            PT Goals (current goals can now be found in the care plan section) Acute Rehab PT Goals PT Goal Formulation: With patient Time For Goal Achievement: 11/13/20 Potential to Achieve Goals: Good Progress towards PT goals: Progressing toward goals    Frequency    7X/week      PT  Plan Current plan remains appropriate    Co-evaluation              AM-PAC PT "6 Clicks" Mobility   Outcome Measure  Help needed turning from your back to your side while in a flat bed without using bedrails?: None Help needed moving from lying on your back to sitting on the side of a flat bed without using bedrails?: A Little Help needed moving to and from a bed to a chair (including a wheelchair)?: A Little Help needed standing up from a chair using your arms (e.g., wheelchair or bedside chair)?: A Little Help needed to walk in hospital room?: A Little Help needed climbing 3-5 steps with a  railing? : A Lot 6 Click Score: 18    End of Session Equipment Utilized During Treatment: Gait belt;Right knee immobilizer Activity Tolerance: Patient tolerated treatment well Patient left: in bed;in CPM;with call bell/phone within reach Nurse Communication: Mobility status PT Visit Diagnosis: Pain;Difficulty in walking, not elsewhere classified (R26.2) Pain - Right/Left: Right Pain - part of body: Knee     Time: 1135-1203 PT Time Calculation (min) (ACUTE ONLY): 28 min  Charges:  $Gait Training: 8-22 mins $Therapeutic Activity: 8-22 mins                     Arletta Bale, DPT  Acute Rehabilitation Services Office [phone]    Alessandra Bevels Genesis Paget 10/31/2020, 1:19 PM

## 2020-11-01 DIAGNOSIS — I10 Essential (primary) hypertension: Secondary | ICD-10-CM | POA: Diagnosis present

## 2020-11-01 DIAGNOSIS — J3089 Other allergic rhinitis: Secondary | ICD-10-CM | POA: Diagnosis present

## 2020-11-01 DIAGNOSIS — R7303 Prediabetes: Secondary | ICD-10-CM | POA: Diagnosis present

## 2020-11-01 DIAGNOSIS — G4733 Obstructive sleep apnea (adult) (pediatric): Secondary | ICD-10-CM | POA: Diagnosis present

## 2020-11-01 DIAGNOSIS — Z20822 Contact with and (suspected) exposure to covid-19: Secondary | ICD-10-CM | POA: Diagnosis present

## 2020-11-01 DIAGNOSIS — Z885 Allergy status to narcotic agent status: Secondary | ICD-10-CM | POA: Diagnosis not present

## 2020-11-01 DIAGNOSIS — M109 Gout, unspecified: Secondary | ICD-10-CM | POA: Diagnosis present

## 2020-11-01 DIAGNOSIS — M1711 Unilateral primary osteoarthritis, right knee: Secondary | ICD-10-CM | POA: Diagnosis present

## 2020-11-01 DIAGNOSIS — Z6841 Body Mass Index (BMI) 40.0 and over, adult: Secondary | ICD-10-CM | POA: Diagnosis not present

## 2020-11-01 NOTE — [place] FL2 (Signed)
[place] MEDICAID FL2 LEVEL OF CARE SCREENING TOOL     IDENTIFICATION  Patient Name: Ernest Navarro. Birthdate: May 16, 1960 Sex: male Admission Date (Current Location): 10/28/2020  [facility] and [place] Number:  Producer, television/film/video and Address:  [facility] [place], [address], [place], [place] [address]      Provider Number: [id number]  Attending Physician Name and Address:  Cammy Copa, MD  Relative Name and Phone Number:       Current Level of Care: Hospital Recommended Level of Care:   Prior Approval Number:    Date Approved/Denied:   PASRR Number: [id number]  Discharge Plan: SNF    Current Diagnoses: Patient Active Problem List   Diagnosis Date Noted   S/P total knee arthroplasty, right 10/28/2020   Obesity 06/29/2015   Obstructive sleep apnea 05/06/2015   Seasonal and perennial allergic rhinitis 05/06/2015   Hyperglycemia 06/19/2012   Essential hypertension, benign 06/19/2012   Gout 06/19/2012   Nocturia 06/19/2012    Orientation RESPIRATION BLADDER Height & Weight     Time, Self, Situation, Place  Normal Continent Weight: (!) 302 lb 8 oz (137.2 kg) Height:  6' (182.9 cm)  BEHAVIORAL SYMPTOMS/MOOD NEUROLOGICAL BOWEL NUTRITION STATUS      Continent Diet (See DC summary)  AMBULATORY STATUS COMMUNICATION OF NEEDS Skin   Limited Assist Verbally Normal                       Personal Care Assistance Level of Assistance  Bathing, Feeding, Dressing Bathing Assistance: Limited assistance Feeding assistance: Independent Dressing Assistance: Limited assistance     Functional Limitations Info  Hearing, Sight, Speech Sight Info: Adequate Hearing Info: Adequate Speech Info: Adequate    SPECIAL CARE FACTORS FREQUENCY  PT (By licensed PT), OT (By licensed OT)     PT Frequency: 5x a week OT Frequency: 5x a week            Contractures Contractures Info: Not present    Additional Factors Info  Code Status,  Allergies Code Status Info: Full Allergies Info: Oxycodone           Current Medications (11/01/2020):  This is the current hospital active medication list Current Facility-Administered Medications  Medication Dose Route Frequency Provider Last Rate Last Admin   acetaminophen (TYLENOL) tablet 325-650 mg  325-650 mg Oral Q6H PRN Magnant, Charles L, PA-C       amLODipine (NORVASC) tablet 10 mg  10 mg Oral Daily Magnant, Charles L, PA-C   10 mg at 11/01/20 1057   aspirin chewable tablet 81 mg  81 mg Oral BID Magnant, Charles L, PA-C   81 mg at 11/01/20 1057   celecoxib (CELEBREX) capsule 200 mg  200 mg Oral BID Magnant, Charles L, PA-C   200 mg at 11/01/20 1057   docusate (COLACE) 50 MG/5ML liquid 100 mg  100 mg Oral BID Scarlett Presto, RPH   100 mg at 10/31/20 6979   HYDROmorphone (DILAUDID) injection 0.5-1 mg  0.5-1 mg Intravenous Q4H PRN Cammy Copa, MD   1 mg at 10/29/20 1517   HYDROmorphone (DILAUDID) tablet 4 mg  4 mg Oral Q4H Cammy Copa, MD   4 mg at 11/01/20 1456   irbesartan (AVAPRO) tablet 300 mg  300 mg Oral Daily Magnant, Charles L, PA-C   300 mg at 11/01/20 1057   lactated ringers infusion   Intravenous Continuous Magnant, Charles L, PA-C 75 mL/hr at 10/29/20  2542 New Bag at 10/29/20 0850   menthol-cetylpyridinium (CEPACOL) lozenge 3 mg  1 lozenge Oral PRN Magnant, Charles L, PA-C       Or   phenol (CHLORASEPTIC) mouth spray 1 spray  1 spray Mouth/Throat PRN Magnant, Charles L, PA-C       methocarbamol (ROBAXIN) tablet 500 mg  500 mg Oral Q6H PRN Magnant, Charles L, PA-C   500 mg at 11/01/20 1456   Or   methocarbamol (ROBAXIN) 500 mg in dextrose 5 % 50 mL IVPB  500 mg Intravenous Q6H PRN Magnant, Charles L, PA-C 100 mL/hr at 10/29/20 1736 500 mg at 10/29/20 1736   metoCLOPramide (REGLAN) tablet 5-10 mg  5-10 mg Oral Q8H PRN Magnant, Charles L, PA-C       Or   metoCLOPramide (REGLAN) injection 5-10 mg  5-10 mg Intravenous Q8H PRN Magnant, Charles L, PA-C   10 mg at  10/28/20 2126   ondansetron (ZOFRAN) tablet 4 mg  4 mg Oral Q6H PRN Magnant, Charles L, PA-C       Or   ondansetron (ZOFRAN) injection 4 mg  4 mg Intravenous Q6H PRN Magnant, Charles L, PA-C   4 mg at 10/29/20 1734     Discharge Medications: Please see discharge summary for a list of discharge medications.  Relevant Imaging Results:  Relevant Lab Results:   Additional Information SSN: [id number], covid vaccinated with 2 boosters  Jimmy Picket, LCSW

## 2020-11-01 NOTE — TOC Initial Note (Signed)
Transition of Care (TOC) - Initial/Assessment Note    Patient Details  Name: Ernest Navarro. MRN: [id number] Date of Birth: Aug 21, 1960  Transition [facility] Care [facility]) CM/SW Contact:    Emeterio Reeve, LCSW Phone Number: 11/01/2020, 3:25 PM  Clinical Narrative:                  CSW received SNF consult. CSW met with pt at bedside. CSW introduced self and explained role at the hospital. Pt reports that PTA  he lived at home alone. PT reports he was independent with mobility and ADL's but was sometimes limited by pain. Pt reports he still drives and works.   CSW reviewed PT/OT recommendations for SNF. Pt reports he expected he would needed SNF and pre arranged to go to [facility].  CSW gave pt medicare.gov rating list to review. CSW explained insurance auth process. Pt reports they are covid vaccinated with 2 boosters.   CSW will continue to follow.   Expected Discharge Plan: Skilled Nursing Facility Barriers to Discharge: Continued Medical Work up, Ship broker   Patient Goals and CMS Choice Patient states their goals for this hospitalization and ongoing recovery are:: to beme independent again CMS Medicare.gov Compare Post Acute Care list provided to:: Patient Choice offered to / list presented to : Patient  Expected Discharge Plan and Services Expected Discharge Plan: [place]       Living arrangements for the past 2 months: Single Family Home                                      Prior Living Arrangements/Services Living arrangements for the past 2 months: Single Family Home Lives with:: Self Patient language and need for interpreter reviewed:: Yes Do you feel safe going back to the place where you live?: Yes      Need for Family Participation in Patient Care: Yes (Comment) Care giver support system in place?: Yes (comment)   Criminal Activity/Legal Involvement Pertinent to Current Situation/Hospitalization: No - Comment as  needed  Activities of Daily Living      Permission Sought/Granted Permission sought to share information with : Family Supports Permission granted to share information with : Yes, Verbal Permission Granted     Permission granted to share info w AGENCY: SNF        Emotional Assessment Appearance:: Appears stated age Attitude/Demeanor/Rapport: Engaged Affect (typically observed): Appropriate Orientation: : Oriented to Self, Oriented to Place, Oriented to  Time, Oriented to Situation Alcohol / Substance Use: Not Applicable Psych Involvement: No (comment)  Admission diagnosis:  S/P total knee arthroplasty, right [Z96.651] Patient Active Problem List   Diagnosis Date Noted   S/P total knee arthroplasty, right 10/28/2020   Obesity 06/29/2015   Obstructive sleep apnea 05/06/2015   Seasonal and perennial allergic rhinitis 05/06/2015   Hyperglycemia 06/19/2012   Essential hypertension, benign 06/19/2012   Gout 06/19/2012   Nocturia 06/19/2012   PCP:  Patient, No Pcp Per (Inactive) Pharmacy:   [id number] [place], [id number]: [phone]: [phone] [id number] [place], [place] [id number]: [phone]: [phone]   Social Determinants of Health (SDOH) Interventions    Readmission Risk Interventions No flowsheet data found.  [address]  [place] Social Worker [phone]

## 2020-11-01 NOTE — Progress Notes (Signed)
PT Cancellation Note  Patient Details Name: Ernest Navarro. MRN: [id number] DOB: 1960/04/19   Cancelled Treatment:    Reason Eval/Treat Not Completed: Patient declined, no reason specified. Attempted to see pt this AM; however, he was on the phone and requested that therapist return around noon. PT will continue to f/u and attempt to see pt as available.    Alessandra Bevels Rami Waddle 11/01/2020, 9:46 AM

## 2020-11-01 NOTE — Progress Notes (Signed)
Physical Therapy Treatment Patient Details Name: Ernest Navarro. MRN: [id number] DOB: [birth year] Today's Date: 11/01/2020    History of Present Illness Pt is a 60 y.o. male s/p R TKA 10/28/20. PMH: obesity, HTN, OA, gout    PT Comments    Pt continuing to progress well overall with functional mobility. Feel that he could progress to ambulating without the KI; however, pt very hesitant to attempt at this time. Pt would continue to benefit from skilled physical therapy services at this time while admitted and after d/c to address the below listed limitations in order to improve overall safety and independence with functional mobility.    Follow Up Recommendations  SNF     Equipment Recommendations  None recommended by PT    Recommendations for Other Services       Precautions / Restrictions Precautions Precautions: Fall;Knee Restrictions Weight Bearing Restrictions: Yes RLE Weight Bearing: Weight bearing as tolerated    Mobility  Bed Mobility               General bed mobility comments: pt OOB in recliner chair upon PT arrival    Transfers Overall transfer level: Needs assistance Equipment used: Rolling walker (2 wheeled) Transfers: Sit to/from Stand Sit to Stand: Supervision         General transfer comment: good technique utilized, supervision for safety  Ambulation/Gait Ambulation/Gait assistance: Min guard Gait Distance (Feet): 75 Feet Assistive device: Rolling walker (2 wheeled) Gait Pattern/deviations: Step-to pattern;Decreased step length - left;Decreased stance time - right;Decreased stride length;Decreased weight shift to right Gait velocity: decreased   General Gait Details: pt utilizing a slow, steady step-to gait pattern with good technique using RW for stability, min guard for safety, no overt LOB   Stairs             Wheelchair Mobility    Modified Rankin (Stroke Patients Only)       Balance Overall balance assessment: Needs  assistance Sitting-balance support: Feet supported;No upper extremity supported Sitting balance-Leahy Scale: Good     Standing balance support: Bilateral upper extremity supported;During functional activity Standing balance-Leahy Scale: Poor Standing balance comment: reliant on external support                            Cognition Arousal/Alertness: Awake/alert Behavior During Therapy: WFL for tasks assessed/performed Overall Cognitive Status: Within Functional Limits for tasks assessed                                        Exercises Total Joint Exercises Long Arc Quad: AROM;Strengthening;Right;10 reps;Seated Knee Flexion: AROM;Strengthening;Right;10 reps;Seated    General Comments        Pertinent Vitals/Pain Pain Assessment: 0-10 Pain Score: 6  Pain Location: R knee Pain Descriptors / Indicators: Sore Pain Intervention(s): Monitored during session;Repositioned    Home Living                      Prior Function            PT Goals (current goals can now be found in the care plan section) Acute Rehab PT Goals PT Goal Formulation: With patient Time For Goal Achievement: 11/13/20 Potential to Achieve Goals: Good Progress towards PT goals: Progressing toward goals    Frequency    7X/week      PT Plan Current plan remains  appropriate    Co-evaluation              AM-PAC PT "6 Clicks" Mobility   Outcome Measure  Help needed turning from your back to your side while in a flat bed without using bedrails?: None Help needed moving from lying on your back to sitting on the side of a flat bed without using bedrails?: A Little Help needed moving to and from a bed to a chair (including a wheelchair)?: A Little Help needed standing up from a chair using your arms (e.g., wheelchair or bedside chair)?: A Little Help needed to walk in hospital room?: A Little Help needed climbing 3-5 steps with a railing? : A Lot 6 Click  Score: 18    End of Session Equipment Utilized During Treatment: Gait belt;Right knee immobilizer Activity Tolerance: Patient tolerated treatment well Patient left: in chair;with call bell/phone within reach Nurse Communication: Mobility status PT Visit Diagnosis: Pain;Difficulty in walking, not elsewhere classified (R26.2) Pain - Right/Left: Right Pain - part of body: Knee     Time: 0626-9485 PT Time Calculation (min) (ACUTE ONLY): 16 min  Charges:  $Gait Training: 8-22 mins                     Arletta Bale, DPT  Acute Rehabilitation Services Office [phone]    Alessandra Bevels Babbette Dalesandro 11/01/2020, 12:32 PM

## 2020-11-02 NOTE — Progress Notes (Signed)
  Subjective: Ernest Navarro. is a 60 y.o. male s/p right TKA.  They are POD5.  Pt's pain is controlled.  Pt denies numbness/tingling/weakness.  Pt has ambulated with some difficulty.  PT is going well. Denies any complaints of chest pain, SOB, calf pain.  He has arrangement with [facility] to go to SNF  Objective: Vital signs in last 24 hours: Temp:  [97.8 F (36.6 C)-98.7 F (37.1 C)] 98 F (36.7 C) (08/02 1033) Pulse Rate:  [80-101] 101 (08/02 1033) Resp:  [16-18] 18 (08/02 1033) BP: (132-154)/(78-89) 154/89 (08/02 1033) SpO2:  [93 %-100 %] 100 % (08/02 1033)  Intake/Output from previous day: 08/01 0701 - 08/02 0700 In: -  Out: 200 [Urine:200] Intake/Output this shift: Total I/O In: 300 [P.O.:300] Out: -   Exam:  No gross blood or drainage overlying the dressing 2+ DP pulse Sensation intact distally in the right foot Able to dorsiflex and plantarflex the right foot No calf tenderness Able to perform straight leg raise multiple times with some extensor lag  Labs: No results for input(s): HGB in the last 72 hours. No results for input(s): WBC, RBC, HCT, PLT in the last 72 hours. No results for input(s): NA, K, CL, CO2, BUN, CREATININE, GLUCOSE, CALCIUM in the last 72 hours. No results for input(s): LABPT, INR in the last 72 hours.  Assessment/Plan: Pt is POD5 s/p right TKA.    -Plan to discharge to SNF in coming days pending patient's pain and PT eval  -WBAT with a walker   Coralynn Gaona L Christin Mccreedy 11/02/2020, 12:20 PM

## 2020-11-02 NOTE — Progress Notes (Signed)
PT Cancellation Note  Patient Details Name: Ernest Navarro. MRN: [id number] DOB: 05/05/1960   Cancelled Treatment:    Reason Eval/Treat Not Completed: (P) Patient declined, no reason specified (check on patient @ 1458 and he was eating and asked PTA to return.  Returned @ 1640 and patient adamantly refused therapy due to blisters on his R inner thigh near his knee.  He reports he was told not to push it if he was uncomfortable. Pt not receptive to education or encouragement.  Informed patient I would let his provider know his complaints.)   Florestine Avers 11/02/2020, 4:41 PM  Bonney Leitz , PTA Acute Rehabilitation Services Pager [phone] Office [phone]

## 2020-11-02 NOTE — TOC Progression Note (Signed)
Transition of Care (TOC) - Progression Note    Patient Details  Name: Ernest Navarro. MRN: [id number] Date of Birth: 14-Aug-1960  [facility]) CM/SW Contact  Jimmy Picket, [place] Phone Number: 11/02/2020, 3:09 PM  Clinical Narrative:     [facility] accept pt. Insurance has been starting. Pt can DC when auth Is received. CSW requested covid test from MD.   Expected Discharge Plan: Skilled Nursing Facility Barriers to Discharge: Continued Medical Work up, English as a second language teacher  Expected Discharge Plan and Services Expected Discharge Plan: Skilled Nursing Facility       Living arrangements for the past 2 months: Single Family Home                                       Social Determinants of Health (SDOH) Interventions    Readmission Risk Interventions No flowsheet data found.

## 2020-11-03 ENCOUNTER — Other Ambulatory Visit: Payer: Self-pay

## 2020-11-03 LAB — SARS CORONAVIRUS 2 (TAT 6-24 HRS): SARS Coronavirus 2: NEGATIVE

## 2020-11-03 MED ORDER — METHOCARBAMOL 500 MG PO TABS: 500.0000 mg | ORAL_TABLET | Freq: Three times a day (TID) | ORAL | 0 refills | Status: DC | PRN

## 2020-11-03 MED ORDER — DOCUSATE SODIUM 50 MG/5ML PO LIQD: 100.0000 mg | Freq: Two times a day (BID) | ORAL | 0 refills | Status: DC

## 2020-11-03 MED ORDER — HYDROMORPHONE HCL 4 MG PO TABS: 2.0000 mg | ORAL_TABLET | Freq: Four times a day (QID) | ORAL | 0 refills | Status: DC | PRN

## 2020-11-03 MED ORDER — ASPIRIN 81 MG PO CHEW: 81.0000 mg | CHEWABLE_TABLET | Freq: Two times a day (BID) | ORAL | 0 refills | Status: DC

## 2020-11-03 NOTE — Progress Notes (Signed)
[name]. to be D/C'd per MD order. Discussed with the patient and all questions fully answered. ? VSS, Skin clean, dry and intact without evidence of skin break down, no evidence of skin tears noted. ? An After Visit Summary was printed and placed in the discharge packet for Ernest Navarro. ? Report called to receiving nurse at facility.  ? Patient to be escorted via strectcher, and D/C to [facility] via [facility].

## 2020-11-03 NOTE — TOC Transition Note (Signed)
Transition of Care [facility]) - CM/SW Discharge Note   Patient Details  Name: Ernest Navarro. MRN: [id number] Date of Birth: 09-14-1960  Transition of Care [facility]) CM/SW Contact:  Jimmy Picket, LCSW Phone Number: 11/03/2020, 12:11 PM   Clinical Narrative:     Patient will DC to: [facility] Anticipated DC date: 11/03/20 Family notified: none Transport by: Sharin Mons     Per MD patient ready for DC to [facility]. RN, patient, patient's family, and facility notified of DC. Discharge Summary and FL2 sent to facility.DC packet on chart. [facility] has received insurance auth. Pt is covid negative. Ambulance transport requested for patient.    RN to call report to [phone]. Pt will go to room 7015.  CSW will sign off for now as social work intervention is no longer needed. Please consult [place] again if new needs arise.   Final next level of care: Skilled Nursing Facility Barriers to Discharge: Barriers Resolved   Patient Goals and CMS Choice Patient states their goals for this hospitalization and ongoing recovery are:: to beme independent again CMS Medicare.gov Compare Post Acute Care list provided to:: Patient Choice offered to / list presented to : Patient  Discharge Placement              Patient chooses bed at: [facility] Patient to be transferred to facility by: [facility] Name of family member notified: none Patient and family notified of of transfer: 11/03/20  Discharge Plan and Services                                     Social Determinants of Health (SDOH) Interventions     Readmission Risk Interventions No flowsheet data found.  Jimmy Picket, Theresia Majors, [place] Clinical Social Worker [phone]

## 2020-11-03 NOTE — Progress Notes (Signed)
Physical Therapy Treatment Patient Details Name: Ernest Navarro. MRN: [id number] DOB: [birth year] Today's Date: 11/03/2020    History of Present Illness Pt is a 60 y.o. male s/p R TKA 10/28/20. PMH: obesity, HTN, OA, gout    PT Comments    Pt continuing to make steady progress overall with functional mobility and tolerated hallway ambulation with RW and supervision for safety. Pt would continue to benefit from skilled physical therapy services at this time while admitted and after d/c to address the below listed limitations in order to improve overall safety and independence with functional mobility.    Follow Up Recommendations  SNF     Equipment Recommendations  None recommended by PT    Recommendations for Other Services       Precautions / Restrictions Precautions Precautions: Fall;Knee Restrictions Weight Bearing Restrictions: Yes RLE Weight Bearing: Weight bearing as tolerated    Mobility  Bed Mobility Overal bed mobility: Needs Assistance Bed Mobility: Supine to Sit     Supine to sit: Supervision     General bed mobility comments: HOB elevated, use of bed rail, supervision for safety    Transfers Overall transfer level: Needs assistance Equipment used: Rolling walker (2 wheeled) Transfers: Sit to/from Stand Sit to Stand: Supervision         General transfer comment: good technique utilized, supervision for safety  Ambulation/Gait Ambulation/Gait assistance: Supervision Gait Distance (Feet): 110 Feet Assistive device: Rolling walker (2 wheeled) Gait Pattern/deviations: Step-to pattern;Decreased step length - left;Decreased stance time - right;Decreased stride length;Decreased weight shift to right Gait velocity: decreased   General Gait Details: pt utilizing a slow, steady step-to gait pattern with good technique using RW for stability, min guard for safety, no overt LOB   Stairs             Wheelchair Mobility    Modified Rankin  (Stroke Patients Only)       Balance Overall balance assessment: Needs assistance Sitting-balance support: Feet supported;No upper extremity supported Sitting balance-Leahy Scale: Good     Standing balance support: No upper extremity supported;During functional activity Standing balance-Leahy Scale: Fair                              Cognition Arousal/Alertness: Awake/alert Behavior During Therapy: WFL for tasks assessed/performed Overall Cognitive Status: Within Functional Limits for tasks assessed                                        Exercises Total Joint Exercises Straight Leg Raises: Strengthening;Right;10 reps;Seated Long Arc Quad: Strengthening;Right;10 reps;Seated Knee Flexion: Strengthening;Right;10 reps;Seated Goniometric ROM: R knee flex AROM = 75 degrees; R knee ext AROM = lacking 5 degrees to neutral Marching in Standing: Strengthening;Right;10 reps;Standing    General Comments        Pertinent Vitals/Pain Pain Assessment: 0-10 Pain Score: 5  Pain Location: R knee Pain Descriptors / Indicators: Sore Pain Intervention(s): Monitored during session;Repositioned    Home Living                      Prior Function            PT Goals (current goals can now be found in the care plan section) Acute Rehab PT Goals PT Goal Formulation: With patient Time For Goal Achievement: 11/13/20 Potential to Achieve Goals: Good Progress  towards PT goals: Progressing toward goals    Frequency    7X/week      PT Plan Current plan remains appropriate    Co-evaluation              AM-PAC PT "6 Clicks" Mobility   Outcome Measure  Help needed turning from your back to your side while in a flat bed without using bedrails?: None Help needed moving from lying on your back to sitting on the side of a flat bed without using bedrails?: None Help needed moving to and from a bed to a chair (including a wheelchair)?: None Help  needed standing up from a chair using your arms (e.g., wheelchair or bedside chair)?: None Help needed to walk in hospital room?: None Help needed climbing 3-5 steps with a railing? : A Little 6 Click Score: 23    End of Session Equipment Utilized During Treatment: Gait belt Activity Tolerance: Patient tolerated treatment well Patient left: in chair;with call bell/phone within reach Nurse Communication: Mobility status PT Visit Diagnosis: Pain;Difficulty in walking, not elsewhere classified (R26.2) Pain - Right/Left: Right Pain - part of body: Knee     Time: 0911-0929 PT Time Calculation (min) (ACUTE ONLY): 18 min  Charges:  $Gait Training: 8-22 mins                     Arletta Bale, DPT  Acute Rehabilitation Services Office [phone]    [name] 11/03/2020, 10:03 AM

## 2020-11-03 NOTE — Discharge Summary (Signed)
Physician Discharge Summary      Patient ID: Ernest Navarro. MRN: [id number] DOB/AGE: [birth year] 60 y.o.  Admit date: 10/28/2020 Discharge date: 11/03/20  Admission Diagnoses:  Active Problems:   S/P total knee arthroplasty, right   Discharge Diagnoses:  Same  Surgeries: Procedure(s): RIGHT TOTAL KNEE ARTHROPLASTY on 10/28/2020   Consultants:   Discharged Condition: Stable  Hospital Course: Ernest Navarro. is an 60 y.o. male who was admitted 10/28/2020 with a chief complaint of right knee pain, and found to have a diagnosis of right knee OA.  They were brought to the operating room on 10/28/2020 and underwent the above named procedures.  Pt awoke from anesthesia without complication and was transferred to the floor. On POD1, patient's pain was controlled but he had some difficulty with mobilization with PT. Made significant progress over the next several days to the point he was able to ambulate >100 feet on the last day of hospitalization. Denied CP, SOB, calf pain throughout his stay. No sign of infection and he was able to perform multiple straight leg raises without difficulty on last day. He was discharged to SNF on 11/03/20.  Pt will f/u with Dr. August Saucer in clinic in ~1 weeks.   Antibiotics given:  Anti-infectives (From admission, onward)    Start     Dose/Rate Route Frequency Ordered Stop   10/28/20 2200  ceFAZolin (ANCEF) IVPB 2g/100 mL premix        2 g 200 mL/hr over 30 Minutes Intravenous Every 8 hours 10/28/20 1936 10/29/20 0543   10/28/20 1450  vancomycin (VANCOCIN) powder  Status:  Discontinued          As needed 10/28/20 1450 10/28/20 1609   10/28/20 1045  ceFAZolin (ANCEF) IVPB 3g/100 mL premix        3 g 200 mL/hr over 30 Minutes Intravenous On call to O.R. 10/28/20 1035 10/28/20 1248   10/28/20 1025  ceFAZolin (ANCEF) 2-4 GM/100ML-% IVPB  Status:  Discontinued       Note to Pharmacy: Launa Flight   : cabinet override      10/28/20 1025 10/28/20 1039      .  Recent vital signs:  Vitals:   11/03/20 0509 11/03/20 1216  BP: 136/78 (!) 151/79  Pulse: 81 86  Resp: 16 18  Temp: 98.4 F (36.9 C) 97.6 F (36.4 C)  SpO2: 93% 99%    Recent laboratory studies:  Results for orders placed or performed during the hospital encounter of 10/28/20  SARS CORONAVIRUS 2 (TAT 6-24 HRS) Nasopharyngeal Nasopharyngeal Swab   Specimen: Nasopharyngeal Swab  Result Value Ref Range   SARS Coronavirus 2 NEGATIVE NEGATIVE  Glucose, capillary  Result Value Ref Range   Glucose-Capillary 126 (H) 70 - 99 mg/dL  Glucose, capillary  Result Value Ref Range   Glucose-Capillary 119 (H) 70 - 99 mg/dL  Glucose, capillary  Result Value Ref Range   Glucose-Capillary 120 (H) 70 - 99 mg/dL    Discharge Medications:   Allergies as of 11/03/2020       Reactions   Oxycodone Nausea Only   Makes him "delirious"        Medication List     STOP taking these medications    traMADol 50 MG tablet Commonly known as: ULTRAM       TAKE these medications    acetaminophen 500 MG tablet Commonly known as: TYLENOL Take 1,000 mg by mouth every 6 (six) hours as needed for moderate pain.  amLODipine 10 MG tablet Commonly known as: NORVASC Take 10 mg by mouth daily.   aspirin 81 MG chewable tablet Chew 1 tablet (81 mg total) by mouth 2 (two) times daily.   atorvastatin 20 MG tablet Commonly known as: LIPITOR Take 20 mg by mouth daily.   docusate 50 MG/5ML liquid Commonly known as: COLACE Take 10 mLs (100 mg total) by mouth 2 (two) times daily.   HYDROmorphone 4 MG tablet Commonly known as: DILAUDID Take 0.5-1 tablets (2-4 mg total) by mouth every 6 (six) hours as needed for severe pain.   meloxicam 15 MG tablet Commonly known as: MOBIC TAKE 1 TABLET (15 MG TOTAL) BY MOUTH DAILY.   metFORMIN 500 MG tablet Commonly known as: GLUCOPHAGE Take 500 mg by mouth daily with breakfast.   methocarbamol 500 MG tablet Commonly known as: ROBAXIN Take 1 tablet  (500 mg total) by mouth every 8 (eight) hours as needed for muscle spasms.   Rybelsus 7 MG Tabs Generic drug: Semaglutide Take 7 mg by mouth every morning.   telmisartan 80 MG tablet Commonly known as: MICARDIS Take 80 mg by mouth daily.        Diagnostic Studies: No results found.  Disposition: Discharge disposition: 03-Skilled Nursing Facility       Discharge Instructions     Call MD / Call 911   Complete by: As directed    If you experience chest pain or shortness of breath, CALL 911 and be transported to the hospital emergency room.  If you develope a fever above 101 F, pus (white drainage) or increased drainage or redness at the wound, or calf pain, call your surgeon's office.   Constipation Prevention   Complete by: As directed    Drink plenty of fluids.  Prune juice may be helpful.  You may use a stool softener, such as Colace (over the counter) 100 mg twice a day.  Use MiraLax (over the counter) for constipation as needed.   Diet - low sodium heart healthy   Complete by: As directed    Discharge instructions   Complete by: As directed    You may shower, dressing is waterproof.  Do not remove the dressing, we will remove it at your first post-op appointment.  Do not take a bath or soak the knee in a tub or pool.  You may weightbear as you can tolerate on the operative leg with a walker.  Continue using the CPM machine 3 times per day for one hour each time, increasing the degrees of range of motion daily.  Use the blue cradle boot under your heel to work on getting your leg straight.  Do NOT put a pillow under your knee.  You will follow-up with Dr. August Saucerean in the clinic in 2 weeks at your given appointment date.    INSTRUCTIONS AFTER JOINT REPLACEMENT   Remove items at home which could result in a fall. This includes throw rugs or furniture in walking pathways ICE to the affected joint every three hours while awake for 30 minutes at a time, for at least the first 3-5  days, and then as needed for pain and swelling.  Continue to use ice for pain and swelling. You may notice swelling that will progress down to the foot and ankle.  This is normal after surgery.  Elevate your leg when you are not up walking on it.   Continue to use the breathing machine you got in the hospital (incentive spirometer) which will help  keep your temperature down.  It is common for your temperature to cycle up and down following surgery, especially at night when you are not up moving around and exerting yourself.  The breathing machine keeps your lungs expanded and your temperature down.   DIET:  As you were doing prior to hospitalization, we recommend a well-balanced diet.  DRESSING / WOUND CARE / SHOWERING  Keep the surgical dressing until follow up.  The dressing is water proof, so you can shower without any extra covering.  IF THE DRESSING FALLS OFF or the wound gets wet inside, change the dressing with sterile gauze.  Please use good hand washing techniques before changing the dressing.  Do not use any lotions or creams on the incision until instructed by your surgeon.    ACTIVITY  Increase activity slowly as tolerated, but follow the weight bearing instructions below.   No driving for 6 weeks or until further direction given by your physician.  You cannot drive while taking narcotics.  No lifting or carrying greater than 10 lbs. until further directed by your surgeon. Avoid periods of inactivity such as sitting longer than an hour when not asleep. This helps prevent blood clots.  You may return to work once you are authorized by your doctor.     WEIGHT BEARING   Weight bearing as tolerated with assist device (walker, cane, etc) as directed, use it as long as suggested by your surgeon or therapist, typically at least 4-6 weeks.   EXERCISES  Results after joint replacement surgery are often greatly improved when you follow the exercise, range of motion and muscle  strengthening exercises prescribed by your doctor. Safety measures are also important to protect the joint from further injury. Any time any of these exercises cause you to have increased pain or swelling, decrease what you are doing until you are comfortable again and then slowly increase them. If you have problems or questions, call your caregiver or physical therapist for advice.   Rehabilitation is important following a joint replacement. After just a few days of immobilization, the muscles of the leg can become weakened and shrink (atrophy).  These exercises are designed to build up the tone and strength of the thigh and leg muscles and to improve motion. Often times heat used for twenty to thirty minutes before working out will loosen up your tissues and help with improving the range of motion but do not use heat for the first two weeks following surgery (sometimes heat can increase post-operative swelling).   These exercises can be done on a training (exercise) mat, on the floor, on a table or on a bed. Use whatever works the best and is most comfortable for you.    Use music or television while you are exercising so that the exercises are a pleasant break in your day. This will make your life better with the exercises acting as a break in your routine that you can look forward to.   Perform all exercises about fifteen times, three times per day or as directed.  You should exercise both the operative leg and the other leg as well.  Exercises include:   Quad Sets - Tighten up the muscle on the front of the thigh (Quad) and hold for 5-10 seconds.   Straight Leg Raises - With your knee straight (if you were given a brace, keep it on), lift the leg to 60 degrees, hold for 3 seconds, and slowly lower the leg.  Perform this exercise  against resistance later as your leg gets stronger.  Leg Slides: Lying on your back, slowly slide your foot toward your buttocks, bending your knee up off the floor (only go  as far as is comfortable). Then slowly slide your foot back down until your leg is flat on the floor again.  Angel Wings: Lying on your back spread your legs to the side as far apart as you can without causing discomfort.  Hamstring Strength:  Lying on your back, push your heel against the floor with your leg straight by tightening up the muscles of your buttocks.  Repeat, but this time bend your knee to a comfortable angle, and push your heel against the floor.  You may put a pillow under the heel to make it more comfortable if necessary.   A rehabilitation program following joint replacement surgery can speed recovery and prevent re-injury in the future due to weakened muscles. Contact your doctor or a physical therapist for more information on knee rehabilitation.    CONSTIPATION  Constipation is defined medically as fewer than three stools per week and severe constipation as less than one stool per week.  Even if you have a regular bowel pattern at home, your normal regimen is likely to be disrupted due to multiple reasons following surgery.  Combination of anesthesia, postoperative narcotics, change in appetite and fluid intake all can affect your bowels.   YOU MUST use at least one of the following options; they are listed in order of increasing strength to get the job done.  They are all available over the counter, and you may need to use some, POSSIBLY even all of these options:    Drink plenty of fluids (prune juice may be helpful) and high fiber foods Colace 100 mg by mouth twice a day  Senokot for constipation as directed and as needed Dulcolax (bisacodyl), take with full glass of water  Miralax (polyethylene glycol) once or twice a day as needed.  If you have tried all these things and are unable to have a bowel movement in the first 3-4 days after surgery call either your surgeon or your primary doctor.    If you experience loose stools or diarrhea, hold the medications until you  stool forms back up.  If your symptoms do not get better within 1 week or if they get worse, check with your doctor.  If you experience "the worst abdominal pain ever" or develop nausea or vomiting, please contact the office immediately for further recommendations for treatment.   ITCHING:  If you experience itching with your medications, try taking only a single pain pill, or even half a pain pill at a time.  You can also use Benadryl over the counter for itching or also to help with sleep.   TED HOSE STOCKINGS:  Use stockings on both legs until for at least 2 weeks or as directed by physician office. They may be removed at night for sleeping.  MEDICATIONS:  See your medication summary on the "After Visit Summary" that nursing will review with you.  You may have some home medications which will be placed on hold until you complete the course of blood thinner medication.  It is important for you to complete the blood thinner medication as prescribed.  PRECAUTIONS:  If you experience chest pain or shortness of breath - call 911 immediately for transfer to the hospital emergency department.   If you develop a fever greater that 101 F, purulent drainage from wound, increased  redness or drainage from wound, foul odor from the wound/dressing, or calf pain - CONTACT YOUR SURGEON.                                                   FOLLOW-UP APPOINTMENTS:  If you do not already have a post-op appointment, please call the office for an appointment to be seen by your surgeon.  Guidelines for how soon to be seen are listed in your "After Visit Summary", but are typically between 1-4 weeks after surgery.  OTHER INSTRUCTIONS:   Knee Replacement:  Do not place pillow under knee, focus on keeping the knee straight while resting. CPM instructions: 0-90 degrees, 2 hours in the morning, 2 hours in the afternoon, and 2 hours in the evening. Place foam block, curve side up under heel at all times except when in CPM or  when walking.  DO NOT modify, tear, cut, or change the foam block in any way.  POST-OPERATIVE OPIOID TAPER INSTRUCTIONS: It is important to wean off of your opioid medication as soon as possible. If you do not need pain medication after your surgery it is ok to stop day one. Opioids include: Codeine, Hydrocodone(Norco, Vicodin), Oxycodone(Percocet, oxycontin) and hydromorphone amongst others.  Long term and even short term use of opiods can cause: Increased pain response Dependence Constipation Depression Respiratory depression And more.  Withdrawal symptoms can include Flu like symptoms Nausea, vomiting And more Techniques to manage these symptoms Hydrate well Eat regular healthy meals Stay active Use relaxation techniques(deep breathing, meditating, yoga) Do Not substitute Alcohol to help with tapering If you have been on opioids for less than two weeks and do not have pain than it is ok to stop all together.  Plan to wean off of opioids This plan should start within one week post op of your joint replacement. Maintain the same interval or time between taking each dose and first decrease the dose.  Cut the total daily intake of opioids by one tablet each day Next start to increase the time between doses. The last dose that should be eliminated is the evening dose.   MAKE SURE YOU:  Understand these instructions.  Get help right away if you are not doing well or get worse.    Thank you for letting us be a part of your medical care team.  It is a privilege we respect greatly.  We hope these instructions will help you stay on track for a fast and full recovery!    Dental Antibiotics:  In most cases prophylactic antibiotics for Dental procdeures after total joint surgery are not necessary.  Exceptions are as follows:  1. History of prior total joint infection  2. Severely immunocompromised (Organ Transplant, cancer chemotherapy, Rheumatoid biologic meds such as  Humera)  3. Poorly controlled diabetes (A1C &gt; 8.0, blood glucose over 200)  If you have one of these conditions, contact your surgeon for an antibiotic prescription, prior to your dental procedure.   Increase activity slowly as tolerated   Complete by: As directed    Post-operative opioid taper instructions:   Complete by: As directed    POST-OPERATIVE OPIOID TAPER INSTRUCTIONS: It is important to wean off of your opioid medication as soon as possible. If you do not need pain medication after your surgery it is ok to stop day one. Opioids include:  Codeine, Hydrocodone(Norco, Vicodin), Oxycodone(Percocet, oxycontin) and hydromorphone amongst others.  Long term and even short term use of opiods can cause: Increased pain response Dependence Constipation Depression Respiratory depression And more.  Withdrawal symptoms can include Flu like symptoms Nausea, vomiting And more Techniques to manage these symptoms Hydrate well Eat regular healthy meals Stay active Use relaxation techniques(deep breathing, meditating, yoga) Do Not substitute Alcohol to help with tapering If you have been on opioids for less than two weeks and do not have pain than it is ok to stop all together.  Plan to wean off of opioids This plan should start within one week post op of your joint replacement. Maintain the same interval or time between taking each dose and first decrease the dose.  Cut the total daily intake of opioids by one tablet each day Next start to increase the time between doses. The last dose that should be eliminated is the evening dose.             SignedJulieanne Cotton 11/03/2020, 12:46 PM

## 2020-11-04 ENCOUNTER — Telehealth: Payer: Self-pay

## 2020-11-04 NOTE — Telephone Encounter (Signed)
Please see message below. Patient is s/p R TKA preformed on 10/28/2020. Concerns of stiffness due to not having a machine at facility to help with his knee movement.

## 2020-11-04 NOTE — Telephone Encounter (Signed)
Pt called stating that Ernest Navarro doesn't have the machine to move his knee and he would like to know if we can put a order in for one.  FYI Pt is already noticing stiffness from not using it.

## 2020-11-07 DIAGNOSIS — M1711 Unilateral primary osteoarthritis, right knee: Secondary | ICD-10-CM

## 2020-11-08 MED ORDER — HYDROMORPHONE HCL 2 MG PO TABS: 2.0000 mg | ORAL_TABLET | Freq: Four times a day (QID) | ORAL | 0 refills | Status: DC | PRN

## 2020-11-08 MED ORDER — METHOCARBAMOL 500 MG PO TABS: 500.0000 mg | ORAL_TABLET | Freq: Three times a day (TID) | ORAL | 0 refills | Status: DC | PRN

## 2020-11-08 NOTE — Telephone Encounter (Signed)
Refilled meds.  Changed dilaudid to 2mg  q6h from 2-4mg  q6h

## 2020-11-09 NOTE — Telephone Encounter (Signed)
Ernest Navarro with [facility] called regarding pts CMP machine. They still haven't received one there yet.   CB # [phone] - Ernest Navarro - case worker

## 2020-11-10 NOTE — Telephone Encounter (Signed)
I have messaged Kipp Brood asking him to help/advise

## 2020-11-10 NOTE — Telephone Encounter (Signed)
I spoke with Ernest Navarro at [facility] and he was able to get this situated for patient today.

## 2020-11-11 ENCOUNTER — Telehealth: Payer: Self-pay | Admitting: Orthopedic Surgery

## 2020-11-11 NOTE — Telephone Encounter (Signed)
Pt calling from rehab facility stating sonographer saw something on his scan and did not know if they should continue P.T at this point for today. Pt does have an appt for tomorrow at 10:30 with August Saucer. The best call back number is [phone] and the phone number for his room at the facility is [phone].

## 2020-11-12 ENCOUNTER — Other Ambulatory Visit: Payer: Self-pay

## 2020-11-12 ENCOUNTER — Ambulatory Visit (INDEPENDENT_AMBULATORY_CARE_PROVIDER_SITE_OTHER): Payer: BC Managed Care – PPO

## 2020-11-12 ENCOUNTER — Ambulatory Visit (INDEPENDENT_AMBULATORY_CARE_PROVIDER_SITE_OTHER): Payer: BC Managed Care – PPO | Admitting: Orthopedic Surgery

## 2020-11-12 ENCOUNTER — Encounter: Payer: Self-pay | Admitting: Orthopedic Surgery

## 2020-11-12 DIAGNOSIS — Z96651 Presence of right artificial knee joint: Secondary | ICD-10-CM

## 2020-11-12 NOTE — Progress Notes (Signed)
Post-Op Visit Note   Patient: Ernest Navarro.           Date of Birth: Dec 18, 1960           MRN: [id number] Visit Date: 11/12/2020 PCP: Patient, No Pcp Per (Inactive)   Assessment & Plan:  Chief Complaint:  Chief Complaint  Patient presents with   Right Knee - Routine Post Op   Visit Diagnoses:  1. Status post total right knee replacement     Plan: [name] is a 60 year old patient who is now 2 weeks out right total knee replacement.  He has been ambulating but is having expected amount of pain.  He is taking Dilaudid for pain and a muscle relaxer.  He had a recent ultrasound which was negative for DVT.  He is at [facility] rehab.  He has 15 steps to navigate at home.  He can do 3 steps currently.  He is weaning his Dilaudid to take less frequently.  On examination he has range of motion to 90 and can do leg extension.  Incision intact.  No calf tenderness today.  Plan at this time is to continue with therapy to really focus on strengthening so he can navigate his stairs.  I anticipate a least 1-1-1/2 more weeks in rehab.  Would like to see him back in a week just to reassess his strength and range of motion and potentially plan for some type of discharge to outpatient therapy at that time.  Follow-Up Instructions: Return in about 1 week (around 11/19/2020).   Orders:  Orders Placed This Encounter  Procedures   XR Knee 1-2 Views Right   No orders of the defined types were placed in this encounter.   Imaging: XR Knee 1-2 Views Right  Result Date: 11/12/2020 AP lateral radiographs right knee reviewed.  Total knee prosthesis in good position alignment with no complicating features.  No fracture.  Patella height normal relative to distal femur.   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   Obesity 06/29/2015   Obstructive sleep apnea 05/06/2015   Seasonal and perennial  allergic rhinitis 05/06/2015   Hyperglycemia 06/19/2012   Essential hypertension, benign 06/19/2012   Gout 06/19/2012   Nocturia 06/19/2012   Past Medical History:  Diagnosis Date   Arthritis    bilateral knees   Essential hypertension, benign 06/19/2012   Gout 06/19/2012   Headache    Hyperglycemia 06/19/2012   March 2014 A1c 6.5 (repeat needed)    Pneumonia    PONV (postoperative nausea and vomiting)    Pre-diabetes    Sleep apnea     Family History  Problem Relation Age of Onset   Colon cancer Neg Hx     Past Surgical History:  Procedure Laterality Date   KNEE SURGERY Right    x3 arthroscopies   TOTAL KNEE ARTHROPLASTY Right 10/28/2020   Procedure: RIGHT TOTAL KNEE ARTHROPLASTY;  Surgeon: Cammy Copa, MD;  Location: [facility] OR;  Service: Orthopedics;  Laterality: Right;   Social History   Occupational History   Not on file  Tobacco Use   Smoking status: Never   Smokeless tobacco: Never  Vaping Use   Vaping Use: Never used  Substance and Sexual Activity   Alcohol use: No    Alcohol/week: 0.0 standard drinks   Drug use: No   Sexual activity: Not on file

## 2020-11-12 NOTE — Telephone Encounter (Signed)
Pls find study thx not under cv studies

## 2020-11-15 NOTE — Telephone Encounter (Signed)
This remains a mystery

## 2020-11-19 ENCOUNTER — Other Ambulatory Visit: Payer: Self-pay

## 2020-11-19 ENCOUNTER — Ambulatory Visit (INDEPENDENT_AMBULATORY_CARE_PROVIDER_SITE_OTHER): Payer: BC Managed Care – PPO | Admitting: Surgical

## 2020-11-19 ENCOUNTER — Encounter: Payer: Self-pay | Admitting: Surgical

## 2020-11-19 DIAGNOSIS — Z96651 Presence of right artificial knee joint: Secondary | ICD-10-CM

## 2020-11-19 MED ORDER — GABAPENTIN 300 MG PO CAPS: 300.0000 mg | ORAL_CAPSULE | Freq: Three times a day (TID) | ORAL | 0 refills | Status: DC

## 2020-11-19 NOTE — Progress Notes (Addendum)
Post-Op Visit Note   Patient: Ernest Navarro.           Date of Birth: 04-20-1960           MRN: [id number] Visit Date: 11/19/2020 PCP: Patient, No Pcp Per (Inactive)   Assessment & Plan:  Chief Complaint:  Chief Complaint  Patient presents with   Right Knee - Post-op Follow-up   Visit Diagnoses:  1. Status post total right knee replacement     Plan: Patient is a 60 year old male who presents s/p right total knee arthroplasty on 10/28/2020.  He is still at skilled nursing facility.  Feels he is progressing well with physical therapy.  Is able to sleep through the night.  He has been working on his quad strength and range of motion in therapy and is able to ride the exercise bike rotating his legs all the way around the bike without much difficulty.  He has been working on ascending and descending stairs.  He has been measured up to 90 degrees in physical therapy.  He is decreasing the amount of Dilaudid that he is taking and feels he is progressing well with weaning off of the pain medicine though it is somewhat increasing his pain.  He also takes Tylenol as well as muscle relaxer for symptomatic relief.  Patient also continues to complain of paresthesias and pain that travel in the anterior lateral left thigh.  No improvement since last appointment.  He has had symptoms since procedure.  Suspect he is dealing with meralgia paresthetica.  On exam, patient has 0 degrees extension and about 90 degrees of knee flexion.  Incision looks to be healing well without any evidence of infection or dehiscence.  No calf tenderness.  Negative Homans' sign.  He is able to perform straight leg raise fairly easily for about 10 reps before his leg starts to fatigue.  No extensor lag.  No knee effusion noted.  Plan to have patient continue therapy at skilled nursing facility with likely discharge next Wednesday.  They have assessments by physical therapy every Tuesday and suspect that after their  assessment and after 5 more days of physical therapy, patient will be ready to mobilize well enough with physical therapy to return home.  Major hurdle is the 10-13 stairs he has going up to his second floor.  He is agreeable to this plan and feels that he will feel ready at that point.  Follow-up with Dr. August Saucer in 4 weeks for clinical recheck.  Plan to try gabapentin for suspected meralgia paresthetica and see if this will provide symptomatic relief for patient.  He will call the office if no improvement.  He will need 2-3 months of dedicated outpatient physical therapy from the time of his surgery date.  This is necessary to ensure patient achieves optimal range of motion and quadricep strength.  Follow-Up Instructions: No follow-ups on file.   Orders:  No orders of the defined types were placed in this encounter.  No orders of the defined types were placed in this encounter.   Imaging: No results found.  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   Obesity 06/29/2015   Obstructive sleep apnea 05/06/2015   Seasonal and perennial allergic rhinitis 05/06/2015   Hyperglycemia 06/19/2012   Essential hypertension, benign 06/19/2012   Gout 06/19/2012   Nocturia 06/19/2012   Past Medical History:  Diagnosis  Date   Arthritis    bilateral knees   Essential hypertension, benign 06/19/2012   Gout 06/19/2012   Headache    Hyperglycemia 06/19/2012   March 2014 A1c 6.5 (repeat needed)    Pneumonia    PONV (postoperative nausea and vomiting)    Pre-diabetes    Sleep apnea     Family History  Problem Relation Age of Onset   Colon cancer Neg Hx     Past Surgical History:  Procedure Laterality Date   KNEE SURGERY Right    x3 arthroscopies   TOTAL KNEE ARTHROPLASTY Right 10/28/2020   Procedure: RIGHT TOTAL KNEE ARTHROPLASTY;  Surgeon: Cammy Copa, MD;  Location: [facility] OR;  Service:  Orthopedics;  Laterality: Right;   Social History   Occupational History   Not on file  Tobacco Use   Smoking status: Never   Smokeless tobacco: Never  Vaping Use   Vaping Use: Never used  Substance and Sexual Activity   Alcohol use: No    Alcohol/week: 0.0 standard drinks   Drug use: No   Sexual activity: Not on file

## 2020-11-22 ENCOUNTER — Telehealth: Payer: Self-pay

## 2020-11-22 ENCOUNTER — Encounter: Payer: Self-pay | Admitting: Orthopedic Surgery

## 2020-11-22 DIAGNOSIS — Z96651 Presence of right artificial knee joint: Secondary | ICD-10-CM

## 2020-11-22 NOTE — Telephone Encounter (Signed)
Patient called today and states that he would like recommendations for outpatient therapy facilities. He is s/p R TKA preformed on 10/28/2020

## 2020-11-22 NOTE — Telephone Encounter (Signed)
Pt called and stated he wants to go the Rehab center at [facility]. The phone number for them is [phone] and the fax number is [phone]. The best call back number for the pt is [phone].

## 2020-11-22 NOTE — Telephone Encounter (Signed)
He should come here or [facility] either 1 will work.  Please call and find out what she wants.  Thanks

## 2020-11-22 NOTE — Telephone Encounter (Signed)
Patient called and stated he wants to go the Rehab center at [facility]. Please advise if I am able to drop referral to begin outpatient therapy

## 2020-11-22 NOTE — Telephone Encounter (Signed)
Yes for knee rom and strengthening and patellar mobility 2 - 3 x a week for 6 weeks thx

## 2020-11-22 NOTE — Telephone Encounter (Signed)
error 

## 2020-11-23 NOTE — Telephone Encounter (Signed)
Referral has been placed for patients requested outpatient therapy facility.

## 2020-11-23 NOTE — Addendum Note (Signed)
Addended by: Roxan Hockey on: 11/23/2020 04:08 PM   Modules accepted: Orders

## 2020-11-25 ENCOUNTER — Telehealth: Payer: Self-pay | Admitting: Orthopedic Surgery

## 2020-11-25 NOTE — Telephone Encounter (Signed)
Please advise is patient is needing to continue CPM machine. S/P R TKA preformed on 10/28/2020

## 2020-11-25 NOTE — Telephone Encounter (Signed)
Ernest Navarro is s/p right total knee arthroplasty.  States he was just discharged from [place].  He is calling because there was a CPM delivered (which he used) to [place] and another one delivered to his home at the same time.  So, the CPM has been at his home since discharge and the authorization has expired.  If he continues to need the CPM for home use, the auth will need to be extended.

## 2020-11-25 NOTE — Telephone Encounter (Signed)
Yes until easy 90 degrees thx pls clal

## 2020-11-29 NOTE — Telephone Encounter (Signed)
Sent in order for extension.

## 2020-11-29 NOTE — Telephone Encounter (Signed)
Is this something that you would be able to show me how to complete when the time comes. He will need a new authorization for an extension for his CPM machine

## 2020-12-03 ENCOUNTER — Telehealth: Payer: Self-pay

## 2020-12-03 NOTE — Telephone Encounter (Signed)
Pt called into the office stating that his pain level has increased and would like a call back since the pain medication isn't helping

## 2020-12-05 ENCOUNTER — Other Ambulatory Visit: Payer: Self-pay | Admitting: Surgical

## 2020-12-05 MED ORDER — HYDROMORPHONE HCL 2 MG PO TABS: ORAL_TABLET | ORAL | 0 refills | Status: DC

## 2020-12-05 MED ORDER — GABAPENTIN 300 MG PO CAPS: 300.0000 mg | ORAL_CAPSULE | Freq: Three times a day (TID) | ORAL | 0 refills | Status: DC

## 2020-12-05 NOTE — Telephone Encounter (Signed)
Called him, he's doing well.  Pain overall controlled now. Refilled RXs

## 2020-12-17 ENCOUNTER — Other Ambulatory Visit: Payer: Self-pay

## 2020-12-17 ENCOUNTER — Ambulatory Visit (INDEPENDENT_AMBULATORY_CARE_PROVIDER_SITE_OTHER): Payer: BC Managed Care – PPO | Admitting: Orthopedic Surgery

## 2020-12-17 DIAGNOSIS — M170 Bilateral primary osteoarthritis of knee: Secondary | ICD-10-CM

## 2020-12-17 DIAGNOSIS — M1711 Unilateral primary osteoarthritis, right knee: Secondary | ICD-10-CM

## 2020-12-19 ENCOUNTER — Encounter: Payer: Self-pay | Admitting: Orthopedic Surgery

## 2020-12-19 DIAGNOSIS — M1712 Unilateral primary osteoarthritis, left knee: Secondary | ICD-10-CM

## 2020-12-19 MED ORDER — BUPIVACAINE HCL 0.25 % IJ SOLN
4.0000 mL | INTRAMUSCULAR | Status: AC | PRN
  Administered 2020-12-19: 4 mL via INTRA_ARTICULAR

## 2020-12-19 MED ORDER — LIDOCAINE HCL 1 % IJ SOLN
5.0000 mL | INTRAMUSCULAR | Status: AC | PRN
  Administered 2020-12-19: 5 mL

## 2020-12-19 NOTE — Progress Notes (Signed)
Office Visit Note   Patient: Ernest Navarro.           Date of Birth: 01-Dec-1960           MRN: [id number] Visit Date: 12/17/2020 Requested by: No referring provider defined for this encounter. PCP: Patient, No Pcp Per (Inactive)  Subjective: Chief Complaint  Patient presents with   Right Knee - Routine Post Op    10/28/20 Right TKA    HPI: Ernest Navarro is a 60 year old patient who underwent right total knee replacement 10/20/2020.  He is going to therapy 2 times a week.  He is transitioning from a cane to no assistive devices.  Taking Dilaudid before and after physical therapy.  Has therapy set up through [facility]28.  Patient also reports left knee pain.  Takes gabapentin for that which has not been helpful.  Has some occasional low back pain.              ROS: All systems reviewed are negative as they relate to the chief complaint within the history of present illness.  Patient denies  fevers or chills.   Assessment & Plan: Visit Diagnoses:  1. Primary osteoarthritis of both knees     Plan: Impression is well-functioning right total knee replacement.  Continue with therapy to achieve higher range of motion and strength.  Current range of motion 0-1 12.  Left knee also has arthritis.  Ernest Navarro wants to try to delay his need for knee replacement with that.  6 weeks out think it is okay to do Toradol injection.  Return in 6 weeks and we will consider cortisone injection for the left knee.  He will be out of work through the end of October due to bilateral knee pain and diminished walking and standing endurance.  Follow-up in 6 weeks for clinical recheck  Follow-Up Instructions: Return in about 4 weeks (around 01/14/2021).   Orders:  No orders of the defined types were placed in this encounter.  No orders of the defined types were placed in this encounter.     Procedures: Large Joint Inj: L knee on 12/19/2020 10:46 AM Indications: diagnostic evaluation, joint swelling and pain Details: 18  G 1.5 in needle, superolateral approach  Arthrogram: No  Medications: 5 mL lidocaine 1 %; 4 mL bupivacaine 0.25 % Outcome: tolerated well, no immediate complications Procedure, treatment alternatives, risks and benefits explained, specific risks discussed. Consent was given by the patient. Immediately prior to procedure a time out was called to verify the correct patient, procedure, equipment, support staff and site/side marked as required. Patient was prepped and draped in the usual sterile fashion.    1 cc Toradol injected with a Marcaine  Clinical Data: No additional findings.  Objective: Vital Signs: There were no vitals taken for this visit.  Physical Exam:   Constitutional: Patient appears well-developed HEENT:  Head: Normocephalic Eyes:EOM are normal Neck: Normal range of motion Cardiovascular: Normal rate Pulmonary/chest: Effort normal Neurologic: Patient is alert Skin: Skin is warm Psychiatric: Patient has normal mood and affect   Ortho Exam: Ortho exam demonstrates range of motion on the right knee 0-1 12 with good stability intact extensor mechanism and stable collateral ligaments.  Left knee has mild effusion with good range of motion medial and lateral joint line tenderness and mild patellofemoral crepitus.  No groin pain with internal or external rotation on either side.  Specialty Comments:  10/25/20 - wants contralateral knee aspirated at time of surgery, understands no injection is possible  Imaging: No results found.   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   Obesity 06/29/2015   Obstructive sleep apnea 05/06/2015   Seasonal and perennial allergic rhinitis 05/06/2015   Hyperglycemia 06/19/2012   Essential hypertension, benign 06/19/2012   Gout 06/19/2012   Nocturia 06/19/2012   Past Medical History:  Diagnosis Date   Arthritis    bilateral knees    Essential hypertension, benign 06/19/2012   Gout 06/19/2012   Headache    Hyperglycemia 06/19/2012   March 2014 A1c 6.5 (repeat needed)    Pneumonia    PONV (postoperative nausea and vomiting)    Pre-diabetes    Sleep apnea     Family History  Problem Relation Age of Onset   Colon cancer Neg Hx     Past Surgical History:  Procedure Laterality Date   KNEE SURGERY Right    x3 arthroscopies   TOTAL KNEE ARTHROPLASTY Right 10/28/2020   Procedure: RIGHT TOTAL KNEE ARTHROPLASTY;  Surgeon: Cammy Copa, MD;  Location: [facility] OR;  Service: Orthopedics;  Laterality: Right;   Social History   Occupational History   Not on file  Tobacco Use   Smoking status: Never   Smokeless tobacco: Never  Vaping Use   Vaping Use: Never used  Substance and Sexual Activity   Alcohol use: No    Alcohol/week: 0.0 standard drinks   Drug use: No   Sexual activity: Not on file

## 2020-12-27 ENCOUNTER — Other Ambulatory Visit: Payer: Self-pay | Admitting: Surgical

## 2020-12-27 MED ORDER — HYDROMORPHONE HCL 2 MG PO TABS: 2.0000 mg | ORAL_TABLET | Freq: Three times a day (TID) | ORAL | 0 refills | Status: DC | PRN

## 2021-01-03 ENCOUNTER — Telehealth: Payer: Self-pay | Admitting: Orthopedic Surgery

## 2021-01-03 DIAGNOSIS — R269 Unspecified abnormalities of gait and mobility: Secondary | ICD-10-CM

## 2021-01-03 NOTE — Telephone Encounter (Signed)
Pt calling stating he would like to go ahead and get the referral in that he and Dr. August Saucer discussed earlier. The best call back number is [phone].

## 2021-01-04 NOTE — Telephone Encounter (Signed)
Not sure can u remind me to call him am thx

## 2021-01-06 ENCOUNTER — Other Ambulatory Visit: Payer: Self-pay | Admitting: Surgical

## 2021-01-06 ENCOUNTER — Telehealth: Payer: Self-pay | Admitting: Orthopedic Surgery

## 2021-01-06 MED ORDER — HYDROMORPHONE HCL 2 MG PO TABS: 2.0000 mg | ORAL_TABLET | Freq: Two times a day (BID) | ORAL | 0 refills | Status: DC | PRN

## 2021-01-06 NOTE — Telephone Encounter (Signed)
Sent in refilled.  Changed frequency to q12 instead of q8

## 2021-01-06 NOTE — Telephone Encounter (Signed)
Referral placed. Pt advised done

## 2021-01-06 NOTE — Telephone Encounter (Signed)
Pt called stating he discussed a neurology referral with Dr.Dean. He said a specific name wasn't mentioned though. Pt would like a CB to be updated on having the referral sent in.   [phone]

## 2021-01-06 NOTE — Telephone Encounter (Signed)
Pt would like to know if he could get a refill on dilaudid.  Took last one last night  CB [phone]

## 2021-01-06 NOTE — Telephone Encounter (Signed)
Ok to rf to neurology thx may be a while  eval balance issues thx

## 2021-01-07 ENCOUNTER — Encounter: Payer: Self-pay | Admitting: Neurology

## 2021-01-07 NOTE — Telephone Encounter (Signed)
LMOM for patient of the below message  

## 2021-01-14 ENCOUNTER — Other Ambulatory Visit: Payer: Self-pay

## 2021-01-14 ENCOUNTER — Ambulatory Visit: Payer: Self-pay

## 2021-01-14 ENCOUNTER — Ambulatory Visit (INDEPENDENT_AMBULATORY_CARE_PROVIDER_SITE_OTHER): Payer: BC Managed Care – PPO | Admitting: Orthopedic Surgery

## 2021-01-14 DIAGNOSIS — G5712 Meralgia paresthetica, left lower limb: Secondary | ICD-10-CM | POA: Diagnosis not present

## 2021-01-14 DIAGNOSIS — M1712 Unilateral primary osteoarthritis, left knee: Secondary | ICD-10-CM

## 2021-01-14 DIAGNOSIS — R269 Unspecified abnormalities of gait and mobility: Secondary | ICD-10-CM

## 2021-01-14 DIAGNOSIS — Z96651 Presence of right artificial knee joint: Secondary | ICD-10-CM

## 2021-01-16 ENCOUNTER — Encounter: Payer: Self-pay | Admitting: Orthopedic Surgery

## 2021-01-16 NOTE — Progress Notes (Signed)
Post-Op Visit Note   Patient: Ernest Navarro.           Date of Birth: [birth year]           MRN: [id number] Visit Date: 01/14/2021 PCP: Patient, No Pcp Per (Inactive)   Assessment & Plan:  Chief Complaint:  Chief Complaint  Patient presents with   Right Knee - Follow-up, Pain    10/28/2020 right TKA   Left Knee - Follow-up, Pain   Visit Diagnoses:  1. Gait abnormality     Plan: Patient is a 60 year old male who presents s/p right total knee arthroplasty on 10/28/2020.  He also had left knee Toradol injection on 12/19/2020 for left knee arthritis.  He returns for evaluation of his right knee and reports that he has completely discontinued Dilaudid.  He is taking tramadol about every 12 hours instead.  Ernest Navarro endorses continued swelling of the right knee especially if he is unable to elevate it; this is progressively improving through slowly.  He feels he is doing well in physical therapy.  He is up to 109 degrees in PT.  They are mostly working on strengthening, balance, manual physical therapy.  He denies having any real difficulty standing up from a toilet seat or navigating stairs.  Overall, he is satisfied with the result in his right knee so far and he considers the right knee his "good knee".  Left knee is causing him more discomfort than the right knee and he had some relief from the Toradol injection but that has worn off at this point.  He is also having continued difficulty with the meralgia paresthetica symptoms in the left thigh.  Typically this is a self-limited problem that only last for about a month or 2 but with his persistent symptoms, offered Marcaine injection around the course of the lateral femoral cutaneous nerve near the iliac crest.  He would like to try this as the meralgia paresthetica symptoms are very distressing to him.  Under ultrasound guidance approximately 20 to 25 cc of Marcaine was administered around the iliac crest in the vicinity of the lateral femoral  cutaneous nerve.  He will pay attention to his symptoms particularly over the next 24 to 48 hours to see how much relief he gets.  On examination today, right knee has well-healed incision with no evidence of infection or dehiscence.  He has no effusion in the right knee.  0 degrees extension and about 105 degrees of knee flexion on exam today.  No calf tenderness, negative Homans' sign.  Patient is able to perform straight leg raise without difficulty or extensor lag.  He has excellent quadricep strength against resistance.  Had long discussion with Ernest Navarro today about where to go with the left knee from here.  After lengthy discussion, he wants to postpone surgery for now and would like to try left knee cortisone injection.  Cortisone injection administered and patient tolerated the procedure well.  Plan for him to follow-up with the office as needed with likely follow-up in the near future to discuss left total knee arthroplasty scheduling.  He wants to hold off on any surgery until after his golf tournament in March 2022.  Follow-Up Instructions: No follow-ups on file.   Orders:  Orders Placed This Encounter  Procedures   US Guided Needle Placement - No Linked Charges   No orders of the defined types were placed in this encounter.   Imaging: No results found.  PMFS History: Patient Active Problem List  Diagnosis Date Noted   Arthritis of right knee    S/P total knee arthroplasty, right 10/28/2020   Obesity 06/29/2015   Obstructive sleep apnea 05/06/2015   Seasonal and perennial allergic rhinitis 05/06/2015   Hyperglycemia 06/19/2012   Essential hypertension, benign 06/19/2012   Gout 06/19/2012   Nocturia 06/19/2012   Past Medical History:  Diagnosis Date   Arthritis    bilateral knees   Essential hypertension, benign 06/19/2012   Gout 06/19/2012   Headache    Hyperglycemia 06/19/2012   March 2014 A1c 6.5 (repeat needed)    Pneumonia    PONV (postoperative nausea and  vomiting)    Pre-diabetes    Sleep apnea     Family History  Problem Relation Age of Onset   Colon cancer Neg Hx     Past Surgical History:  Procedure Laterality Date   KNEE SURGERY Right    x3 arthroscopies   TOTAL KNEE ARTHROPLASTY Right 10/28/2020   Procedure: RIGHT TOTAL KNEE ARTHROPLASTY;  Surgeon: Cammy Copa, MD;  Location: [facility] OR;  Service: Orthopedics;  Laterality: Right;   Social History   Occupational History   Not on file  Tobacco Use   Smoking status: Never   Smokeless tobacco: Never  Vaping Use   Vaping Use: Never used  Substance and Sexual Activity   Alcohol use: No    Alcohol/week: 0.0 standard drinks   Drug use: No   Sexual activity: Not on file

## 2021-01-16 NOTE — Progress Notes (Signed)
   Procedure Note  Patient: Ernest Navarro.             Date of Birth: [birth year]           MRN: [id number]             Visit Date: 01/14/2021  Procedures: Visit Diagnoses:  1. Status post total right knee replacement   2. Gait abnormality   3. Unilateral primary osteoarthritis, left knee     Large Joint Inj: L knee on 01/14/2021 11:37 AM Indications: diagnostic evaluation, joint swelling and pain Details: 18 G 1.5 in needle, superolateral approach  Arthrogram: No  Medications: 5 mL lidocaine 1 %; 40 mg methylPREDNISolone acetate 40 MG/ML; 4 mL bupivacaine 0.25 % Aspirate: 15 mL Outcome: tolerated well, no immediate complications Procedure, treatment alternatives, risks and benefits explained, specific risks discussed. Consent was given by the patient. Immediately prior to procedure a time out was called to verify the correct patient, procedure, equipment, support staff and site/side marked as required. Patient was prepped and draped in the usual sterile fashion.    Trigger Point Inj  Date/Time: 01/19/2021 7:26 AM Performed by: Cammy Copa, MD Authorized by: Cammy Copa, MD   Consent Given by:  Patient Site marked: the procedure site was marked   Timeout: prior to procedure the correct patient, procedure, and site was verified   Indications:  Pain Total # of Trigger Points:  1 Location: hip   Needle Size:  22 G Medications #1:  20 mg methylPREDNISolone acetate 40 MG/ML; 5 mL bupivacaine 0.5 %Patient is doing well from his right total knee replacement.  Left knee is bothering him as well and has severe arthritis.  His decision point today was timing of his left knee replacement.  He decided to proceed with injection and will likely delay his knee replacement several months to allow him to get back to work for several months.  Injection today performed and we will see him back potentially at the beginning of January for reevaluation for left total knee  replacement  Patient also reports left iliac crest pain radiating down with numbness in the lateral femoral cutaneous nerve distribution.  Under ultrasound guidance we injected that area to see if that could help with the anesthesia part of it.  We will see how he does with that injection.  Does not look like this is coming from his back.

## 2021-01-19 DIAGNOSIS — G5712 Meralgia paresthetica, left lower limb: Secondary | ICD-10-CM

## 2021-01-19 MED ORDER — BUPIVACAINE HCL 0.5 % IJ SOLN
5.0000 mL | INTRAMUSCULAR | Status: AC | PRN
  Administered 2021-01-19: 5 mL

## 2021-01-19 MED ORDER — LIDOCAINE HCL 1 % IJ SOLN
5.0000 mL | INTRAMUSCULAR | Status: AC | PRN
  Administered 2021-01-14: 5 mL

## 2021-01-19 MED ORDER — METHYLPREDNISOLONE ACETATE 40 MG/ML IJ SUSP
40.0000 mg | INTRAMUSCULAR | Status: AC | PRN
  Administered 2021-01-14: 40 mg via INTRA_ARTICULAR

## 2021-01-19 MED ORDER — BUPIVACAINE HCL 0.25 % IJ SOLN
4.0000 mL | INTRAMUSCULAR | Status: AC | PRN
  Administered 2021-01-14: 4 mL via INTRA_ARTICULAR

## 2021-01-19 MED ORDER — METHYLPREDNISOLONE ACETATE 40 MG/ML IJ SUSP
20.0000 mg | INTRAMUSCULAR | Status: AC | PRN
  Administered 2021-01-19: 20 mg via INTRAMUSCULAR

## 2021-01-22 ENCOUNTER — Other Ambulatory Visit: Payer: Self-pay | Admitting: Surgical

## 2021-01-24 ENCOUNTER — Telehealth: Payer: Self-pay | Admitting: Orthopedic Surgery

## 2021-01-24 NOTE — Telephone Encounter (Signed)
I have messaged Ernest Navarro asking him to please help with this.

## 2021-01-24 NOTE — Telephone Encounter (Signed)
Pt states that's the ice man therapy pad has broken and pt needs another.Please call him asap   CB [phone]

## 2021-01-24 NOTE — Telephone Encounter (Signed)
Spoke with [employer]. He will call patient.

## 2021-01-28 ENCOUNTER — Telehealth: Payer: Self-pay | Admitting: Orthopedic Surgery

## 2021-01-28 NOTE — Telephone Encounter (Signed)
Patient called. He would like Lauren to call him. [phone]

## 2021-01-31 ENCOUNTER — Telehealth: Payer: Self-pay | Admitting: Orthopedic Surgery

## 2021-01-31 ENCOUNTER — Other Ambulatory Visit: Payer: Self-pay | Admitting: Surgical

## 2021-01-31 MED ORDER — TRAMADOL HCL 50 MG PO TABS: 50.0000 mg | ORAL_TABLET | Freq: Two times a day (BID) | ORAL | 0 refills | Status: DC | PRN

## 2021-01-31 NOTE — Telephone Encounter (Signed)
Patient called needing Rx refilled Tramadol. The number to contact patient is [phone]

## 2021-02-01 NOTE — Telephone Encounter (Signed)
Sent in yesterday.

## 2021-02-01 NOTE — Telephone Encounter (Signed)
IC

## 2021-04-06 ENCOUNTER — Ambulatory Visit: Payer: BC Managed Care – PPO | Admitting: Neurology

## 2021-04-08 ENCOUNTER — Ambulatory Visit: Payer: BC Managed Care – PPO | Admitting: Orthopedic Surgery

## 2021-04-08 ENCOUNTER — Other Ambulatory Visit: Payer: Self-pay

## 2021-04-08 DIAGNOSIS — M1712 Unilateral primary osteoarthritis, left knee: Secondary | ICD-10-CM

## 2021-04-08 DIAGNOSIS — R635 Abnormal weight gain: Secondary | ICD-10-CM

## 2021-04-08 DIAGNOSIS — R5383 Other fatigue: Secondary | ICD-10-CM | POA: Diagnosis not present

## 2021-04-09 LAB — THYROID PANEL WITH TSH
Free Thyroxine Index: 2.3 (ref 1.4–3.8)
T3 Uptake: 29 % (ref 22–35)
T4, Total: 7.8 ug/dL (ref 4.9–10.5)
TSH: 0.57 mIU/L (ref 0.40–4.50)

## 2021-04-09 NOTE — Progress Notes (Signed)
Thyroid ok pls clala thx

## 2021-04-11 NOTE — Progress Notes (Signed)
IC advised.  

## 2021-04-12 ENCOUNTER — Encounter: Payer: Self-pay | Admitting: Orthopedic Surgery

## 2021-04-12 MED ORDER — BUPIVACAINE HCL 0.25 % IJ SOLN
4.0000 mL | INTRAMUSCULAR | Status: AC | PRN
Start: 2021-04-08 — End: 2021-04-08
  Administered 2021-04-08: 4 mL via INTRA_ARTICULAR

## 2021-04-12 MED ORDER — METHYLPREDNISOLONE ACETATE 40 MG/ML IJ SUSP
40.0000 mg | INTRAMUSCULAR | Status: AC | PRN
  Administered 2021-04-08: 40 mg via INTRA_ARTICULAR

## 2021-04-12 MED ORDER — LIDOCAINE HCL 1 % IJ SOLN
5.0000 mL | INTRAMUSCULAR | Status: AC | PRN
  Administered 2021-04-08: 5 mL

## 2021-04-12 NOTE — Progress Notes (Signed)
Office Visit Note   Patient: Ernest Navarro.           Date of Birth: 1960/10/11           MRN: [id number] Visit Date: 04/08/2021 Requested by: No referring provider defined for this encounter. PCP: Patient, No Pcp Per (Inactive)  Subjective: Chief Complaint  Patient presents with   Right Knee - Follow-up   Left Knee - Follow-up    HPI: Ernest Navarro is a 61 year old patient who underwent right total knee replacement 10/20/2020.  He states his right knee is doing really well.  Had left knee injection 01/14/2022.  Would like to get the left knee replaced at some time in the near future.  He is doing physical therapy on his own.  Anterior superior iliac crest shot also helped his meralgia paresthetica on the left-hand side.  He has a big golf trip coming up in March and would like to get an injection and will consider getting his other knee replaced shortly thereafter.  But not within 3 months of this injection.              ROS: All systems reviewed are negative as they relate to the chief complaint within the history of present illness.  Patient denies  fevers or chills.   Assessment & Plan: Visit Diagnoses:  1. Weight gain, abnormal   2. Lethargy     Plan: Impression is right knee doing well following knee replacement.  He is having some weight gain issues and fatigue.  Has not had thyroid panel drawn in a while.  That is drawn today and it was normal.  We will also aspirate and inject the left knee today per patient request.  Handicap sticker for 6 months.  We will aim potentially for surgery sometime in April.  Follow-up with [place] as needed.  Follow-Up Instructions: No follow-ups on file.   Orders:  Orders Placed This Encounter  Procedures   Thyroid Panel With TSH   No orders of the defined types were placed in this encounter.     Procedures: Large Joint Inj: L knee on 04/08/2021 12:19 PM Indications: diagnostic evaluation, joint swelling and pain Details: 18 G 1.5 in needle,  superolateral approach  Arthrogram: No  Medications: 5 mL lidocaine 1 %; 40 mg methylPREDNISolone acetate 40 MG/ML; 4 mL bupivacaine 0.25 % Outcome: tolerated well, no immediate complications Procedure, treatment alternatives, risks and benefits explained, specific risks discussed. Consent was given by the patient. Immediately prior to procedure a time out was called to verify the correct patient, procedure, equipment, support staff and site/side marked as required. Patient was prepped and draped in the usual sterile fashion.      Clinical Data: No additional findings.  Objective: Vital Signs: There were no vitals taken for this visit.  Physical Exam:   Constitutional: Patient appears well-developed HEENT:  Head: Normocephalic Eyes:EOM are normal Neck: Normal range of motion Cardiovascular: Normal rate Pulmonary/chest: Effort normal Neurologic: Patient is alert Skin: Skin is warm Psychiatric: Patient has normal mood and affect   Ortho Exam: Ortho exam demonstrates good range of motion and strength at the right knee.  Left knee has mild effusion.  Collateral cruciate ligaments are stable on the left.  Pedal pulses palpable.  Range of motion is 5-1 15 on the left.  Specialty Comments:  10/25/20 - wants contralateral knee aspirated at time of surgery, understands no injection is possible  Imaging: No results found.  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   Obesity 06/29/2015   Obstructive sleep apnea 05/06/2015   Seasonal and perennial allergic rhinitis 05/06/2015   Hyperglycemia 06/19/2012   Essential hypertension, benign 06/19/2012   Gout 06/19/2012   Nocturia 06/19/2012   Past Medical History:  Diagnosis Date   Arthritis    bilateral knees   Essential hypertension, benign 06/19/2012   Gout 06/19/2012   Headache    Hyperglycemia 06/19/2012    March 2014 A1c 6.5 (repeat needed)    Pneumonia    PONV (postoperative nausea and vomiting)    Pre-diabetes    Sleep apnea     Family History  Problem Relation Age of Onset   Colon cancer Neg Hx     Past Surgical History:  Procedure Laterality Date   KNEE SURGERY Right    x3 arthroscopies   TOTAL KNEE ARTHROPLASTY Right 10/28/2020   Procedure: RIGHT TOTAL KNEE ARTHROPLASTY;  Surgeon: Meredith Pel, MD;  Location: [facility];  Service: Orthopedics;  Laterality: Right;   Social History   Occupational History   Not on file  Tobacco Use   Smoking status: Never   Smokeless tobacco: Never  Vaping Use   Vaping Use: Never used  Substance and Sexual Activity   Alcohol use: No    Alcohol/week: 0.0 standard drinks   Drug use: No   Sexual activity: Not on file

## 2021-05-28 ENCOUNTER — Other Ambulatory Visit: Payer: Self-pay | Admitting: Surgical

## 2021-05-28 MED ORDER — TRAMADOL HCL 50 MG PO TABS: 50.0000 mg | ORAL_TABLET | Freq: Every day | ORAL | 0 refills | Status: DC | PRN

## 2021-07-12 ENCOUNTER — Telehealth: Payer: Self-pay

## 2021-07-12 ENCOUNTER — Encounter: Payer: Self-pay | Admitting: Gastroenterology

## 2021-07-12 NOTE — Telephone Encounter (Signed)
Sounds good.  I would use Stryker on the left knee.  No recent x-rays needed.  We will try to avoid cutting the quad tendon on this knee as well.  Okay for general also.  He should be able to do straight leg raise on postop day #1.

## 2021-07-12 NOTE — Telephone Encounter (Signed)
-----   Message from Alfonzo Beers sent at 07/12/2021  9:56 AM EDT ----- ?Regarding: surgery sheet ? ? ?Mr. Gerding is ready to have his left total knee arthoplasty.   I have scheduled him for  09-05-21 @ 12:15pm at [facility].  ? ? ?Patient's right total knee was done 10-28-20 and STRYKER was used.  ?WILL STRYKER BE USED FOR THIS LEFT KNEE?  ? ? ?Xrays for left knee where done 12-24-19.   ?DO YOU WANT PATIENT TO HAVE MORE RECENT X-RAYS DONE?  ? ? ?Patient is asking if there is anyway to AVOID cutting into the QUAD with this LEFT KNEE.  ?PLEASE ADVISE ? ? ?Also patient is requesting GENERAL anesthesia (does NOT want spinal)  ?OK TO SCHEDULED GENERAL? ? ? ? ? ? ? ? ? ?

## 2021-07-12 NOTE — Telephone Encounter (Signed)
Please see note from Debbie. Please advise.  ?

## 2021-07-18 ENCOUNTER — Telehealth: Payer: Self-pay | Admitting: Orthopedic Surgery

## 2021-07-18 NOTE — Telephone Encounter (Signed)
I think he is okay to be excused from jury duty if this is what we are talking about because that involves prolonged sitting which he may not be able to do with his knee that has not been operated on in its current condition.

## 2021-07-18 NOTE — Telephone Encounter (Signed)
Please see message below. Patient has had previous R TKA preformed on 10/28/2020 and he is scheduled for a L TKA on 09/05/2021 ?

## 2021-07-18 NOTE — Telephone Encounter (Signed)
Pt called requesting a letter to be sent to his mychart as soon as possible. Pt has court at 1 pm and need a letter to excuse him from court. Pt had knee replacement surgery. Please call pt when letter has been sent to his mychart. Pt is asking if letter can be expedited. Pt phone number is [phone]. ?

## 2021-07-18 NOTE — Telephone Encounter (Signed)
Pt called to cancel last request for note for court. Pt states he found his note. No call back needed ?

## 2021-07-19 NOTE — Telephone Encounter (Signed)
Letter excusing from jury duty has been created. Patient would like to know if he needs to come into the office for a appointment prior to his scheduled knee replacement on 09-05-2021. ?

## 2021-07-20 NOTE — Telephone Encounter (Signed)
Need height and weight for BMI calculation because that is 1 thing that may prevent him from getting surgery especially if he has had some weight gain.  Can you call him and find out what his height and weight is and calculate BMI and then if that is less than 40 then he should be good to go for surgery in June without follow-up appointment.

## 2021-07-21 ENCOUNTER — Telehealth: Payer: Self-pay

## 2021-07-21 NOTE — Telephone Encounter (Signed)
Ok thx no preop appt needed

## 2021-07-21 NOTE — Telephone Encounter (Signed)
Height and weight per patient as requested ?

## 2021-07-21 NOTE — Telephone Encounter (Signed)
Attempted to contact patient however had to leave a voicemail. Informed patient that we need a accurate height and weight to ensure that his BMI is under 40  ?

## 2021-07-21 NOTE — Telephone Encounter (Signed)
Contacted patient and made him aware 

## 2021-07-21 NOTE — Telephone Encounter (Signed)
According to patient he is 8ft and 284. Patient is under 40 (BMI)  ?

## 2021-07-27 ENCOUNTER — Other Ambulatory Visit: Payer: Self-pay | Admitting: Surgical

## 2021-07-27 MED ORDER — TRAMADOL HCL 50 MG PO TABS: 50.0000 mg | ORAL_TABLET | Freq: Every day | ORAL | 0 refills | Status: DC | PRN

## 2021-08-01 ENCOUNTER — Telehealth: Payer: Self-pay | Admitting: Orthopedic Surgery

## 2021-08-01 ENCOUNTER — Other Ambulatory Visit: Payer: Self-pay | Admitting: Surgical

## 2021-08-01 MED ORDER — METHOCARBAMOL 500 MG PO TABS
500.0000 mg | ORAL_TABLET | Freq: Three times a day (TID) | ORAL | 0 refills | Status: DC | PRN
Start: 2021-08-01 — End: 2022-02-27

## 2021-08-01 NOTE — Telephone Encounter (Signed)
Patient is requesting a muscle relaxer he was prescribed after a previous surgery as well as a refill of the Tramadol he was prescribed per [facility]. He feels he has had better results taking a muscle relaxer but would like the Tramadol as a back up. ?

## 2021-08-01 NOTE — Telephone Encounter (Signed)
Sent in RX

## 2021-08-01 NOTE — Telephone Encounter (Signed)
IC advised submitted.  

## 2021-08-03 ENCOUNTER — Other Ambulatory Visit: Payer: Self-pay

## 2021-08-16 ENCOUNTER — Ambulatory Visit: Payer: BC Managed Care – PPO | Admitting: Gastroenterology

## 2021-08-26 NOTE — Pre-Procedure Instructions (Signed)
Surgical Instructions    Your procedure is scheduled on Monday 09/05/21.   Report to [facility] Main Entrance "A" at 10:15 A.M., then check in with the Admitting office.  Call this number if you have problems the morning of surgery:  [phone]   If you have any questions prior to your surgery date call [phone]: Open Monday-Friday 8am-4pm    Remember:  Do not eat after midnight the night before your surgery  You may drink clear liquids until 09:15 A.M. the morning of your surgery.   Clear liquids allowed are: Water, Non-Citrus Juices (without pulp), Carbonated Beverages, Clear Tea, Black Coffee ONLY (NO MILK, CREAM OR POWDERED CREAMER of any kind), and Gatorade    Take these medicines the morning of surgery with A SIP OF WATER:   amLODipine (NORVASC)    Take these medicines if needed:   acetaminophen (TYLENOL)   methocarbamol (ROBAXIN)  traMADol (ULTRAM)   As of today, STOP taking any Aspirin (unless otherwise instructed by your surgeon) Aleve, Naproxen, Ibuprofen, Motrin, Advil, Goody's, BC's, all herbal medications, fish oil, and all vitamins.           Do not wear jewelry  Do not wear lotions, powders, colognes, or deodorant. Do not shave 48 hours prior to surgery.  Men may shave face and neck. Do not bring valuables to the hospital. Do not wear nail polish, gel polish, artificial nails, or any other type of covering on natural nails (fingers and toes) If you have artificial nails or gel coating that need to be removed by a nail salon, please have this removed prior to surgery. Artificial nails or gel coating may interfere with anesthesia's ability to adequately monitor your vital signs.  [facility] is not responsible for any belongings or valuables. .   Do NOT Smoke (Tobacco/Vaping)  24 hours prior to your procedure  If you use a CPAP at night, you may bring your mask for your overnight stay.   Contacts, glasses, hearing aids, dentures or partials may not be worn  into surgery, please bring cases for these belongings   For patients admitted to the hospital, discharge time will be determined by your treatment team.   Patients discharged the day of surgery will not be allowed to drive home, and someone needs to stay with them for 24 hours.   SURGICAL WAITING ROOM VISITATION Patients having surgery or a procedure in a hospital may have two support people. Children under the age of 72 must have an adult with them who is not the patient. They may stay in the waiting area during the procedure and may switch out with other visitors. If the patient needs to stay at the hospital during part of their recovery, the visitor guidelines for inpatient rooms apply.  Please refer to the [facility] website for the visitor guidelines for Inpatients (after your surgery is over and you are in a regular room).       Special instructions:    Oral Hygiene is also important to reduce your risk of infection.  Remember - BRUSH YOUR TEETH THE MORNING OF SURGERY WITH YOUR REGULAR TOOTHPASTE   - Preparing For Surgery  Before surgery, you can play an important role. Because skin is not sterile, your skin needs to be as free of germs as possible. You can reduce the number of germs on your skin by washing with CHG (chlorahexidine gluconate) Soap before surgery.  CHG is an antiseptic cleaner which kills germs and bonds with the  skin to continue killing germs even after washing.     Please do not use if you have an allergy to CHG or antibacterial soaps. If your skin becomes reddened/irritated stop using the CHG.  Do not shave (including legs and underarms) for at least 48 hours prior to first CHG shower. It is OK to shave your face.  Please follow these instructions carefully.     Shower the NIGHT BEFORE SURGERY and the MORNING OF SURGERY with CHG Soap.   If you chose to wash your hair, wash your hair first as usual with your normal shampoo. After you shampoo,  rinse your hair and body thoroughly to remove the shampoo.  Then [employer] and genitals (private parts) with your normal soap and rinse thoroughly to remove soap.  After that Use CHG Soap as you would any other liquid soap. You can apply CHG directly to the skin and wash gently with a scrungie or a clean washcloth.   Apply the CHG Soap to your body ONLY FROM THE NECK DOWN.  Do not use on open wounds or open sores. Avoid contact with your eyes, ears, mouth and genitals (private parts). Wash Face and genitals (private parts)  with your normal soap.   Wash thoroughly, paying special attention to the area where your surgery will be performed.  Thoroughly rinse your body with warm water from the neck down.  DO NOT shower/wash with your normal soap after using and rinsing off the CHG Soap.  Pat yourself dry with a CLEAN TOWEL.  Wear CLEAN PAJAMAS to bed the night before surgery  Place CLEAN SHEETS on your bed the night before your surgery  DO NOT SLEEP WITH PETS.   Day of Surgery:  Take a shower with CHG soap. Wear Clean/Comfortable clothing the morning of surgery Do not apply any deodorants/lotions.   Remember to brush your teeth WITH YOUR REGULAR TOOTHPASTE.    If you received a COVID test during your pre-op visit, it is requested that you wear a mask when out in public, stay away from anyone that may not be feeling well, and notify your surgeon if you develop symptoms. If you have been in contact with anyone that has tested positive in the last 10 days, please notify your surgeon.    Please read over the following fact sheets that you were given.

## 2021-08-30 ENCOUNTER — Other Ambulatory Visit: Payer: Self-pay

## 2021-08-30 ENCOUNTER — Encounter (HOSPITAL_COMMUNITY): Payer: Self-pay

## 2021-08-30 ENCOUNTER — Encounter (HOSPITAL_COMMUNITY)
Admission: RE | Admit: 2021-08-30 | Discharge: 2021-08-30 | Disposition: A | Discharge status: Home / Self Care | Payer: BC Managed Care – PPO | Source: Ambulatory Visit | Attending: Orthopedic Surgery | Admitting: Orthopedic Surgery

## 2021-08-30 VITALS — BP 157/110 | HR 105 | Temp 97.6°F | Resp 19 | Ht 72.0 in | Wt 310.1 lb

## 2021-08-30 DIAGNOSIS — Z01812 Encounter for preprocedural laboratory examination: Secondary | ICD-10-CM | POA: Diagnosis present

## 2021-08-30 DIAGNOSIS — E119 Type 2 diabetes mellitus without complications: Secondary | ICD-10-CM | POA: Insufficient documentation

## 2021-08-30 DIAGNOSIS — Z01818 Encounter for other preprocedural examination: Secondary | ICD-10-CM

## 2021-08-30 LAB — URINALYSIS, COMPLETE (UACMP) WITH MICROSCOPIC
Glucose, UA: NEGATIVE mg/dL
Hgb urine dipstick: NEGATIVE
Ketones, ur: NEGATIVE mg/dL
Leukocytes,Ua: NEGATIVE
Nitrite: NEGATIVE
Protein, ur: 30 mg/dL — AB
Specific Gravity, Urine: 1.029 (ref 1.005–1.030)
pH: 5 (ref 5.0–8.0)

## 2021-08-30 LAB — HEMOGLOBIN A1C
Hgb A1c MFr Bld: 7 % — ABNORMAL HIGH (ref 4.8–5.6)
Mean Plasma Glucose: 154.2 mg/dL

## 2021-08-30 LAB — BASIC METABOLIC PANEL
Anion gap: 9 (ref 5–15)
BUN: 9 mg/dL (ref 8–23)
CO2: 26 mmol/L (ref 22–32)
Calcium: 10.8 mg/dL — ABNORMAL HIGH (ref 8.9–10.3)
Chloride: 104 mmol/L (ref 98–111)
Creatinine, Ser: 1.03 mg/dL (ref 0.61–1.24)
GFR, Estimated: >60 mL/min (ref >60)
Glucose, Bld: 122 mg/dL — ABNORMAL HIGH (ref 70–99)
Potassium: 3.7 mmol/L (ref 3.5–5.1)
Sodium: 139 mmol/L (ref 135–145)

## 2021-08-30 LAB — SURGICAL PCR SCREEN
MRSA, PCR: NEGATIVE
Staphylococcus aureus: POSITIVE — AB

## 2021-08-30 LAB — CBC
HCT: 53.9 % — ABNORMAL HIGH (ref 39.0–52.0)
Hemoglobin: 16.7 g/dL (ref 13.0–17.0)
MCH: 24.9 pg — ABNORMAL LOW (ref 26.0–34.0)
MCHC: 31 g/dL (ref 30.0–36.0)
MCV: 80.3 fL (ref 80.0–100.0)
Platelets: 292 10*3/uL (ref 150–400)
RBC: 6.71 MIL/uL — ABNORMAL HIGH (ref 4.22–5.81)
RDW: 16.6 % — ABNORMAL HIGH (ref 11.5–15.5)
WBC: 7 10*3/uL (ref 4.0–10.5)
nRBC: 0 % (ref 0.0–0.2)

## 2021-08-30 NOTE — Progress Notes (Addendum)
PCP - Dr. Ralene Ok Cardiologist - denies  PPM/ICD - n/a  Chest x-ray - n/a EKG - 10/25/20 Stress Test - 20+ years ago ECHO - denies Cardiac Cath - denies  Sleep Study - OSA+ (from note from Dr. Jetty Duhamel in 2017) CPAP - denies use.  Pt does not check CBG at home. Pt does not take medication for DM. Pt claims that he is pre-diabetic. Will collect A1C today.  Blood Thinner Instructions: n/a Aspirin Instructions: n/a   ERAS Protcol -Clear liquids until 0915 DOS PRE-SURGERY Ensure or G2- (1) G2 provided.  COVID TEST- n/a  BP elevated in PAT appt. Starting pressure 156/114 and end of appt 157/110. Pt stated he took his BP meds at 1000 this morning. Pt advised to start checking BP at home, AM and PM. Pt is also asked to reach out to his PCP for further guidance prior to surgery.   Anesthesia review: Yes, elevated BP at PAT appt. Antionette Poles, PA-C made aware and will f/u with pt in a few days.  Patient denies shortness of breath, fever, cough and chest pain at PAT appointment   All instructions explained to the patient, with a verbal understanding of the material. Patient agrees to go over the instructions while at home for a better understanding. Patient also instructed to self quarantine after being tested for COVID-19. The opportunity to ask questions was provided.

## 2021-08-31 LAB — URINE CULTURE: Culture: NO GROWTH

## 2021-09-01 NOTE — Progress Notes (Signed)
Anesthesia Chart Review:  Blood pressure noted to be elevated preadmission testing appointment, 156/114 on arrival and 157/110 on recheck.  Patient reported compliance with amlodipine.  He denies any symptoms of headache, chest pain, shortness of breath.  He states he does have a blood pressure monitor at home but has not been using it recently.  He also states he has been taking more ibuprofen lately to help manage osteoarthritis pain.  He was advised to discontinue ibuprofen and monitor blood pressure at home over the next couple days.  If blood pressure remained similarly elevated, he was advised to reach out to his PCP Dr. Ludwig Clarks for further advice.  I called the patient on 09/01/2021 to follow-up.  He states that since stopping ibuprofen, his pressures have been coming down.  On last check today, he reported blood pressure 128/90.  He states out of an abundance of caution he already called his PCP Dr. Ludwig Clarks and has appointment to be seen 09/02/21 for follow-up prior to surgery.  Given improvement in blood pressure, I do not anticipate any issue with proceeding with surgery.  He previously underwent right TKA July 2022 without complication.   OSA, not on CPAP.  Diet controlled DM2, A1c 7.0 on preop labs.  Preop labs reviewed, unremarkable.  EKG 10/25/2020: NSR.  Rate 81.    Zannie Cove [facility] Short Stay Center/Anesthesiology Phone [phone] 09/01/2021 2:16 PM

## 2021-09-01 NOTE — Anesthesia Preprocedure Evaluation (Addendum)
Anesthesia Evaluation  Patient identified by MRN, date of birth, ID band Patient awake    Reviewed: Allergy & Precautions, NPO status , Patient's Chart, lab work & pertinent test results  Airway Mallampati: II  TM Distance: >3 FB Neck ROM: Full    Dental  (+) Teeth Intact, Dental Advisory Given, Implants,    Pulmonary sleep apnea ,    Pulmonary exam normal breath sounds clear to auscultation       Cardiovascular hypertension, Pt. on medications Normal cardiovascular exam Rhythm:Regular Rate:Normal     Neuro/Psych  Headaches,    GI/Hepatic negative GI ROS, Neg liver ROS,   Endo/Other  Morbid obesity  Renal/GU negative Renal ROS     Musculoskeletal  (+) Arthritis ,   Abdominal   Peds  Hematology negative hematology ROS (+)   Anesthesia Other Findings Day of surgery medications reviewed with the patient.  Reproductive/Obstetrics                            Anesthesia Physical Anesthesia Plan  ASA: 3  Anesthesia Plan: General   Post-op Pain Management: Regional block*   Induction: Intravenous  PONV Risk Score and Plan: 2 and Midazolam, Dexamethasone and Ondansetron  Airway Management Planned: Oral ETT  Additional Equipment:   Intra-op Plan:   Post-operative Plan: Extubation in OR  Informed Consent: I have reviewed the patients History and Physical, chart, labs and discussed the procedure including the risks, benefits and alternatives for the proposed anesthesia with the patient or authorized representative who has indicated his/her understanding and acceptance.     Dental advisory given  Plan Discussed with: CRNA  Anesthesia Plan Comments: (PAT note by Karoline Caldwell, PA-C:"  Blood pressure noted to be elevated preadmission testing appointment, 156/114 on arrival and 157/110 on recheck.  Patient reported compliance with amlodipine.  He denies any symptoms of headache, chest  pain, shortness of breath.  He states he does have a blood pressure monitor at home but has not been using it recently.  He also states he has been taking more ibuprofen lately to help manage osteoarthritis pain.  He was advised to discontinue ibuprofen and monitor blood pressure at home over the next couple days.  If blood pressure remained similarly elevated, he was advised to reach out to his PCP Dr. Mellody Drown for further advice.  I called the patient on 09/01/2021 to follow-up.  He states that since stopping ibuprofen, his pressures have been coming down.  On last check today, he reported blood pressure 128/90.  He states out of an abundance of caution he already called his PCP Dr. Mellody Drown and has appointment to be seen 09/02/21 for follow-up prior to surgery.  Given improvement in blood pressure, I do not anticipate any issue with proceeding with surgery.  He previously underwent right TKA July [id number] without complication.   OSA, not on CPAP.  Diet controlled DM2, A1c 7.0 on preop labs.  Preop labs reviewed, unremarkable.  EKG 10/25/2020: NSR.  Rate 81.  )       Anesthesia Quick Evaluation

## 2021-09-02 MED ORDER — TRANEXAMIC ACID 1000 MG/10ML IV SOLN
2000.0000 mg | INTRAVENOUS | Status: DC
  Filled 2021-09-02: qty 20

## 2021-09-05 ENCOUNTER — Encounter (HOSPITAL_COMMUNITY): Payer: Self-pay | Admitting: Orthopedic Surgery

## 2021-09-05 ENCOUNTER — Ambulatory Visit (HOSPITAL_COMMUNITY): Payer: BC Managed Care – PPO | Admitting: Anesthesiology

## 2021-09-05 ENCOUNTER — Ambulatory Visit (HOSPITAL_COMMUNITY): Payer: BC Managed Care – PPO | Admitting: Physician Assistant

## 2021-09-05 ENCOUNTER — Observation Stay (HOSPITAL_COMMUNITY)
Admission: RE | Admit: 2021-09-05 | Discharge: 2021-09-09 | Disposition: A | Discharge status: Skilled Nursing Facility | Payer: BC Managed Care – PPO | Source: Ambulatory Visit | Attending: Orthopedic Surgery | Admitting: Orthopedic Surgery

## 2021-09-05 ENCOUNTER — Encounter (HOSPITAL_COMMUNITY)
Admission: RE | Disposition: A | Discharge status: Skilled Nursing Facility | Payer: Self-pay | Source: Ambulatory Visit | Attending: Orthopedic Surgery

## 2021-09-05 DIAGNOSIS — Z96651 Presence of right artificial knee joint: Secondary | ICD-10-CM | POA: Diagnosis not present

## 2021-09-05 DIAGNOSIS — Z96652 Presence of left artificial knee joint: Secondary | ICD-10-CM

## 2021-09-05 DIAGNOSIS — Z01818 Encounter for other preprocedural examination: Secondary | ICD-10-CM

## 2021-09-05 DIAGNOSIS — R7303 Prediabetes: Secondary | ICD-10-CM | POA: Diagnosis not present

## 2021-09-05 DIAGNOSIS — M1712 Unilateral primary osteoarthritis, left knee: Secondary | ICD-10-CM | POA: Diagnosis present

## 2021-09-05 DIAGNOSIS — E119 Type 2 diabetes mellitus without complications: Secondary | ICD-10-CM

## 2021-09-05 DIAGNOSIS — I10 Essential (primary) hypertension: Secondary | ICD-10-CM | POA: Insufficient documentation

## 2021-09-05 HISTORY — PX: TOTAL KNEE ARTHROPLASTY: SHX125

## 2021-09-05 LAB — GLUCOSE, CAPILLARY
Glucose-Capillary: 160 mg/dL — ABNORMAL HIGH (ref 70–99)
Glucose-Capillary: 155 mg/dL — ABNORMAL HIGH (ref 70–99)

## 2021-09-05 SURGERY — ARTHROPLASTY, KNEE, TOTAL
Anesthesia: General | Site: Knee | Laterality: Left

## 2021-09-05 MED ORDER — PHENYLEPHRINE HCL (PRESSORS) 10 MG/ML IV SOLN
INTRAVENOUS | Status: AC
  Filled 2021-09-05: qty 1

## 2021-09-05 MED ORDER — FENTANYL CITRATE (PF) 100 MCG/2ML IJ SOLN
25.0000 ug | INTRAMUSCULAR | Status: DC | PRN
  Administered 2021-09-05: 50 ug via INTRAVENOUS
  Administered 2021-09-05 (×4): 25 ug via INTRAVENOUS

## 2021-09-05 MED ORDER — ONDANSETRON HCL 4 MG/2ML IJ SOLN
INTRAMUSCULAR | Status: DC | PRN
  Administered 2021-09-05: 4 mg via INTRAVENOUS

## 2021-09-05 MED ORDER — CLONIDINE HCL (ANALGESIA) 100 MCG/ML EP SOLN
EPIDURAL | Status: DC | PRN
  Administered 2021-09-05: 13 mL

## 2021-09-05 MED ORDER — VANCOMYCIN HCL 1000 MG IV SOLR
INTRAVENOUS | Status: AC
  Filled 2021-09-05: qty 20

## 2021-09-05 MED ORDER — FENTANYL CITRATE (PF) 250 MCG/5ML IJ SOLN
INTRAMUSCULAR | Status: DC | PRN
  Administered 2021-09-05 (×2): 50 ug via INTRAVENOUS
  Administered 2021-09-05: 100 ug via INTRAVENOUS

## 2021-09-05 MED ORDER — ASPIRIN 81 MG PO CHEW: 81.0000 mg | CHEWABLE_TABLET | Freq: Two times a day (BID) | ORAL | Status: DC

## 2021-09-05 MED ORDER — SUGAMMADEX SODIUM 200 MG/2ML IV SOLN
INTRAVENOUS | Status: DC | PRN
  Administered 2021-09-05: 400 mg via INTRAVENOUS

## 2021-09-05 MED ORDER — ACETAMINOPHEN 500 MG PO TABS
1000.0000 mg | ORAL_TABLET | Freq: Four times a day (QID) | ORAL | Status: AC
  Administered 2021-09-05 – 2021-09-06 (×4): 1000 mg via ORAL
  Filled 2021-09-05 (×4): qty 2

## 2021-09-05 MED ORDER — HYDROMORPHONE HCL 2 MG PO TABS: 1.0000 mg | ORAL_TABLET | ORAL | Status: DC | PRN

## 2021-09-05 MED ORDER — MIDAZOLAM HCL 2 MG/2ML IJ SOLN
INTRAMUSCULAR | Status: AC
  Filled 2021-09-05: qty 2

## 2021-09-05 MED ORDER — CLONIDINE HCL (ANALGESIA) 100 MCG/ML EP SOLN
EPIDURAL | Status: DC | PRN
  Administered 2021-09-05: 100 ug

## 2021-09-05 MED ORDER — CEFAZOLIN SODIUM-DEXTROSE 2-4 GM/100ML-% IV SOLN
INTRAVENOUS | Status: AC
  Filled 2021-09-05: qty 100

## 2021-09-05 MED ORDER — FENTANYL CITRATE (PF) 250 MCG/5ML IJ SOLN
INTRAMUSCULAR | Status: AC
  Filled 2021-09-05: qty 5

## 2021-09-05 MED ORDER — MIDAZOLAM HCL 2 MG/2ML IJ SOLN
2.0000 mg | Freq: Once | INTRAMUSCULAR | Status: AC
  Administered 2021-09-05: 2 mg via INTRAVENOUS

## 2021-09-05 MED ORDER — SODIUM CHLORIDE 0.9 % IR SOLN
Status: DC | PRN
  Administered 2021-09-05: 3000 mL

## 2021-09-05 MED ORDER — ONDANSETRON HCL 4 MG/2ML IJ SOLN: 4.0000 mg | Freq: Once | INTRAMUSCULAR | Status: DC | PRN

## 2021-09-05 MED ORDER — ACETAMINOPHEN 500 MG PO TABS
ORAL_TABLET | ORAL | Status: AC
  Filled 2021-09-05: qty 2

## 2021-09-05 MED ORDER — IRRISEPT - 450ML BOTTLE WITH 0.05% CHG IN STERILE WATER, USP 99.95% OPTIME
TOPICAL | Status: DC | PRN
  Administered 2021-09-05: 450 mL

## 2021-09-05 MED ORDER — SODIUM CHLORIDE 0.9 % IV SOLN: INTRAVENOUS | Status: AC

## 2021-09-05 MED ORDER — METHOCARBAMOL 1000 MG/10ML IJ SOLN: 500.0000 mg | Freq: Four times a day (QID) | INTRAVENOUS | Status: DC | PRN

## 2021-09-05 MED ORDER — HYDROMORPHONE HCL 1 MG/ML IJ SOLN: 0.5000 mg | INTRAMUSCULAR | Status: DC | PRN

## 2021-09-05 MED ORDER — DOCUSATE SODIUM 100 MG PO CAPS
100.0000 mg | ORAL_CAPSULE | Freq: Two times a day (BID) | ORAL | Status: DC
  Administered 2021-09-05 – 2021-09-09 (×8): 100 mg via ORAL
  Filled 2021-09-05 (×8): qty 1

## 2021-09-05 MED ORDER — METOCLOPRAMIDE HCL 5 MG/ML IJ SOLN: 5.0000 mg | Freq: Three times a day (TID) | INTRAMUSCULAR | Status: DC | PRN

## 2021-09-05 MED ORDER — BUPIVACAINE LIPOSOME 1.3 % IJ SUSP
INTRAMUSCULAR | Status: AC
  Filled 2021-09-05: qty 20

## 2021-09-05 MED ORDER — HYDROMORPHONE HCL 2 MG PO TABS
1.0000 mg | ORAL_TABLET | ORAL | Status: DC | PRN
  Administered 2021-09-05 – 2021-09-06 (×4): 2 mg via ORAL
  Filled 2021-09-05 (×4): qty 1

## 2021-09-05 MED ORDER — PHENYLEPHRINE 80 MCG/ML (10ML) SYRINGE FOR IV PUSH (FOR BLOOD PRESSURE SUPPORT)
PREFILLED_SYRINGE | INTRAVENOUS | Status: DC | PRN
  Administered 2021-09-05 (×2): 160 ug via INTRAVENOUS

## 2021-09-05 MED ORDER — CHLORHEXIDINE GLUCONATE 0.12 % MT SOLN
15.0000 mL | Freq: Once | OROMUCOSAL | Status: AC
  Administered 2021-09-05: 15 mL via OROMUCOSAL

## 2021-09-05 MED ORDER — PHENOL 1.4 % MT LIQD: 1.0000 | OROMUCOSAL | Status: DC | PRN

## 2021-09-05 MED ORDER — METHOCARBAMOL 500 MG PO TABS
500.0000 mg | ORAL_TABLET | Freq: Four times a day (QID) | ORAL | Status: DC | PRN
  Administered 2021-09-06 – 2021-09-08 (×5): 500 mg via ORAL
  Filled 2021-09-05 (×5): qty 1

## 2021-09-05 MED ORDER — DOCUSATE SODIUM 100 MG PO CAPS: 100.0000 mg | ORAL_CAPSULE | Freq: Two times a day (BID) | ORAL | Status: DC

## 2021-09-05 MED ORDER — LACTATED RINGERS IV SOLN: INTRAVENOUS | Status: DC

## 2021-09-05 MED ORDER — VANCOMYCIN HCL 1000 MG IV SOLR
INTRAVENOUS | Status: DC | PRN
  Administered 2021-09-05: 1000 mg via TOPICAL

## 2021-09-05 MED ORDER — ROPIVACAINE HCL 5 MG/ML IJ SOLN
INTRAMUSCULAR | Status: DC | PRN
  Administered 2021-09-05: 30 mL via PERINEURAL

## 2021-09-05 MED ORDER — CHLORHEXIDINE GLUCONATE 0.12 % MT SOLN
OROMUCOSAL | Status: AC
  Filled 2021-09-05: qty 15

## 2021-09-05 MED ORDER — TRANEXAMIC ACID 1000 MG/10ML IV SOLN
INTRAVENOUS | Status: DC | PRN
  Administered 2021-09-05: 2000 mg via TOPICAL

## 2021-09-05 MED ORDER — MENTHOL 3 MG MT LOZG
1.0000 | LOZENGE | OROMUCOSAL | Status: DC | PRN
Start: 2021-09-05 — End: 2021-09-09

## 2021-09-05 MED ORDER — FENTANYL CITRATE (PF) 100 MCG/2ML IJ SOLN
INTRAMUSCULAR | Status: AC
  Filled 2021-09-05: qty 2

## 2021-09-05 MED ORDER — ONDANSETRON HCL 4 MG PO TABS: 4.0000 mg | ORAL_TABLET | Freq: Four times a day (QID) | ORAL | Status: DC | PRN

## 2021-09-05 MED ORDER — POVIDONE-IODINE 10 % EX SWAB
2.0000 "application " | Freq: Once | CUTANEOUS | Status: AC
  Administered 2021-09-05: 2 via TOPICAL

## 2021-09-05 MED ORDER — ACETAMINOPHEN 325 MG PO TABS: 325.0000 mg | ORAL_TABLET | Freq: Four times a day (QID) | ORAL | Status: DC | PRN

## 2021-09-05 MED ORDER — POVIDONE-IODINE 7.5 % EX SOLN: Freq: Once | CUTANEOUS | Status: DC

## 2021-09-05 MED ORDER — ACETAMINOPHEN 500 MG PO TABS: 1000.0000 mg | ORAL_TABLET | Freq: Four times a day (QID) | ORAL | Status: DC

## 2021-09-05 MED ORDER — TRANEXAMIC ACID-NACL 1000-0.7 MG/100ML-% IV SOLN
INTRAVENOUS | Status: AC
  Filled 2021-09-05: qty 100

## 2021-09-05 MED ORDER — METHOCARBAMOL 1000 MG/10ML IJ SOLN
500.0000 mg | Freq: Four times a day (QID) | INTRAVENOUS | Status: DC | PRN
  Filled 2021-09-05: qty 5

## 2021-09-05 MED ORDER — 0.9 % SODIUM CHLORIDE (POUR BTL) OPTIME
TOPICAL | Status: DC | PRN
  Administered 2021-09-05 (×3): 1000 mL

## 2021-09-05 MED ORDER — MENTHOL 3 MG MT LOZG: 1.0000 | LOZENGE | OROMUCOSAL | Status: DC | PRN

## 2021-09-05 MED ORDER — CEFAZOLIN SODIUM-DEXTROSE 2-4 GM/100ML-% IV SOLN: 2.0000 g | Freq: Three times a day (TID) | INTRAVENOUS | Status: DC

## 2021-09-05 MED ORDER — METOCLOPRAMIDE HCL 5 MG PO TABS: 5.0000 mg | ORAL_TABLET | Freq: Three times a day (TID) | ORAL | Status: DC | PRN

## 2021-09-05 MED ORDER — DEXAMETHASONE SODIUM PHOSPHATE 10 MG/ML IJ SOLN
INTRAMUSCULAR | Status: DC | PRN
  Administered 2021-09-05: 10 mg via INTRAVENOUS

## 2021-09-05 MED ORDER — PHENYLEPHRINE HCL-NACL 20-0.9 MG/250ML-% IV SOLN
INTRAVENOUS | Status: DC | PRN
  Administered 2021-09-05: 30 ug/min via INTRAVENOUS

## 2021-09-05 MED ORDER — ONDANSETRON HCL 4 MG/2ML IJ SOLN: 4.0000 mg | Freq: Four times a day (QID) | INTRAMUSCULAR | Status: DC | PRN

## 2021-09-05 MED ORDER — PROPOFOL 10 MG/ML IV BOLUS
INTRAVENOUS | Status: AC
Start: 2021-09-05 — End: ?
  Filled 2021-09-05: qty 20

## 2021-09-05 MED ORDER — ASPIRIN 81 MG PO CHEW
81.0000 mg | CHEWABLE_TABLET | Freq: Two times a day (BID) | ORAL | Status: DC
  Administered 2021-09-05 – 2021-09-09 (×8): 81 mg via ORAL
  Filled 2021-09-05 (×8): qty 1

## 2021-09-05 MED ORDER — FENTANYL CITRATE (PF) 100 MCG/2ML IJ SOLN
100.0000 ug | Freq: Once | INTRAMUSCULAR | Status: AC
  Administered 2021-09-05: 100 ug via INTRAVENOUS

## 2021-09-05 MED ORDER — CEFAZOLIN IN SODIUM CHLORIDE 3-0.9 GM/100ML-% IV SOLN
3.0000 g | INTRAVENOUS | Status: AC
  Administered 2021-09-05: 3 g via INTRAVENOUS

## 2021-09-05 MED ORDER — ONDANSETRON HCL 4 MG/2ML IJ SOLN
4.0000 mg | Freq: Four times a day (QID) | INTRAMUSCULAR | Status: DC | PRN
  Administered 2021-09-05: 4 mg via INTRAVENOUS
  Filled 2021-09-05: qty 2

## 2021-09-05 MED ORDER — ACETAMINOPHEN 500 MG PO TABS
1000.0000 mg | ORAL_TABLET | Freq: Once | ORAL | Status: AC
  Administered 2021-09-05: 1000 mg via ORAL

## 2021-09-05 MED ORDER — HYDROMORPHONE HCL 1 MG/ML IJ SOLN
0.5000 mg | INTRAMUSCULAR | Status: DC | PRN
  Administered 2021-09-05: 1 mg via INTRAVENOUS
  Filled 2021-09-05: qty 1

## 2021-09-05 MED ORDER — CLONIDINE HCL (ANALGESIA) 100 MCG/ML EP SOLN
EPIDURAL | Status: AC
  Filled 2021-09-05: qty 10

## 2021-09-05 MED ORDER — ROCURONIUM BROMIDE 10 MG/ML (PF) SYRINGE
PREFILLED_SYRINGE | INTRAVENOUS | Status: DC | PRN
  Administered 2021-09-05: 70 mg via INTRAVENOUS

## 2021-09-05 MED ORDER — ORAL CARE MOUTH RINSE: 15.0000 mL | Freq: Once | OROMUCOSAL | Status: AC

## 2021-09-05 MED ORDER — BUPIVACAINE HCL (PF) 0.25 % IJ SOLN
INTRAMUSCULAR | Status: AC
  Filled 2021-09-05: qty 30

## 2021-09-05 MED ORDER — MORPHINE SULFATE (PF) 4 MG/ML IV SOLN
INTRAVENOUS | Status: AC
  Filled 2021-09-05: qty 2

## 2021-09-05 MED ORDER — CELECOXIB 100 MG PO CAPS: 100.0000 mg | ORAL_CAPSULE | Freq: Two times a day (BID) | ORAL | Status: DC

## 2021-09-05 MED ORDER — SODIUM CHLORIDE (PF) 0.9 % IJ SOLN
INTRAMUSCULAR | Status: DC | PRN
  Administered 2021-09-05: 60 mL

## 2021-09-05 MED ORDER — CELECOXIB 100 MG PO CAPS
100.0000 mg | ORAL_CAPSULE | Freq: Two times a day (BID) | ORAL | Status: DC
  Administered 2021-09-05 – 2021-09-09 (×8): 100 mg via ORAL
  Filled 2021-09-05 (×9): qty 1

## 2021-09-05 MED ORDER — TRANEXAMIC ACID-NACL 1000-0.7 MG/100ML-% IV SOLN
1000.0000 mg | INTRAVENOUS | Status: AC
  Administered 2021-09-05: 1000 mg via INTRAVENOUS

## 2021-09-05 MED ORDER — ATORVASTATIN CALCIUM 10 MG PO TABS
20.0000 mg | ORAL_TABLET | Freq: Every morning | ORAL | Status: DC
  Administered 2021-09-06 – 2021-09-09 (×4): 20 mg via ORAL
  Filled 2021-09-05 (×4): qty 2

## 2021-09-05 MED ORDER — CEFAZOLIN SODIUM-DEXTROSE 2-4 GM/100ML-% IV SOLN
2.0000 g | Freq: Three times a day (TID) | INTRAVENOUS | Status: AC
  Administered 2021-09-05 (×2): 2 g via INTRAVENOUS
  Filled 2021-09-05 (×2): qty 100

## 2021-09-05 MED ORDER — METHOCARBAMOL 500 MG PO TABS: 500.0000 mg | ORAL_TABLET | Freq: Four times a day (QID) | ORAL | Status: DC | PRN

## 2021-09-05 MED ORDER — AMLODIPINE BESYLATE 10 MG PO TABS
10.0000 mg | ORAL_TABLET | Freq: Every day | ORAL | Status: DC
  Administered 2021-09-06 – 2021-09-09 (×4): 10 mg via ORAL
  Filled 2021-09-05 (×4): qty 1

## 2021-09-05 MED ORDER — PROPOFOL 10 MG/ML IV BOLUS
INTRAVENOUS | Status: DC | PRN
  Administered 2021-09-05: 200 mg via INTRAVENOUS

## 2021-09-05 MED ORDER — LIDOCAINE 2% (20 MG/ML) 5 ML SYRINGE
INTRAMUSCULAR | Status: DC | PRN
  Administered 2021-09-05: 100 mg via INTRAVENOUS

## 2021-09-05 SURGICAL SUPPLY — 77 items
BAG COUNTER SPONGE SURGICOUNT (BAG) ×2 IMPLANT
BAG DECANTER FOR FLEXI CONT (MISCELLANEOUS) ×2 IMPLANT
BANDAGE ESMARK 6X9 LF (GAUZE/BANDAGES/DRESSINGS) ×1 IMPLANT
BLADE SAG 18X100X1.27 (BLADE) ×2 IMPLANT
BLADE SAGITTAL (BLADE) ×1
BLADE SAW THK.89X75X18XSGTL (BLADE) ×1 IMPLANT
BNDG COHESIVE 6X5 TAN STRL LF (GAUZE/BANDAGES/DRESSINGS) ×2 IMPLANT
BNDG ELASTIC 6X15 VLCR STRL LF (GAUZE/BANDAGES/DRESSINGS) ×2 IMPLANT
BNDG ESMARK 6X9 LF (GAUZE/BANDAGES/DRESSINGS) ×2
CNTNR URN SCR LID CUP LEK RST (MISCELLANEOUS) ×1 IMPLANT
CONT SPEC 4OZ STRL OR WHT (MISCELLANEOUS) ×1
COVER SURGICAL LIGHT HANDLE (MISCELLANEOUS) ×2 IMPLANT
CUFF TOURN SGL QUICK 34 (TOURNIQUET CUFF) ×1
CUFF TRNQT CYL 34X4.125X (TOURNIQUET CUFF) ×1 IMPLANT
DECANTER SPIKE VIAL GLASS SM (MISCELLANEOUS) ×2 IMPLANT
DRAPE INCISE IOBAN 66X45 STRL (DRAPES) IMPLANT
DRAPE ORTHO SPLIT 77X108 STRL (DRAPES) ×3
DRAPE SURG ORHT 6 SPLT 77X108 (DRAPES) ×3 IMPLANT
DRAPE U-SHAPE 47X51 STRL (DRAPES) ×2 IMPLANT
DRSG AQUACEL AG ADV 3.5X14 (GAUZE/BANDAGES/DRESSINGS) ×1 IMPLANT
DURAPREP 26ML APPLICATOR (WOUND CARE) ×4 IMPLANT
ELECT CAUTERY BLADE 6.4 (BLADE) ×2 IMPLANT
ELECT REM PT RETURN 9FT ADLT (ELECTROSURGICAL) ×2
ELECTRODE REM PT RTRN 9FT ADLT (ELECTROSURGICAL) ×1 IMPLANT
FEMORAL RETAINING CRUC KNEE #4 (Knees) ×1 IMPLANT
GAUZE SPONGE 4X4 12PLY STRL (GAUZE/BANDAGES/DRESSINGS) ×2 IMPLANT
GLOVE BIOGEL PI IND STRL 7.0 (GLOVE) ×1 IMPLANT
GLOVE BIOGEL PI IND STRL 8 (GLOVE) ×1 IMPLANT
GLOVE BIOGEL PI INDICATOR 7.0 (GLOVE) ×1
GLOVE BIOGEL PI INDICATOR 8 (GLOVE) ×1
GLOVE ECLIPSE 7.0 STRL STRAW (GLOVE) ×2 IMPLANT
GLOVE ECLIPSE 8.0 STRL XLNG CF (GLOVE) ×2 IMPLANT
GLOVE SURG ENC MOIS LTX SZ6.5 (GLOVE) ×6 IMPLANT
GOWN STRL REUS W/ TWL LRG LVL3 (GOWN DISPOSABLE) ×3 IMPLANT
GOWN STRL REUS W/TWL LRG LVL3 (GOWN DISPOSABLE) ×3
HANDPIECE INTERPULSE COAX TIP (DISPOSABLE) ×1
HOOD PEEL AWAY FLYTE STAYCOOL (MISCELLANEOUS) ×6 IMPLANT
IMMOBILIZER KNEE 20 (SOFTGOODS)
IMMOBILIZER KNEE 20 THIGH 36 (SOFTGOODS) IMPLANT
IMMOBILIZER KNEE 22 UNIV (SOFTGOODS) ×1 IMPLANT
IMMOBILIZER KNEE 24 THIGH 36 (MISCELLANEOUS) IMPLANT
IMMOBILIZER KNEE 24 UNIV (MISCELLANEOUS)
INSERT TIB BEAR TRIATH 5X12 (Insert) ×1 IMPLANT
JET LAVAGE IRRISEPT WOUND (IRRIGATION / IRRIGATOR) ×2
KIT BASIN OR (CUSTOM PROCEDURE TRAY) ×2 IMPLANT
KIT TURNOVER KIT B (KITS) ×2 IMPLANT
KNEE PATELLA ASYMMETRIC 10X35 (Knees) ×1 IMPLANT
KNEE TIBIAL COMPONENT SZ5 (Knees) ×1 IMPLANT
LAVAGE JET IRRISEPT WOUND (IRRIGATION / IRRIGATOR) IMPLANT
MANIFOLD NEPTUNE II (INSTRUMENTS) ×2 IMPLANT
NDL SPNL 18GX3.5 QUINCKE PK (NEEDLE) ×1 IMPLANT
NEEDLE 22X1 1/2 (OR ONLY) (NEEDLE) ×4 IMPLANT
NEEDLE SPNL 18GX3.5 QUINCKE PK (NEEDLE) ×2 IMPLANT
NS IRRIG 1000ML POUR BTL (IV SOLUTION) ×4 IMPLANT
PACK TOTAL JOINT (CUSTOM PROCEDURE TRAY) ×2 IMPLANT
PAD ARMBOARD 7.5X6 YLW CONV (MISCELLANEOUS) ×4 IMPLANT
PAD CAST 4YDX4 CTTN HI CHSV (CAST SUPPLIES) ×1 IMPLANT
PADDING CAST COTTON 4X4 STRL (CAST SUPPLIES) ×1
PADDING CAST COTTON 6X4 STRL (CAST SUPPLIES) ×2 IMPLANT
PIN FLUTED HEDLESS FIX 3.5X1/8 (PIN) ×1 IMPLANT
SET HNDPC FAN SPRY TIP SCT (DISPOSABLE) ×1 IMPLANT
STRIP CLOSURE SKIN 1/2X4 (GAUZE/BANDAGES/DRESSINGS) ×3 IMPLANT
SUCTION FRAZIER HANDLE 10FR (MISCELLANEOUS) ×1
SUCTION TUBE FRAZIER 10FR DISP (MISCELLANEOUS) ×1 IMPLANT
SUT MNCRL AB 3-0 PS2 18 (SUTURE) ×2 IMPLANT
SUT VIC AB 0 CT1 27 (SUTURE) ×3
SUT VIC AB 0 CT1 27XBRD ANBCTR (SUTURE) ×3 IMPLANT
SUT VIC AB 1 CT1 36 (SUTURE) ×10 IMPLANT
SUT VIC AB 2-0 CT1 27 (SUTURE) ×5
SUT VIC AB 2-0 CT1 TAPERPNT 27 (SUTURE) ×4 IMPLANT
SYR 30ML LL (SYRINGE) ×6 IMPLANT
SYR TB 1ML LUER SLIP (SYRINGE) ×2 IMPLANT
TOWEL GREEN STERILE (TOWEL DISPOSABLE) ×4 IMPLANT
TOWEL GREEN STERILE FF (TOWEL DISPOSABLE) ×4 IMPLANT
TRAY CATH 16FR W/PLASTIC CATH (SET/KITS/TRAYS/PACK) IMPLANT
WATER STERILE IRR 1000ML POUR (IV SOLUTION) IMPLANT
YANKAUER SUCT BULB TIP NO VENT (SUCTIONS) ×2 IMPLANT

## 2021-09-05 NOTE — Anesthesia Procedure Notes (Signed)
Procedure Name: Intubation Date/Time: 09/05/2021 12:30 PM Performed by: Griffin Dakin, CRNA Pre-anesthesia Checklist: Patient identified, Emergency Drugs available, Suction available and Patient being monitored Patient Re-evaluated:Patient Re-evaluated prior to induction Oxygen Delivery Method: Circle system utilized Preoxygenation: Pre-oxygenation with 100% oxygen Induction Type: IV induction Ventilation: Mask ventilation without difficulty and Oral airway inserted - appropriate to patient size Laryngoscope Size: Glidescope and 4 Grade View: Grade I Tube type: Oral Number of attempts: 1 Airway Equipment and Method: Rigid stylet and Video-laryngoscopy Placement Confirmation: ETT inserted through vocal cords under direct vision, positive ETCO2 and breath sounds checked- equal and bilateral Secured at: 25 cm Tube secured with: Tape Dental Injury: Teeth and Oropharynx as per pre-operative assessment

## 2021-09-05 NOTE — Anesthesia Procedure Notes (Signed)
Anesthesia Regional Block: Adductor canal block   Pre-Anesthetic Checklist: , timeout performed,  Correct Patient, Correct Site, Correct Laterality,  Correct Procedure, Correct Position, site marked,  Risks and benefits discussed,  Surgical consent,  Pre-op evaluation,  At surgeon's request and post-op pain management  Laterality: Left  Prep: chloraprep       Needles:  Injection technique: Single-shot  Needle Type: Echogenic Needle     Needle Length: 9cm  Needle Gauge: 21     Additional Needles:   Procedures:,,,, ultrasound used (permanent image in chart),,    Narrative:  Start time: 09/05/2021 11:40 AM End time: 09/05/2021 11:48 AM Injection made incrementally with aspirations every 5 mL.  Performed by: Personally  Anesthesiologist: Collene Schlichter, MD  Additional Notes: No pain on injection. No increased resistance to injection. Injection made in 5cc increments.  Good needle visualization.  Patient tolerated procedure well.

## 2021-09-05 NOTE — Plan of Care (Signed)

## 2021-09-05 NOTE — H&P (Signed)
TOTAL KNEE ADMISSION H&P  Patient is being admitted for left total knee arthroplasty.  Subjective:  Chief Complaint:left knee pain.  HPI: Ernest Navarro., 61 y.o. male, has a history of pain and functional disability in the left knee due to arthritis and has failed non-surgical conservative treatments for greater than 12 weeks to includeNSAID's and/or analgesics, corticosteriod injections, viscosupplementation injections, flexibility and strengthening excercises, use of assistive devices, and activity modification.  Onset of symptoms was gradual, starting >10 years ago with gradually worsening course since that time. The patient noted prior procedures on the knee to include  arthroscopy and menisectomy on the left knee(s).  Patient currently rates pain in the left knee(s) at 9 out of 10 with activity. Patient has night pain, worsening of pain with activity and weight bearing, pain that interferes with activities of daily living, pain with passive range of motion, crepitus, and joint swelling.  Patient has evidence of subchondral sclerosis and joint space narrowing by imaging studies. This patient has had  a good result with his right total knee replacement . There is no active infection.  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   Obesity 06/29/2015   Obstructive sleep apnea 05/06/2015   Seasonal and perennial allergic rhinitis 05/06/2015   Hyperglycemia 06/19/2012   Essential hypertension, benign 06/19/2012   Gout 06/19/2012   Nocturia 06/19/2012   Past Medical History:  Diagnosis Date   Arthritis    bilateral knees   Essential hypertension, benign 06/19/2012   Gout 06/19/2012   Headache    Hyperglycemia 06/19/2012   March 2014 A1c 6.5 (repeat needed)    Pneumonia    Pre-diabetes    Sleep apnea     Past Surgical History:  Procedure  Laterality Date   KNEE SURGERY Right    x3 arthroscopies   TOTAL KNEE ARTHROPLASTY Right 10/28/2020   Procedure: RIGHT TOTAL KNEE ARTHROPLASTY;  Surgeon: Cammy Copa, MD;  Location: [facility] OR;  Service: Orthopedics;  Laterality: Right;    Current Facility-Administered Medications  Medication Dose Route Frequency Provider Last Rate Last Admin   ceFAZolin (ANCEF) IVPB 3g/100 mL premix  3 g Intravenous On Call to OR August Saucer Corrie Mckusick, MD       lactated ringers infusion   Intravenous Continuous Val Eagle, MD 10 mL/hr at 09/05/21 1100 New Bag at 09/05/21 1100   povidone-iodine (BETADINE) 7.5 % scrub   Topical Once Magnant, Charles L, PA-C       tranexamic acid (CYKLOKAPRON) 2,000 mg in sodium chloride 0.9 % 50 mL Topical Application  2,000 mg Topical To OR Cammy Copa, MD       tranexamic acid (CYKLOKAPRON) IVPB 1,000 mg  1,000 mg Intravenous To OR Magnant, Charles L, PA-C       Allergies  Allergen Reactions   Oxycodone Nausea Only    Makes him "delirious"    Social History   Tobacco Use   Smoking status: Never   Smokeless tobacco: Never  Substance Use Topics   Alcohol use: No    Alcohol/week: 0.0 standard drinks    Family History  Problem Relation Age of Onset   Colon cancer Neg Hx      Review of Systems  Musculoskeletal:  Positive for arthralgias.  All other systems reviewed and are negative.  Objective:  Physical Exam Vitals reviewed.  HENT:     Head: Normocephalic.     Nose: Nose normal.     Mouth/Throat:     Mouth: Mucous membranes are moist.  Eyes:     Pupils: Pupils are equal, round, and reactive to light.  Cardiovascular:     Rate and Rhythm: Normal rate.     Pulses: Normal pulses.  Pulmonary:     Effort: Pulmonary effort is normal.  Abdominal:     General: Abdomen is flat.  Musculoskeletal:     Cervical back: Normal range of motion.  Skin:    General: Skin is warm.     Capillary Refill: Capillary refill takes less than 2 seconds.   Neurological:     General: No focal deficit present.     Mental Status: He is alert.  Psychiatric:        Mood and Affect: Mood normal.  Examination of the of the left knee demonstrates intact skin.  Extensor mechanism intact.  Range of motion is about 5-1 15.  Collaterals are stable.  Dorsalis pedis pulse intact with good ankle dorsiflexion.  Vital signs in last 24 hours: Temp:  [98.3 F (36.8 C)] 98.3 F (36.8 C) (06/05 1025) Pulse Rate:  [100] 100 (06/05 1025) Resp:  [18] 18 (06/05 1025) BP: (139)/(101) 139/101 (06/05 1025) SpO2:  [98 %] 98 % (06/05 1025) Weight:  [139.3 kg] 139.3 kg (06/05 1025)  Labs:   Estimated body mass index is 41.64 kg/m as calculated from the following:   Height as of this encounter: 6' (1.829 m).   Weight as of this encounter: 139.3 kg.   Imaging Review Plain radiographs demonstrate severe degenerative joint disease of the left knee(s). The overall alignment ismild varus. The bone quality appears to be good for age and reported activity level.      Assessment/Plan:  End stage arthritis, left knee   The patient history, physical examination, clinical judgment of the provider and imaging studies are consistent with end stage degenerative joint disease of the left knee(s) and total knee arthroplasty is deemed medically necessary. The treatment options including medical management, injection therapy arthroscopy and arthroplasty were discussed at length. The risks and benefits of total knee arthroplasty were presented and reviewed. The risks due to aseptic loosening, infection, stiffness, patella tracking problems, thromboembolic complications and other imponderables were discussed. The patient acknowledged the explanation, agreed to proceed with the plan and consent was signed. Patient is being admitted for inpatient treatment for surgery, pain control, PT, OT, prophylactic antibiotics, VTE prophylaxis, progressive ambulation and ADL's and discharge  planning. The patient is planning to be discharged home with home health services     Patient's anticipated LOS is less than 2 midnights, meeting these requirements: - Younger than 1 - Lives within 1 hour of care - Has a competent adult at home to recover with post-op recover - NO history of  - Chronic pain requiring opiods  - Diabetes  - Coronary Artery Disease  - Heart failure  - Heart attack  - Stroke  - DVT/VTE  - Cardiac arrhythmia  - Respiratory Failure/COPD  - Renal failure  - Anemia  - Advanced Liver disease

## 2021-09-05 NOTE — Transfer of Care (Signed)
Immediate Anesthesia Transfer of Care Note  Patient: Ernest Navarro.  Procedure(s) Performed: LEFT TOTAL KNEE ARTHROPLASTY (Left: Knee)  Patient Location: PACU  Anesthesia Type:GA combined with regional for post-op pain  Level of Consciousness: drowsy and patient cooperative  Airway & Oxygen Therapy: Patient Spontanous Breathing and Patient connected to nasal cannula oxygen  Post-op Assessment: Report given to RN, Post -op Vital signs reviewed and stable and Patient moving all extremities  Post vital signs: Reviewed and stable  Last Vitals:  Vitals Value Taken Time  BP 132/92 09/05/21 1515  Temp    Pulse 105 09/05/21 1516  Resp 19 09/05/21 1516  SpO2 98 % 09/05/21 1516  Vitals shown include unvalidated device data.  Last Pain:  Vitals:   09/05/21 1041  TempSrc:   PainSc: 8       Patients Stated Pain Goal: 3 (09/05/21 1041)  Complications: No notable events documented.

## 2021-09-05 NOTE — Progress Notes (Signed)
A1c 7.0, not on medication at home. Diagnosis is prediabetic in chart. CBG 160. No diabetic protocol per Dr. Desmond Lope.

## 2021-09-05 NOTE — Progress Notes (Signed)
Orthopedic Tech Progress Note Patient Details:  Ernest Navarro 08-Apr-1960 [id number]  PACU RN called requesting for CPM   CPM Left Knee CPM Left Knee: On Left Knee Flexion (Degrees): 10 Left Knee Extension (Degrees): 40  Post Interventions Patient Tolerated: Well Instructions Provided: Care of device  [name] 09/05/2021, 4:19 PM

## 2021-09-05 NOTE — Brief Op Note (Signed)
   09/05/2021  3:34 PM  PATIENT:  [name].  61 y.o. male  PRE-OPERATIVE DIAGNOSIS:  left knee osteoarthritis  POST-OPERATIVE DIAGNOSIS:  left knee osteoarthritis  PROCEDURE:  Procedure(s): LEFT TOTAL KNEE ARTHROPLASTY  SURGEON:  Surgeon(s): Cammy Copa, MD  ASSISTANT: magnant pa  ANESTHESIA:   general  EBL: 50 ml    Total I/O In: 1200 [I.V.:1200] Out: 25 [Blood:25]  BLOOD ADMINISTERED: none  DRAINS: none   LOCAL MEDICATIONS USED: Marcaine morphine clonidine Exparel vancomycin powder  SPECIMEN:  No Specimen  COUNTS:  YES  TOURNIQUET:   Total Tourniquet Time Documented: Thigh (Left) - 76 minutes Total: Thigh (Left) - 76 minutes   DICTATION: .[id number]  PLAN OF CARE: Admit for overnight observation  PATIENT DISPOSITION:  PACU - hemodynamically stable

## 2021-09-05 NOTE — Op Note (Unsigned)
NAMEDESTIN, VINSANT MEDICAL RECORD NO: [id number] ACCOUNT NO: [id number] DATE OF BIRTH: [birth year] FACILITY: [facility] LOCATION: [facility] PHYSICIAN: Graylin Shiver. August Saucer, MD  Operative Report   DATE OF PROCEDURE: 09/05/2021  PREOPERATIVE DIAGNOSIS:  Left knee arthritis.  POSTOPERATIVE DIAGNOSIS:  Left knee arthritis.  PROCEDURE:  Left total knee replacement using Stryker press-fit size 4 femur, posterior cruciate retaining size 5 tibia, press-fit with 12 mm polyethylene spacer, deep dish and 35 mm 3-peg patella.  SURGEON:  Graylin Shiver. August Saucer, MD  ASSISTANT:  Karenann Cai, PA.  INDICATIONS:  The patient is a 61 year old patient with severe left knee pain refractory to nonoperative management with severe arthritis present who presents for operative management after explanation of risks and benefits.  DESCRIPTION OF PROCEDURE:  The patient was brought to the operating room where general anesthetic was induced.  Preoperative antibiotics administered.  Timeout was called.  Left leg was pre-scrubbed with alcohol and Betadine, allowed to air dry, prepped  with DuraPrep solution and draped in sterile manner.  Ioban used to cover the operative field.  The leg had about 5 degree flexion contracture, but bent to 115.  Leg was elevated and exsanguinated with the Esmarch wrap.  Tourniquet was inflated.  Timeout  was called.  Anterior approach to the knee was made.  Skin and subcutaneous tissue sharply divided.  IrriSept solution utilized.  Median parapatellar arthrotomy was made and marked with #1 Vicryl suture.  Fat pad partially excised.  Medial soft tissue  dissection was performed proportionate to the patient's mild varus deformity.  Soft tissue removed from the anterior distal femur.  Lateral patellofemoral ligament was released.  Fat pad partially excised.  Patella everted, osteophytes were removed.   Anterior horn lateral meniscus released and the ACL released.  Intramedullary alignment was then used to make  a cut perpendicular to the mechanical axis with a slight posterior tilt based on 9 mm off the least affected lateral tibial plateau.   Intramedullary alignment was then used to make a cut of 8 mm off the distal femur cut in 5 degrees of valgus.  Next, spacer block was placed.  11 spacer block gave full extension and good stability to varus and valgus stress.  Femur was sized to a size  4.  Tibia sized to a size 5, femur was cut in 3 degrees of external rotation with anterior, posterior and chamfer cuts with collateral ligaments protected.  Tibia was then prepared and keel punched.  With the tray in place a trial reduction was performed  with the 11 spacer.  Patellar cut down from 26 to 16 mm and a 3-peg patella was placed.  With the trial components in position, the patient had slight amount of laxity in about 2-3 degrees of hyperextension with the 11 mm spacer in place.  However,  patellar tracking was excellent and the patient had no liftoff.  Trial components were removed after keel punching the tibia.  Thorough irrigation was performed with 3 liters of pulsatile irrigation followed by TXA sponge allowed to soak for 3 minutes  along with tranexamic acid for 3 minutes.  Capsule was anesthetized using Marcaine, clonidine, and Exparel.  At this time, the sponges were removed after 3 minutes.  IrriSept solution utilized, Vancomycin powder placed into the tibia, press-fit tibia was  placed with good purchase obtained.  Femur, patella and the 12 spacer was placed.  The patient achieved excellent full extension without hyperextension, had very good stability to varus and valgus stress at 0,  30 and 90 degrees with excellent tracking  of the patella using no thumbs technique.  The true patella was placed as well.  Tourniquet released.  Bleeding points encountered controlled using electrocautery.  Thorough irrigation was performed with pouring irrigation. The knee was then closed over  a bolster using #1 Vicryl  suture.  Following prior arthrotomy closure knee joint again irrigated out with IrriSept solution and then vancomycin powder placed and the arthrotomy was a closed in a solution of Marcaine, morphine, clonidine injected into the  knee for postoperative pain relief.  Then, the skin was closed using interrupted inverted 0 Vicryl suture, 2-0 Vicryl suture, and 3-0 Monocryl with Steri-Strips.  Aquacel dressing applied.  Ace wrap followed by Flonnie Hailstone and knee immobilizer.  Alignment  looked good.  The patient tolerated the procedure well without immediate complications.  Luke's assistance was required at all times for retraction, opening, closing, limb positioning, drilling, sawing, pin placement, placement of the polyethylene  spacer.  His assistance was required at all times during the case.   PUS D: 09/05/2021 3:40:28 pm T: 09/05/2021 6:40:00 pm  JOB: [id number]/ [id number]

## 2021-09-06 ENCOUNTER — Other Ambulatory Visit: Payer: Self-pay

## 2021-09-06 ENCOUNTER — Encounter (HOSPITAL_COMMUNITY): Payer: Self-pay | Admitting: Orthopedic Surgery

## 2021-09-06 DIAGNOSIS — M1712 Unilateral primary osteoarthritis, left knee: Secondary | ICD-10-CM | POA: Diagnosis not present

## 2021-09-06 LAB — CBC
HCT: 45.8 % (ref 39.0–52.0)
Hemoglobin: 14.2 g/dL (ref 13.0–17.0)
MCH: 24.8 pg — ABNORMAL LOW (ref 26.0–34.0)
MCHC: 31 g/dL (ref 30.0–36.0)
MCV: 79.9 fL — ABNORMAL LOW (ref 80.0–100.0)
Platelets: 286 10*3/uL (ref 150–400)
RBC: 5.73 MIL/uL (ref 4.22–5.81)
RDW: 15.9 % — ABNORMAL HIGH (ref 11.5–15.5)
WBC: 13.8 10*3/uL — ABNORMAL HIGH (ref 4.0–10.5)
nRBC: 0 % (ref 0.0–0.2)

## 2021-09-06 LAB — GLUCOSE, CAPILLARY
Glucose-Capillary: 166 mg/dL — ABNORMAL HIGH (ref 70–99)
Glucose-Capillary: 217 mg/dL — ABNORMAL HIGH (ref 70–99)
Glucose-Capillary: 175 mg/dL — ABNORMAL HIGH (ref 70–99)

## 2021-09-06 MED ORDER — GABAPENTIN 300 MG PO CAPS
300.0000 mg | ORAL_CAPSULE | Freq: Three times a day (TID) | ORAL | Status: DC
  Administered 2021-09-06 – 2021-09-09 (×10): 300 mg via ORAL
  Filled 2021-09-06 (×10): qty 1

## 2021-09-06 MED ORDER — INSULIN ASPART 100 UNIT/ML IJ SOLN: 0.0000 [IU] | Freq: Every day | INTRAMUSCULAR | Status: DC

## 2021-09-06 MED ORDER — INSULIN ASPART 100 UNIT/ML IJ SOLN
0.0000 [IU] | Freq: Three times a day (TID) | INTRAMUSCULAR | Status: DC
  Administered 2021-09-06: 4 [IU] via SUBCUTANEOUS
  Administered 2021-09-06: 7 [IU] via SUBCUTANEOUS
  Administered 2021-09-07: 3 [IU] via SUBCUTANEOUS
  Administered 2021-09-07: 4 [IU] via SUBCUTANEOUS
  Administered 2021-09-07: 3 [IU] via SUBCUTANEOUS
  Administered 2021-09-08 – 2021-09-09 (×2): 4 [IU] via SUBCUTANEOUS

## 2021-09-06 MED ORDER — INSULIN ASPART 100 UNIT/ML IJ SOLN
4.0000 [IU] | Freq: Three times a day (TID) | INTRAMUSCULAR | Status: DC
Start: 2021-09-06 — End: 2021-09-09
  Administered 2021-09-06 – 2021-09-09 (×9): 4 [IU] via SUBCUTANEOUS

## 2021-09-06 MED ORDER — HYDROMORPHONE HCL 2 MG PO TABS
2.0000 mg | ORAL_TABLET | ORAL | Status: DC | PRN
  Administered 2021-09-06 – 2021-09-07 (×4): 4 mg via ORAL
  Filled 2021-09-06 (×4): qty 2

## 2021-09-06 NOTE — Progress Notes (Signed)
PT Cancellation Note  Patient Details Name: Ernest Navarro. MRN: [id number] DOB: 09-29-1960   Cancelled Treatment:    Reason Eval/Treat Not Completed: Pain limiting ability to participate. PA just outside of room and wants to get pain med/level worked out this am. Suggests I evaluate patient this pm. Will return later when pain level is better managed.    Jennifr Gaeta 09/06/2021, 11:17 AM

## 2021-09-06 NOTE — TOC Initial Note (Signed)
Transition of Care (TOC) - Initial/Assessment Note    Patient Details  Name: Ernest Navarro. MRN: [id number] Date of Birth: 1960/12/05  Transition of Care [facility]) CM/SW Contact:    Epifanio Lesches, RN Phone Number: 09/06/2021, 12:09 PM  Clinical Narrative:        - s/p Left total knee replacement  09/05/2021 NCM spoke with pt regarding d/c planning. Pt is from home alone. Limited support. States has 15 steps in home to get to living [place]. Pt states had similar surgery last year to R knee, and plans to transition to [facility] SNF  rehab @ d/c.  States regarding transportation to rehab., brother Ernest Navarro will provide transportation to facility.  PTA independent with ADL's.  Pt without DME needs.  PT evaluation pending....  TOC team following and will continue to assist with needs.  Expected Discharge Plan: Skilled Nursing Facility Barriers to Discharge: Continued Medical Work up   Patient Goals and CMS Choice   CMS Medicare.gov Compare Post Acute Care list provided to:: Patient    Expected Discharge Plan and Services Expected Discharge Plan: Skilled Nursing Facility   Discharge Planning Services: CM Consult   Living arrangements for the past 2 months: Single Family Home                                      Prior Living Arrangements/Services Living arrangements for the past 2 months: Single Family Home Lives with:: Self Patient language and need for interpreter reviewed:: Yes Do you feel safe going back to the place where you live?: Yes      Need for Family Participation in Patient Care: Yes (Comment) Care giver support system in place?: No (comment) Current home services: DME (BSC, RW, cane) Criminal Activity/Legal Involvement Pertinent to Current Situation/Hospitalization: No - Comment as needed  Activities of Daily Living Home Assistive Devices/Equipment: Eyeglasses ADL Screening (condition at time of admission) Patient's cognitive ability adequate to  safely complete daily activities?: Yes Is the patient deaf or have difficulty hearing?: No Does the patient have difficulty seeing, even when wearing glasses/contacts?: No Does the patient have difficulty concentrating, remembering, or making decisions?: No Patient able to express need for assistance with ADLs?: Yes Does the patient have difficulty dressing or bathing?: No Independently performs ADLs?: Yes (appropriate for developmental age) Does the patient have difficulty walking or climbing stairs?: Yes Weakness of Legs: Left Weakness of Arms/Hands: None  Permission Sought/Granted   Permission granted to share information with : Yes, Verbal Permission Granted  Share Information with NAME: Ernest Navarro ( brother) [phone]           Emotional Assessment Appearance:: Appears stated age Attitude/Demeanor/Rapport: Gracious Affect (typically observed): Accepting Orientation: : Oriented to Self, Oriented to Place, Oriented to  Time, Oriented to Situation Alcohol / Substance Use: Not Applicable Psych Involvement: No (comment)  Admission diagnosis:  S/P total knee arthroplasty, left [Z96.652] Patient Active Problem List   Diagnosis Date Noted   S/P total knee arthroplasty, left 09/05/2021   Arthritis of right knee    S/P total knee arthroplasty, right 10/28/2020   Obesity 06/29/2015   Obstructive sleep apnea 05/06/2015   Seasonal and perennial allergic rhinitis 05/06/2015   Hyperglycemia 06/19/2012   Essential hypertension, benign 06/19/2012   Gout 06/19/2012   Nocturia 06/19/2012   PCP:  Patient, No Pcp Per (Inactive) Pharmacy:   [id number] - [place], [place] - [address] [place] [address] Phone: [phone] Fax: [phone]     Social Determinants of Health (SDOH) Interventions    Readmission Risk Interventions     View : No data to display.

## 2021-09-06 NOTE — NC FL2 (Signed)
[place] MEDICAID FL2 LEVEL OF CARE SCREENING TOOL     IDENTIFICATION  Patient Name: Ernest Navarro. Birthdate: 10-11-1960 Sex: male Admission Date (Current Location): 09/05/2021  [facility] and [place] Number:  Producer, television/film/video and Address:  [facility] [place], [address] [place], [place] [address]      Provider Number: [id number]  Attending Physician Name and Address:  Cammy Copa, MD  Relative Name and Phone Number:       Current Level of Care: Hospital Recommended Level of Care: Skilled Nursing Facility Prior Approval Number:    Date Approved/Denied:   PASRR Number: [id number]  Discharge Plan: SNF    Current Diagnoses: Patient Active Problem List   Diagnosis Date Noted   S/P total knee arthroplasty, left 09/05/2021   Arthritis of right knee    S/P total knee arthroplasty, right 10/28/2020   Obesity 06/29/2015   Obstructive sleep apnea 05/06/2015   Seasonal and perennial allergic rhinitis 05/06/2015   Hyperglycemia 06/19/2012   Essential hypertension, benign 06/19/2012   Gout 06/19/2012   Nocturia 06/19/2012    Orientation RESPIRATION BLADDER Height & Weight     Self, Time, Situation, Place  Normal Continent Weight: (!) 307 lb (139.3 kg) Height:  6' (182.9 cm)  BEHAVIORAL SYMPTOMS/MOOD NEUROLOGICAL BOWEL NUTRITION STATUS      Continent Diet (Refer to d/c summary)  AMBULATORY STATUS COMMUNICATION OF NEEDS Skin   Extensive Assist Verbally Normal (s/p L TKA)                       Personal Care Assistance Level of Assistance  Bathing, Feeding, Dressing Bathing Assistance: Limited assistance Feeding assistance: Independent Dressing Assistance: Limited assistance     Functional Limitations Info  Sight, Hearing, Speech Sight Info: Adequate Hearing Info: Adequate Speech Info: Adequate    SPECIAL CARE FACTORS FREQUENCY  OT (By licensed OT), PT (By licensed PT)     PT Frequency: 5x week evaluate and treat OT  Frequency: 5x week evaluate and treat            Contractures Contractures Info: Not present    Additional Factors Info  Code Status, Allergies Code Status Info: Full code Allergies Info: Oxycodone           Current Medications (09/06/2021):  This is the current hospital active medication list Current Facility-Administered Medications  Medication Dose Route Frequency Provider Last Rate Last Admin   acetaminophen (TYLENOL) tablet 325-650 mg  325-650 mg Oral Q6H PRN Magnant, Charles L, PA-C       amLODipine (NORVASC) tablet 10 mg  10 mg Oral Daily Magnant, Charles L, PA-C   10 mg at 09/06/21 0175   aspirin chewable tablet 81 mg  81 mg Oral BID Magnant, Charles L, PA-C   81 mg at 09/06/21 1025   atorvastatin (LIPITOR) tablet 20 mg  20 mg Oral q morning Magnant, Charles L, PA-C   20 mg at 09/06/21 8527   celecoxib (CELEBREX) capsule 100 mg  100 mg Oral BID Magnant, Charles L, PA-C   100 mg at 09/06/21 7824   docusate sodium (COLACE) capsule 100 mg  100 mg Oral BID Magnant, Charles L, PA-C   100 mg at 09/06/21 2353   gabapentin (NEURONTIN) capsule 300 mg  300 mg Oral TID Magnant, Charles L, PA-C   300 mg at 09/06/21 1249   HYDROmorphone (DILAUDID) injection 0.5-1 mg  0.5-1 mg Intravenous Q4H PRN Magnant, Joycie Peek, PA-C  1 mg at 09/05/21 2054   HYDROmorphone (DILAUDID) tablet 2-4 mg  2-4 mg Oral Q4H PRN Magnant, Charles L, PA-C   4 mg at 09/06/21 1346   insulin aspart (novoLOG) injection 0-20 Units  0-20 Units Subcutaneous TID WC Magnant, Charles L, PA-C   4 Units at 09/06/21 1251   insulin aspart (novoLOG) injection 0-5 Units  0-5 Units Subcutaneous QHS Magnant, Charles L, PA-C       insulin aspart (novoLOG) injection 4 Units  4 Units Subcutaneous TID WC Magnant, Charles L, PA-C   4 Units at 09/06/21 1252   menthol-cetylpyridinium (CEPACOL) lozenge 3 mg  1 lozenge Oral PRN Magnant, Charles L, PA-C       Or   phenol (CHLORASEPTIC) mouth spray 1 spray  1 spray Mouth/Throat PRN Magnant,  Charles L, PA-C       methocarbamol (ROBAXIN) tablet 500 mg  500 mg Oral Q6H PRN Magnant, Charles L, PA-C   500 mg at 09/06/21 0001   Or   methocarbamol (ROBAXIN) 500 mg in dextrose 5 % 50 mL IVPB  500 mg Intravenous Q6H PRN Magnant, Charles L, PA-C       metoCLOPramide (REGLAN) tablet 5-10 mg  5-10 mg Oral Q8H PRN Magnant, Charles L, PA-C       Or   metoCLOPramide (REGLAN) injection 5-10 mg  5-10 mg Intravenous Q8H PRN Magnant, Charles L, PA-C       ondansetron (ZOFRAN) tablet 4 mg  4 mg Oral Q6H PRN Magnant, Charles L, PA-C       Or   ondansetron (ZOFRAN) injection 4 mg  4 mg Intravenous Q6H PRN Magnant, Charles L, PA-C   4 mg at 09/05/21 2144     Discharge Medications: Please see discharge summary for a list of discharge medications.  Relevant Imaging Results:  Relevant Lab Results:   Additional Information SSN: [id number]  Lorri Frederick, LCSW

## 2021-09-06 NOTE — Anesthesia Postprocedure Evaluation (Signed)
Anesthesia Post Note  Patient: Ernest Navarro.  Procedure(s) Performed: LEFT TOTAL KNEE ARTHROPLASTY (Left: Knee)     Patient location during evaluation: PACU Anesthesia Type: General and Regional Level of consciousness: awake and alert Pain management: pain level controlled Vital Signs Assessment: post-procedure vital signs reviewed and stable Respiratory status: spontaneous breathing, nonlabored ventilation, respiratory function stable and patient connected to nasal cannula oxygen Cardiovascular status: blood pressure returned to baseline and stable Postop Assessment: no apparent nausea or vomiting Anesthetic complications: no   No notable events documented.  Last Vitals:  Vitals:   09/06/21 0506 09/06/21 1547  BP: (!) 176/97 139/85  Pulse: 96 91  Resp: 18   Temp: 36.8 C 36.7 C  SpO2: 97% 95%    Last Pain:  Vitals:   09/06/21 1547  TempSrc: Oral  PainSc:                  [name] [name]

## 2021-09-06 NOTE — Evaluation (Signed)
Physical Therapy Evaluation Patient Details Name: Ernest Navarro. MRN: [id number] DOB: June 28, 1960 Today's Date: 09/06/2021  History of Present Illness  Patient is a 61 year old male s/p L TKA. PMH includes: obesity, HTN, OA, gout R TKA 10/28/20.  Clinical Impression  Patient received in bed, he is reluctant to move. Was given a lot of pain medicine (due to reported 9/10 pain) and now feels nauseated and dizzy.  Patient is agreeable to PT session. He required min A for bed mobility, min guard for sit to stand and was able to take a few side steps at edge of bed. He will continue to benefit from skilled PT to improve strength, rom, and independence.          Recommendations for follow up therapy are one component of a multi-disciplinary discharge planning process, led by the attending physician.  Recommendations may be updated based on patient status, additional functional criteria and insurance authorization.  Follow Up Recommendations Skilled nursing-short term rehab (<3 hours/day)    Assistance Recommended at Discharge Frequent or constant Supervision/Assistance  Patient can return home with the following  A lot of help with walking and/or transfers;A lot of help with bathing/dressing/bathroom;Help with stairs or ramp for entrance;Assist for transportation;Assistance with cooking/housework;Direct supervision/assist for medications management    Equipment Recommendations None recommended by PT  Recommendations for Other Services       Functional Status Assessment Patient has had a recent decline in their functional status and demonstrates the ability to make significant improvements in function in a reasonable and predictable amount of time.     Precautions / Restrictions Precautions Precautions: Fall Required Braces or Orthoses: Knee Immobilizer - Left Restrictions Weight Bearing Restrictions: Yes LLE Weight Bearing: Weight bearing as tolerated      Mobility  Bed  Mobility Overal bed mobility: Needs Assistance Bed Mobility: Supine to Sit, Sit to Supine     Supine to sit: Min assist, HOB elevated Sit to supine: Min assist   General bed mobility comments: assistance to bring L LE on and off bed.    Transfers Overall transfer level: Needs assistance Equipment used: Rolling walker (2 wheels) Transfers: Sit to/from Stand Sit to Stand: Min guard           General transfer comment: cues for hand placement. L LE placement due to KI donned    Ambulation/Gait Ambulation/Gait assistance: Min guard Gait Distance (Feet): 3 Feet Assistive device: Rolling walker (2 wheels) Gait Pattern/deviations: Step-to pattern Gait velocity: decr     General Gait Details: performed side steps at edge of bed. Pain limited. Reporting dizzy, nauseated during session  Stairs            Wheelchair Mobility    Modified Rankin (Stroke Patients Only)       Balance Overall balance assessment: Needs assistance Sitting-balance support: Feet supported Sitting balance-Leahy Scale: Good     Standing balance support: Bilateral upper extremity supported, During functional activity, Reliant on assistive device for balance Standing balance-Leahy Scale: Fair                               Pertinent Vitals/Pain Pain Assessment Pain Assessment: 0-10 Pain Score: 8  Pain Location: L knee Pain Descriptors / Indicators: Discomfort, Sore, Grimacing, Guarding Pain Intervention(s): Monitored during session, Premedicated before session, Repositioned, Ice applied    Home Living Family/patient expects to be discharged to:: Skilled nursing facility Living Arrangements: Alone  Prior Function Prior Level of Function : Independent/Modified Independent             Mobility Comments: was independent ADLs Comments: was independent     Hand Dominance        Extremity/Trunk Assessment        Lower Extremity  Assessment Lower Extremity Assessment: LLE deficits/detail LLE: Unable to fully assess due to pain LLE Coordination: decreased gross motor    Cervical / Trunk Assessment Cervical / Trunk Assessment: Normal  Communication   Communication: No difficulties  Cognition Arousal/Alertness: Awake/alert Behavior During Therapy: WFL for tasks assessed/performed, Anxious Overall Cognitive Status: Within Functional Limits for tasks assessed                                 General Comments: anxious about moving and pain        General Comments      Exercises Total Joint Exercises Ankle Circles/Pumps: AROM, Both, 10 reps Quad Sets: AROM, Left, 5 reps Gluteal Sets: AROM, Left, 5 reps Heel Slides: AAROM, Left, 5 reps Hip ABduction/ADduction: AAROM, Left, 10 reps Straight Leg Raises: AAROM, Left, 5 reps Goniometric ROM: 0-70 approximate   Assessment/Plan    PT Assessment Patient needs continued PT services  PT Problem List Decreased strength;Decreased mobility;Decreased range of motion;Decreased activity tolerance;Decreased balance;Pain;Obesity       PT Treatment Interventions DME instruction;Therapeutic exercise;Gait training;Balance training;Functional mobility training;Therapeutic activities;Patient/family education    PT Goals (Current goals can be found in the Care Plan section)  Acute Rehab PT Goals Patient Stated Goal: to go to rehab PT Goal Formulation: With patient Time For Goal Achievement: 09/20/21 Potential to Achieve Goals: Good    Frequency BID     Co-evaluation               AM-PAC PT "6 Clicks" Mobility  Outcome Measure Help needed turning from your back to your side while in a flat bed without using bedrails?: A Lot Help needed moving from lying on your back to sitting on the side of a flat bed without using bedrails?: A Lot Help needed moving to and from a bed to a chair (including a wheelchair)?: A Lot Help needed standing up from a  chair using your arms (e.g., wheelchair or bedside chair)?: A Little Help needed to walk in hospital room?: A Lot Help needed climbing 3-5 steps with a railing? : A Lot 6 Click Score: 13    End of Session Equipment Utilized During Treatment: Gait belt Activity Tolerance: Patient limited by pain Patient left: in bed;with call bell/phone within reach Nurse Communication: Mobility status PT Visit Diagnosis: Other abnormalities of gait and mobility (R26.89);Muscle weakness (generalized) (M62.81);Difficulty in walking, not elsewhere classified (R26.2);Pain Pain - Right/Left: Left Pain - part of body: Knee    Time: [id number] PT Time Calculation (min) (ACUTE ONLY): 33 min   Charges:   PT Evaluation $PT Eval Moderate Complexity: 1 Mod PT Treatments $Therapeutic Activity: 8-22 mins        Meha Vidrine, PT, GCS 09/06/21,3:09 PM

## 2021-09-06 NOTE — Progress Notes (Signed)
  Subjective: Ernest Navarro. is a 61 y.o. male s/p left TKA.  They are POD 1.  Pt's pain is controlled.  Pt denies numbness/tingling/weakness.  Has not ambulated yet.  Has not worked with physical therapy yet.  Denies any chest pain, shortness of breath, calf pain, abdominal pain.  Has not had a bowel movement yet but is passing gas.  Objective: Vital signs in last 24 hours: Temp:  [97.6 F (36.4 C)-98.4 F (36.9 C)] 98.3 F (36.8 C) (06/06 0506) Pulse Rate:  [85-108] 96 (06/06 0506) Resp:  [14-19] 18 (06/06 0506) BP: (132-176)/(85-105) 176/97 (06/06 0506) SpO2:  [94 %-98 %] 97 % (06/06 0506)  Intake/Output from previous day: 06/05 0701 - 06/06 0700 In: 1200 [I.V.:1200] Out: 625 [Urine:600; Blood:25] Intake/Output this shift: No intake/output data recorded.  Exam:  No gross blood or drainage overlying the dressing 2+ DP pulse Sensation intact distally in the left foot Able to dorsiflex and plantarflex the left foot Negative Homans' sign.  No significant calf tenderness.  Able to perform straight leg raise without extensor lag.   Labs: Recent Labs    09/06/21 0224  HGB 14.2   Recent Labs    09/06/21 0224  WBC 13.8*  RBC 5.73  HCT 45.8  PLT 286   No results for input(s): NA, K, CL, CO2, BUN, CREATININE, GLUCOSE, CALCIUM in the last 72 hours. No results for input(s): LABPT, INR in the last 72 hours.  Assessment/Plan: Pt is POD 1 s/p left TKA.    -Plan to discharge to SNF in coming days pending patient's pain and PT eval.  He states that he has already set up arrangement with [facility]  -WBAT with a walker    Khameron Gruenwald L Catlin Doria 09/06/2021, 10:44 AM

## 2021-09-06 NOTE — Plan of Care (Signed)

## 2021-09-07 ENCOUNTER — Telehealth: Payer: Self-pay | Admitting: Orthopedic Surgery

## 2021-09-07 DIAGNOSIS — M1712 Unilateral primary osteoarthritis, left knee: Secondary | ICD-10-CM | POA: Diagnosis not present

## 2021-09-07 LAB — CBC
HCT: 46.6 % (ref 39.0–52.0)
Hemoglobin: 13.8 g/dL (ref 13.0–17.0)
MCH: 24.2 pg — ABNORMAL LOW (ref 26.0–34.0)
MCHC: 29.6 g/dL — ABNORMAL LOW (ref 30.0–36.0)
MCV: 81.6 fL (ref 80.0–100.0)
Platelets: 284 10*3/uL (ref 150–400)
RBC: 5.71 MIL/uL (ref 4.22–5.81)
RDW: 16.3 % — ABNORMAL HIGH (ref 11.5–15.5)
WBC: 15.8 10*3/uL — ABNORMAL HIGH (ref 4.0–10.5)
nRBC: 0 % (ref 0.0–0.2)

## 2021-09-07 LAB — GLUCOSE, CAPILLARY
Glucose-Capillary: 371 mg/dL — ABNORMAL HIGH (ref 70–99)
Glucose-Capillary: 174 mg/dL — ABNORMAL HIGH (ref 70–99)
Glucose-Capillary: 141 mg/dL — ABNORMAL HIGH (ref 70–99)
Glucose-Capillary: 135 mg/dL — ABNORMAL HIGH (ref 70–99)
Glucose-Capillary: 170 mg/dL — ABNORMAL HIGH (ref 70–99)

## 2021-09-07 MED ORDER — HYDROMORPHONE HCL 2 MG PO TABS
2.0000 mg | ORAL_TABLET | ORAL | Status: DC
  Administered 2021-09-07 – 2021-09-09 (×13): 4 mg via ORAL
  Filled 2021-09-07 (×13): qty 2

## 2021-09-07 MED ORDER — INSULIN GLARGINE-YFGN 100 UNIT/ML ~~LOC~~ SOLN
14.0000 [IU] | Freq: Every day | SUBCUTANEOUS | Status: DC
  Administered 2021-09-07 – 2021-09-09 (×3): 14 [IU] via SUBCUTANEOUS
  Filled 2021-09-07 (×3): qty 0.14

## 2021-09-07 NOTE — Telephone Encounter (Signed)
Good to know. Spoke with Ernest Navarro on the phone earlier

## 2021-09-07 NOTE — Progress Notes (Signed)
Physical Therapy Treatment Patient Details Name: Ernest Navarro. MRN: [id number] DOB: Jan 23, 1961 Today's Date: 09/07/2021   History of Present Illness Patient is a 61 year old male s/p L TKA. PMH includes: obesity, HTN, OA, gout R TKA 10/28/20.    PT Comments    Pt received in supine, agreeable to therapy session ~2 hours after premedication, with emphasis on transfer training and gait progression. Pt with good tolerance for gait trial short distance in room with RW support and chair follow for safety, needing up to minA. Pt stood from elevated bed and chair heights with min guard assist. Pt reports 7/10 modified RPE (fatigue) at end of session. Pt continues to benefit from PT services to progress toward functional mobility goals.    Recommendations for follow up therapy are one component of a multi-disciplinary discharge planning process, led by the attending physician.  Recommendations may be updated based on patient status, additional functional criteria and insurance authorization.  Follow Up Recommendations  Skilled nursing-short term rehab (<3 hours/day)     Assistance Recommended at Discharge Frequent or constant Supervision/Assistance  Patient can return home with the following A lot of help with walking and/or transfers;A lot of help with bathing/dressing/bathroom;Help with stairs or ramp for entrance;Assist for transportation;Assistance with cooking/housework;Direct supervision/assist for medications management   Equipment Recommendations  None recommended by PT    Recommendations for Other Services       Precautions / Restrictions Precautions Precautions: Fall Precaution Comments: weak L quad set, premedicate for pain Required Braces or Orthoses: Knee Immobilizer - Left Knee Immobilizer - Left:  (can't perform SLR AM or PM) Restrictions Weight Bearing Restrictions: Yes LLE Weight Bearing: Weight bearing as tolerated     Mobility  Bed Mobility Overal bed mobility:  Needs Assistance Bed Mobility: Supine to Sit, Sit to Supine     Supine to sit: HOB elevated, Min guard Sit to supine: Min assist   General bed mobility comments: Pt with improved technique in afternoon, HOB very elevated per pt request, min guard for LLE    Transfers Overall transfer level: Needs assistance Equipment used: Rolling walker (2 wheels) Transfers: Bed to chair/wheelchair/BSC, Sit to/from Stand Sit to Stand: Min guard, +2 safety/equipment, From elevated surface          Lateral/Scoot Transfers: Min assist General transfer comment: from elevated bed and after verbal/visual demo pt with good carryover of technique to stand to RW; increased time to reach upright    Ambulation/Gait Ambulation/Gait assistance: Min assist, +2 safety/equipment Gait Distance (Feet): 30 Feet Assistive device: Rolling walker (2 wheels) Gait Pattern/deviations: Step-to pattern       General Gait Details: cues for step sequencing wtih RW, up to minA for dense cues/activity pacing instruction, chair follow for safety due to evolving sx "wooziness", BP stable   Stairs Stairs:  (pt unable due to fatigue/pain)           Wheelchair Mobility    Modified Rankin (Stroke Patients Only)       Balance Overall balance assessment: Needs assistance Sitting-balance support: Feet supported Sitting balance-Leahy Scale: Good     Standing balance support: Bilateral upper extremity supported, During functional activity, Reliant on assistive device for balance Standing balance-Leahy Scale: Fair Standing balance comment: pt able to stand with U UE support of RW while BP assessed, needs BUE support of RW for gait  Cognition Arousal/Alertness: Awake/alert Behavior During Therapy: WFL for tasks assessed/performed, Anxious Overall Cognitive Status: Within Functional Limits for tasks assessed                                 General Comments:  A&O, pt reports pain more controlled in PM, participatory as able.        Exercises Total Joint Exercises Ankle Circles/Pumps: AROM, Both, 10 reps Quad Sets: AROM, Left, Supine, 5 reps Short Arc Quad: AAROM, Left, 10 reps, Supine Heel Slides: AAROM, Left, 10 reps, Supine Hip ABduction/ADduction: AAROM, Left, 10 reps, Supine Straight Leg Raises: AAROM, Left, 10 reps, Supine (noted quad leg, unable to perform AROM) Long Arc Quad: AAROM, Left, 10 reps, Seated Knee Flexion: AAROM, Left, 5 reps, Seated (not well tolerated, ~90* only seated EOB) Goniometric ROM: pain limiting, plan to re-measure in PM session after pain meds given    General Comments General comments (skin integrity, edema, etc.): BP 164/94 (107) seated EOB, BP 149/101 (115) standing after ~6 mins      Pertinent Vitals/Pain Pain Assessment Pain Assessment: Faces Pain Score: 9  Faces Pain Scale: Hurts even more Pain Location: L knee Pain Descriptors / Indicators: Discomfort, Sore, Grimacing, Guarding Pain Intervention(s): Monitored during session, Repositioned, Premedicated before session, Ice applied           PT Goals (current goals can now be found in the care plan section) Acute Rehab PT Goals Patient Stated Goal: to go to rehab PT Goal Formulation: With patient Time For Goal Achievement: 09/20/21 Progress towards PT goals: Progressing toward goals    Frequency    BID      PT Plan Current plan remains appropriate       AM-PAC PT "6 Clicks" Mobility   Outcome Measure  Help needed turning from your back to your side while in a flat bed without using bedrails?: A Lot Help needed moving from lying on your back to sitting on the side of a flat bed without using bedrails?: A Little Help needed moving to and from a bed to a chair (including a wheelchair)?: A Little Help needed standing up from a chair using your arms (e.g., wheelchair or bedside chair)?: A Little Help needed to walk in hospital room?:  A Little Help needed climbing 3-5 steps with a railing? : Total 6 Click Score: 15    End of Session Equipment Utilized During Treatment: Gait belt Activity Tolerance: Patient tolerated treatment well Patient left: with call bell/phone within reach;in chair (CPM placed, iceman resting open over LLE, NT notified he wants a bath) Nurse Communication: Mobility status;Other (comment) (pt wants a bath) PT Visit Diagnosis: Other abnormalities of gait and mobility (R26.89);Muscle weakness (generalized) (M62.81);Difficulty in walking, not elsewhere classified (R26.2);Pain Pain - Right/Left: Left Pain - part of body: Knee     Time: 2297-9892 PT Time Calculation (min) (ACUTE ONLY): 29 min  Charges:  $Gait Training: 8-22 mins $Therapeutic Activity: 8-22 mins                     Hudsyn Barich P., PTA Acute Rehabilitation Services Secure Chat Preferred 9a-5:30pm Office: [phone]    Dorathy Kinsman [facility] 09/07/2021, 4:58 PM

## 2021-09-07 NOTE — Progress Notes (Signed)
Physical Therapy Treatment Patient Details Name: Ernest Navarro. MRN: [id number] DOB: March 07, 1961 Today's Date: 09/07/2021   History of Present Illness Patient is a 61 year old male s/p L TKA. PMH includes: obesity, HTN, OA, gout R TKA 10/28/20.    PT Comments    Pt received in supine, c/o severe pain and agreeable to therapy session with encouragement. Pt with noted quad lag with SLR exercise and weak LLE quad set in supine, with pain limiting flexion to 30-50* in supine. Pt with improved knee flexion ROM seated EOB but still needs AAROM to perform LAQ exercise, pt refusing to attempt STS due to increased LLE pain and nausea after sitting EOB ~10 mins. Pt BP stable supine and seated. Pt performed bed mobility with heavy reliance on bed rails and seated scooting with up to minA. Anticipate pt will need +2 physical assist for safety for standing trials in afternoon. CPM machine placed in supine. Pt continues to benefit from PT services to progress toward functional mobility goals.   Recommendations for follow up therapy are one component of a multi-disciplinary discharge planning process, led by the attending physician.  Recommendations may be updated based on patient status, additional functional criteria and insurance authorization.  Follow Up Recommendations  Skilled nursing-short term rehab (<3 hours/day)     Assistance Recommended at Discharge Frequent or constant Supervision/Assistance  Patient can return home with the following A lot of help with walking and/or transfers;A lot of help with bathing/dressing/bathroom;Help with stairs or ramp for entrance;Assist for transportation;Assistance with cooking/housework;Direct supervision/assist for medications management   Equipment Recommendations  None recommended by PT    Recommendations for Other Services       Precautions / Restrictions Precautions Precautions: Fall Precaution Comments: weak L quad set, premedicate for  pain Restrictions Weight Bearing Restrictions: Yes LLE Weight Bearing: Weight bearing as tolerated     Mobility  Bed Mobility Overal bed mobility: Needs Assistance Bed Mobility: Supine to Sit, Sit to Supine     Supine to sit: Min assist, HOB elevated Sit to supine: Min assist   General bed mobility comments: assistance to bring L LE on and off bed, discussed techniques for increased independence such as using strap or hooking under "good" leg but pt unable to tolerate this method due to pain    Transfers Overall transfer level: Needs assistance   Transfers: Bed to chair/wheelchair/BSC            Lateral/Scoot Transfers: Min assist General transfer comment: Pt defers standing due to increased nausea/pain after sitting EOB ~10 mins for LE therex, pt able to scoot laterally toward HOB ~18" with minA and transfer pad assist, pt using LLE and BUE to assist.    Ambulation/Gait               General Gait Details: pt refusing due to severe LLE pain/evolving nausea   Stairs             Wheelchair Mobility    Modified Rankin (Stroke Patients Only)       Balance Overall balance assessment: Needs assistance Sitting-balance support: Feet supported Sitting balance-Leahy Scale: Good         Standing balance comment: pt refusing due to pain/nausea, agreeable to try in PM                            Cognition Arousal/Alertness: Awake/alert Behavior During Therapy: [facility] for tasks assessed/performed, Anxious Overall Cognitive Status:  Within Functional Limits for tasks assessed                                 General Comments: anxious about moving and pain, agreeable to EOB/seated scooting only in AM (pain meds "off schedule" by 1hr at 10am) so per pt pain now not well controlled.        Exercises Total Joint Exercises Ankle Circles/Pumps: AROM, Both, 10 reps Quad Sets: AROM, Left, 10 reps, Supine Short Arc Quad: AAROM, Left, 10  reps, Supine Heel Slides: AAROM, Left, 10 reps, Supine Hip ABduction/ADduction: AAROM, Left, 10 reps, Supine Straight Leg Raises: AAROM, Left, 10 reps, Supine (noted quad leg, unable to perform AROM) Long Arc Quad: AAROM, Left, 10 reps, Seated Knee Flexion: AAROM, Left, 5 reps, Seated (not well tolerated, ~90* only seated EOB) Goniometric ROM: pain limiting, plan to re-measure in PM session after pain meds given    General Comments General comments (skin integrity, edema, etc.): BP 148/92 (108) seated EOB; BP 154/98 (112) after return to supine, nausea improved with return to supine.      Pertinent Vitals/Pain Pain Assessment Pain Assessment: 0-10 Pain Score: 9  Pain Location: L knee Pain Descriptors / Indicators: Discomfort, Sore, Grimacing, Guarding Pain Intervention(s): Monitored during session, Limited activity within patient's tolerance, Premedicated before session, Repositioned, Ice applied           PT Goals (current goals can now be found in the care plan section) Acute Rehab PT Goals Patient Stated Goal: to go to rehab PT Goal Formulation: With patient Time For Goal Achievement: 09/20/21 Progress towards PT goals: Progressing toward goals    Frequency    BID      PT Plan Current plan remains appropriate       AM-PAC PT "6 Clicks" Mobility   Outcome Measure  Help needed turning from your back to your side while in a flat bed without using bedrails?: A Lot Help needed moving from lying on your back to sitting on the side of a flat bed without using bedrails?: A Lot Help needed moving to and from a bed to a chair (including a wheelchair)?: A Lot Help needed standing up from a chair using your arms (e.g., wheelchair or bedside chair)?: A Lot Help needed to walk in hospital room?: Total Help needed climbing 3-5 steps with a railing? : Total 6 Click Score: 10    End of Session   Activity Tolerance: Patient limited by pain Patient left: in bed;with call  bell/phone within reach;with bed alarm set;Other (comment) (CPM placed, iceman resting open over LLE on top of CPM as pt reports cold sensitivity when it is touching his skin) Nurse Communication: Mobility status;Other (comment) (CPM needs to be removed by 12:45 pm) PT Visit Diagnosis: Other abnormalities of gait and mobility (R26.89);Muscle weakness (generalized) (M62.81);Difficulty in walking, not elsewhere classified (R26.2);Pain Pain - Right/Left: Left Pain - part of body: Knee     Time: 1100-1149 PT Time Calculation (min) (ACUTE ONLY): 49 min  Charges:  $Therapeutic Exercise: 23-37 mins $Therapeutic Activity: 8-22 mins                     Press Casale P., PTA Acute Rehabilitation Services Secure Chat Preferred 9a-5:30pm Office: [phone]    Dorathy Kinsman [facility] 09/07/2021, 12:06 PM

## 2021-09-07 NOTE — Progress Notes (Addendum)
NCM requested benefits check from CMA for pt's in network non emergent ambulance  transportation services. Gae Gallop RN,BSN,CM [phone]

## 2021-09-07 NOTE — TOC Progression Note (Signed)
Transition of Care (TOC) - Progression Note    Patient Details  Name: Ernest Navarro. MRN: [id number] Date of Birth: 27-Jul-1960  Transition of Care [facility]) CM/SW Contact  Joanne Chars, LCSW Phone Number: 09/07/2021, 8:31 AM  Clinical Narrative:   Message from [facility].  Pt is prescheduled there for rehab admit, they have submitted for auth and can take him once [name] is approved.      Expected Discharge Plan: [place] Barriers to Discharge: Continued Medical Work up  Expected Discharge Plan and Services Expected Discharge Plan: [place]   Discharge Planning Services: CM Consult   Living arrangements for the past 2 months: Single Family Home                                       Social Determinants of Health (SDOH) Interventions    Readmission Risk Interventions     View : No data to display.

## 2021-09-07 NOTE — Progress Notes (Signed)
  Subjective: Patient stable.  Pain improved on higher dose of Dilaudid.  We will make that scheduled as opposed to as needed   Objective: Vital signs in last 24 hours: Temp:  [98.1 F (36.7 C)-99 F (37.2 C)] 99 F (37.2 C) (06/07 0541) Pulse Rate:  [91-102] 96 (06/07 0541) Resp:  [15] 15 (06/07 0541) BP: (139-158)/(85-89) 158/85 (06/07 0541) SpO2:  [95 %-100 %] 100 % (06/07 0541)  Intake/Output from previous day: No intake/output data recorded. Intake/Output this shift: No intake/output data recorded.  Exam:  Sensation intact distally Intact pulses distally Dorsiflexion/Plantar flexion intact  Labs: Recent Labs    09/06/21 0224 09/07/21 0320  HGB 14.2 13.8   Recent Labs    09/06/21 0224 09/07/21 0320  WBC 13.8* 15.8*  RBC 5.73 5.71  HCT 45.8 46.6  PLT 286 284   No results for input(s): NA, K, CL, CO2, BUN, CREATININE, GLUCOSE, CALCIUM in the last 72 hours. No results for input(s): LABPT, INR in the last 72 hours.  Assessment/Plan: Plan at this time is mobilization today.  CPM machine today 3 times a day.  Dilaudid to be scheduled as opposed to as needed.  Ready for discharge to skilled nursing tomorrow.   Ernest Navarro 09/07/2021, 7:31 AM

## 2021-09-07 NOTE — Plan of Care (Signed)

## 2021-09-07 NOTE — Progress Notes (Signed)
Inpatient Diabetes Program Recommendations  AACE/ADA: New Consensus Statement on Inpatient Glycemic Control (2015)  Target Ranges:  Prepandial:   less than 140 mg/dL      Peak postprandial:   less than 180 mg/dL (1-2 hours)      Critically ill patients:  140 - 180 mg/dL   Lab Results  Component Value Date   GLUCAP 371 (H) 09/07/2021   HGBA1C 7.0 (H) 08/30/2021    Review of Glycemic Control  Latest Reference Range & Units 09/06/21 16:48 09/06/21 20:43 09/07/21 07:38  Glucose-Capillary 70 - 99 mg/dL 827 (H) 078 (H) 675 (H)  (H): Data is abnormally high Diabetes history: Type 2 Dm Outpatient Diabetes medications: none Current orders for Inpatient glycemic control: Novolog 0-20 units TID, Novolog 0-5 units QHS, Novolog 4 units TID Decadron 10 mg x 1 on 6/5  Inpatient Diabetes Program Recommendations:    Consider: -Changing diet to carb modified -Adding Semglee 14 units QD  Thanks, Lujean Rave, MSN, RNC-OB Diabetes Coordinator [phone] (8a-5p)

## 2021-09-07 NOTE — Plan of Care (Signed)

## 2021-09-07 NOTE — Telephone Encounter (Signed)
Pt is calling stating he has his own transportation so  he should be ok to start at [facility]

## 2021-09-08 DIAGNOSIS — M1712 Unilateral primary osteoarthritis, left knee: Secondary | ICD-10-CM | POA: Diagnosis not present

## 2021-09-08 LAB — GLUCOSE, CAPILLARY
Glucose-Capillary: 174 mg/dL — ABNORMAL HIGH (ref 70–99)
Glucose-Capillary: 119 mg/dL — ABNORMAL HIGH (ref 70–99)
Glucose-Capillary: 121 mg/dL — ABNORMAL HIGH (ref 70–99)
Glucose-Capillary: 131 mg/dL — ABNORMAL HIGH (ref 70–99)

## 2021-09-08 NOTE — Progress Notes (Signed)
Physical Therapy Treatment Patient Details Name: Ernest Navarro. MRN: [id number] DOB: 1961/03/10 Today's Date: 09/08/2021   History of Present Illness Patient is a 61 year old male s/p L TKA. PMH includes: obesity, HTN, OA, gout R TKA 10/28/20.    PT Comments    Pt received supine with HOB elevated with CPM on, agreeable to session with continued good progress towards acute goals and motivated to participate. Pt able to self mobilize to EOB with gait belt used as leg lifter without physical assist and min guard for safety. Once seated EOB pt able to come to standing with RW with min guard from bed in lowest position. Pt demonstrating increased ambulation tolerance with min guard and light min assist for cues for sequencing and safety. Pt with fair tolerance for LE therex for increased ROM and strength although continues to demonstrate poor quad activation with weak quad set, quad lag and inability to actively perform SLR. Plan to continue mobility progression in PM session. Current plan remains appropriate to address deficits and maximize functional independence and decrease caregiver burden. Pt continues to benefit from skilled PT services to progress toward functional mobility goals.    Recommendations for follow up therapy are one component of a multi-disciplinary discharge planning process, led by the attending physician.  Recommendations may be updated based on patient status, additional functional criteria and insurance authorization.  Follow Up Recommendations  Skilled nursing-short term rehab (<3 hours/day)     Assistance Recommended at Discharge Frequent or constant Supervision/Assistance  Patient can return home with the following A lot of help with walking and/or transfers;A lot of help with bathing/dressing/bathroom;Help with stairs or ramp for entrance;Assist for transportation;Assistance with cooking/housework;Direct supervision/assist for medications management   Equipment  Recommendations  None recommended by PT    Recommendations for Other Services       Precautions / Restrictions Precautions Precautions: Fall Precaution Comments: weak L quad set, premedicate for pain Required Braces or Orthoses: Knee Immobilizer - Left Knee Immobilizer - Left:  (can't perform SLR) Restrictions Weight Bearing Restrictions: Yes LLE Weight Bearing: Weight bearing as tolerated     Mobility  Bed Mobility Overal bed mobility: Needs Assistance Bed Mobility: Supine to Sit     Supine to sit: HOB elevated, Min guard     General bed mobility comments: min guard for safety, pt able to self-mobilize with use of gait belt as leg lifter    Transfers Overall transfer level: Needs assistance Equipment used: Rolling walker (2 wheels) Transfers: Sit to/from Stand Sit to Stand: Min guard           General transfer comment: min guard from EOB at lowest height. light cues for safe hand placement    Ambulation/Gait Ambulation/Gait assistance: Min guard Gait Distance (Feet): 60 Feet Assistive device: Rolling walker (2 wheels) Gait Pattern/deviations: Step-to pattern Gait velocity: decr     General Gait Details: slow guarded gait, cues for keeping RW in contact with ground and not lifting, fair recall of sequencing light cues at start, cues throughout to elevate trunk as pt with tendency for anterior flexed posture   Stairs             Wheelchair Mobility    Modified Rankin (Stroke Patients Only)       Balance Overall balance assessment: Needs assistance Sitting-balance support: Feet supported Sitting balance-Leahy Scale: Good     Standing balance support: Bilateral upper extremity supported, During functional activity, Reliant on assistive device for balance Standing balance-Leahy Scale:  Fair Standing balance comment: needs BUE support of RW for gait                            Cognition Arousal/Alertness: Awake/alert Behavior During  Therapy: WFL for tasks assessed/performed, Anxious Overall Cognitive Status: Within Functional Limits for tasks assessed                                          Exercises Total Joint Exercises Quad Sets: AROM, Left, Supine, 5 reps Heel Slides: AROM, Left, 5 reps Hip ABduction/ADduction: AAROM, Left, Supine, 5 reps Straight Leg Raises: AAROM, Left, Supine, 5 reps Knee Flexion:  (not well tolerated, ~90* only seated EOB)    General Comments        Pertinent Vitals/Pain Pain Assessment Pain Assessment: Faces Faces Pain Scale: Hurts little more Pain Location: L knee Pain Descriptors / Indicators: Discomfort, Sore, Grimacing, Guarding Pain Intervention(s): Limited activity within patient's tolerance, Monitored during session, RN gave pain meds during session, Repositioned    Home Living                          Prior Function            PT Goals (current goals can now be found in the care plan section) Acute Rehab PT Goals Patient Stated Goal: to go to rehab PT Goal Formulation: With patient Time For Goal Achievement: 09/20/21    Frequency    BID      PT Plan Current plan remains appropriate    Co-evaluation              AM-PAC PT "6 Clicks" Mobility   Outcome Measure  Help needed turning from your back to your side while in a flat bed without using bedrails?: A Lot Help needed moving from lying on your back to sitting on the side of a flat bed without using bedrails?: A Little Help needed moving to and from a bed to a chair (including a wheelchair)?: A Little Help needed standing up from a chair using your arms (e.g., wheelchair or bedside chair)?: A Little Help needed to walk in hospital room?: A Little Help needed climbing 3-5 steps with a railing? : Total 6 Click Score: 15    End of Session Equipment Utilized During Treatment: Gait belt Activity Tolerance: Patient tolerated treatment well Patient left: with call  bell/phone within reach;in chair (CPM placed, iceman resting open over LLE, NT notified he wants a bath) Nurse Communication: Mobility status;Other (comment) (pt wants a bath) PT Visit Diagnosis: Other abnormalities of gait and mobility (R26.89);Muscle weakness (generalized) (M62.81);Difficulty in walking, not elsewhere classified (R26.2);Pain Pain - Right/Left: Left Pain - part of body: Knee     Time: 4315-4008 PT Time Calculation (min) (ACUTE ONLY): 30 min  Charges:  $Gait Training: 8-22 mins $Therapeutic Exercise: 8-22 mins                     Ernest Navarro R. PTA Acute Rehabilitation Services Office: [phone]   Catalina Antigua 09/08/2021, 10:03 AM

## 2021-09-08 NOTE — Progress Notes (Signed)
Orthopedic Tech Progress Note Patient Details:  Ernest Navarro March 02, 1961 [id number]  Ortho Devices Type of Ortho Device: Bone foam zero knee Ortho Device/Splint Interventions: Ordered   Post Interventions Patient Tolerated: Well Instructions Provided: Care of device  Ernest Navarro [name] 09/08/2021, 11:34 AM

## 2021-09-08 NOTE — Plan of Care (Signed)

## 2021-09-08 NOTE — Progress Notes (Signed)
Physical Therapy Treatment Patient Details Name: Ernest Navarro. MRN: [id number] DOB: 12-19-1960 Today's Date: 09/08/2021   History of Present Illness Patient is a 61 year old male s/p L TKA. PMH includes: obesity, HTN, OA, gout R TKA 10/28/20.    PT Comments    Pt received OOB in recliner on arrival having tolerated since AM session. Pt eager to mobilize and motivated showing good progress towards acute goals. Pt with increased tolerance for ambulation distance this session with improved gait mechanics progressing from step to gait pattern to step through pattern. Pt with improved quad firing during quad sets this session but continues to be unable to actively complete SLR. Pt demonstrating all bed mobility and transfers at min guard level for safety, with good use of compensatory strategies of utilizing gait belt as leg lifter. Current plan remains appropriate to address deficits and maximize functional independence and decrease caregiver burden. Pt continues to benefit from skilled PT services to progress toward functional mobility goals.    Recommendations for follow up therapy are one component of a multi-disciplinary discharge planning process, led by the attending physician.  Recommendations may be updated based on patient status, additional functional criteria and insurance authorization.  Follow Up Recommendations  Skilled nursing-short term rehab (<3 hours/day)     Assistance Recommended at Discharge Frequent or constant Supervision/Assistance  Patient can return home with the following A lot of help with walking and/or transfers;A lot of help with bathing/dressing/bathroom;Help with stairs or ramp for entrance;Assist for transportation;Assistance with cooking/housework;Direct supervision/assist for medications management   Equipment Recommendations  None recommended by PT    Recommendations for Other Services       Precautions / Restrictions Precautions Precautions:  Fall Precaution Comments: weak L quad set, premedicate for pain Required Braces or Orthoses: Knee Immobilizer - Left Knee Immobilizer - Left:  (can't perform SLR) Restrictions Weight Bearing Restrictions: Yes LLE Weight Bearing: Weight bearing as tolerated     Mobility  Bed Mobility Overal bed mobility: Needs Assistance Bed Mobility: Sit to Supine     Supine to sit: HOB elevated, Min guard Sit to supine: Min guard   General bed mobility comments: min guard for safety, pt able to bring LLE into bed with gait belt use as limb lifter    Transfers Overall transfer level: Needs assistance Equipment used: Rolling walker (2 wheels) Transfers: Sit to/from Stand Sit to Stand: Min guard           General transfer comment: min guard from recliner and BSC    Ambulation/Gait Ambulation/Gait assistance: Min guard Gait Distance (Feet): 100 Feet Assistive device: Rolling walker (2 wheels) Gait Pattern/deviations: Step-to pattern, Step-through pattern Gait velocity: decr     General Gait Details: slow guarded gait good recall of sequencing cues throughout to elevate trunk as pt with tendency for anterior flexed posture step to progressing to step through   Praxair Mobility    Modified Rankin (Stroke Patients Only)       Balance Overall balance assessment: Needs assistance Sitting-balance support: Feet supported Sitting balance-Leahy Scale: Good     Standing balance support: Bilateral upper extremity supported, During functional activity, Reliant on assistive device for balance Standing balance-Leahy Scale: Fair Standing balance comment: needs BUE support of RW for gait  Cognition Arousal/Alertness: Awake/alert Behavior During Therapy: WFL for tasks assessed/performed, Anxious Overall Cognitive Status: Within Functional Limits for tasks assessed                                           Exercises Total Joint Exercises Quad Sets: AROM, Left, Supine, 5 reps Heel Slides: AROM, Left, 5 reps Hip ABduction/ADduction: AAROM, Left, Supine, 5 reps Straight Leg Raises: AAROM, Left, Supine, 5 reps Knee Flexion:  (not well tolerated, ~90* only seated EOB)    General Comments        Pertinent Vitals/Pain Pain Assessment Pain Assessment: Faces Faces Pain Scale: Hurts little more Pain Location: L knee Pain Descriptors / Indicators: Discomfort, Sore, Grimacing, Guarding Pain Intervention(s): Premedicated before session, Monitored during session, Limited activity within patient's tolerance, Repositioned    Home Living                          Prior Function            PT Goals (current goals can now be found in the care plan section) Acute Rehab PT Goals Patient Stated Goal: to go to rehab PT Goal Formulation: With patient Time For Goal Achievement: 09/20/21    Frequency    BID      PT Plan Current plan remains appropriate    Co-evaluation              AM-PAC PT "6 Clicks" Mobility   Outcome Measure  Help needed turning from your back to your side while in a flat bed without using bedrails?: A Lot Help needed moving from lying on your back to sitting on the side of a flat bed without using bedrails?: A Little Help needed moving to and from a bed to a chair (including a wheelchair)?: A Little Help needed standing up from a chair using your arms (e.g., wheelchair or bedside chair)?: A Little Help needed to walk in hospital room?: A Little Help needed climbing 3-5 steps with a railing? : Total 6 Click Score: 15    End of Session Equipment Utilized During Treatment: Gait belt Activity Tolerance: Patient tolerated treatment well Patient left: with call bell/phone within reach;in bed;with bed alarm set;Other (comment) (with TOC team present) Nurse Communication: Mobility status PT Visit Diagnosis: Other abnormalities of gait and mobility  (R26.89);Muscle weakness (generalized) (M62.81);Difficulty in walking, not elsewhere classified (R26.2);Pain Pain - Right/Left: Left Pain - part of body: Knee     Time: 2993-7169 PT Time Calculation (min) (ACUTE ONLY): 25 min  Charges:  $Gait Training: 8-22 mins $Therapeutic Exercise: 8-22 mins                     Jerren Flinchbaugh R. PTA Acute Rehabilitation Services Office: [phone]    Catalina Antigua 09/08/2021, 3:35 PM

## 2021-09-08 NOTE — Discharge Summary (Addendum)
Physician Discharge Summary      Patient ID: Ernest Navarro. MRN: [id number] DOB/AGE: [birth year] 61 y.o.  Admit date: 09/05/2021 Discharge date: 09/09/2021  Admission Diagnoses:  Principal Problem:   S/P total knee arthroplasty, left   Discharge Diagnoses:  Same  Surgeries: Procedure(s): LEFT TOTAL KNEE ARTHROPLASTY on 09/05/2021   Consultants:   Discharged Condition: Stable  Hospital Course: Ernest Navarro. is an 61 y.o. male who was admitted 09/05/2021 with a chief complaint of left knee pain, and found to have a diagnosis of left knee osteoarthritis.  They were brought to the operating room on 09/05/2021 and underwent the above named procedures.  Pt awoke from anesthesia without complication and was transferred to the floor. On POD1, patient's pain was overall controlled. He initially struggled with mobility but steadily improved throughout his stay. No chest pain, SOB, abd pain.  He was discharged to SNF on 09/09/21 and pt will f/u with Dr. Marlou Sa in clinic in ~2 weeks.   Antibiotics given:  Anti-infectives (From admission, onward)    Start     Dose/Rate Route Frequency Ordered Stop   09/05/21 1730  ceFAZolin (ANCEF) IVPB 2g/100 mL premix        2 g 200 mL/hr over 30 Minutes Intravenous Every 8 hours 09/05/21 1637 09/06/21 0010   09/05/21 1730  ceFAZolin (ANCEF) IVPB 2g/100 mL premix  Status:  Discontinued        2 g 200 mL/hr over 30 Minutes Intravenous Every 8 hours 09/05/21 1637 09/05/21 1652   09/05/21 1400  vancomycin (VANCOCIN) powder  Status:  Discontinued          As needed 09/05/21 1400 09/05/21 1511   09/05/21 1020  ceFAZolin (ANCEF) 2-4 GM/100ML-% IVPB       Note to Pharmacy: Humberto Leep O: cabinet override      09/05/21 1020 09/05/21 2229   09/05/21 1015  ceFAZolin (ANCEF) IVPB 3g/100 mL premix        3 g 200 mL/hr over 30 Minutes Intravenous On call to O.R. 09/05/21 1010 09/05/21 1242     .  Recent vital signs:  Vitals:   09/07/21 2048 09/08/21 0756   BP: (!) 161/97 (!) 156/86  Pulse: (!) 105 94  Resp: 19 16  Temp: 97.9 F (36.6 C) 98.2 F (36.8 C)  SpO2: 91% 92%    Recent laboratory studies:  Results for orders placed or performed during the hospital encounter of 09/05/21  Glucose, capillary  Result Value Ref Range   Glucose-Capillary 160 (H) 70 - 99 mg/dL  Glucose, capillary  Result Value Ref Range   Glucose-Capillary 155 (H) 70 - 99 mg/dL  CBC  Result Value Ref Range   WBC 13.8 (H) 4.0 - 10.5 K/uL   RBC 5.73 4.22 - 5.81 MIL/uL   Hemoglobin 14.2 13.0 - 17.0 g/dL   HCT 45.8 39.0 - 52.0 %   MCV 79.9 (L) 80.0 - 100.0 fL   MCH 24.8 (L) 26.0 - 34.0 pg   MCHC 31.0 30.0 - 36.0 g/dL   RDW 15.9 (H) 11.5 - 15.5 %   Platelets 286 150 - 400 K/uL   nRBC 0.0 0.0 - 0.2 %  Glucose, capillary  Result Value Ref Range   Glucose-Capillary 166 (H) 70 - 99 mg/dL  Glucose, capillary  Result Value Ref Range   Glucose-Capillary 217 (H) 70 - 99 mg/dL  CBC  Result Value Ref Range   WBC 15.8 (H) 4.0 - 10.5 K/uL   RBC  5.71 4.22 - 5.81 MIL/uL   Hemoglobin 13.8 13.0 - 17.0 g/dL   HCT 46.6 39.0 - 52.0 %   MCV 81.6 80.0 - 100.0 fL   MCH 24.2 (L) 26.0 - 34.0 pg   MCHC 29.6 (L) 30.0 - 36.0 g/dL   RDW 16.3 (H) 11.5 - 15.5 %   Platelets 284 150 - 400 K/uL   nRBC 0.0 0.0 - 0.2 %  Glucose, capillary  Result Value Ref Range   Glucose-Capillary 175 (H) 70 - 99 mg/dL  Glucose, capillary  Result Value Ref Range   Glucose-Capillary 371 (H) 70 - 99 mg/dL  Glucose, capillary  Result Value Ref Range   Glucose-Capillary 174 (H) 70 - 99 mg/dL  Glucose, capillary  Result Value Ref Range   Glucose-Capillary 141 (H) 70 - 99 mg/dL  Glucose, capillary  Result Value Ref Range   Glucose-Capillary 135 (H) 70 - 99 mg/dL  Glucose, capillary  Result Value Ref Range   Glucose-Capillary 170 (H) 70 - 99 mg/dL  Glucose, capillary  Result Value Ref Range   Glucose-Capillary 174 (H) 70 - 99 mg/dL  Glucose, capillary  Result Value Ref Range    Glucose-Capillary 119 (H) 70 - 99 mg/dL    Discharge Medications:   Allergies as of 09/09/2021       Reactions   Oxycodone Nausea Only   Makes him "delirious"        Medication List     STOP taking these medications    ibuprofen 200 MG tablet Commonly known as: ADVIL   traMADol 50 MG tablet Commonly known as: Ultram       TAKE these medications    acetaminophen 500 MG tablet Commonly known as: TYLENOL Take 1,000 mg by mouth every 6 (six) hours as needed for moderate pain.   amLODipine 10 MG tablet Commonly known as: NORVASC Take 10 mg by mouth daily.   aspirin 81 MG chewable tablet Chew 1 tablet (81 mg total) by mouth 2 (two) times daily.   atorvastatin 20 MG tablet Commonly known as: LIPITOR Take 20 mg by mouth every morning.   celecoxib 100 MG capsule Commonly known as: CELEBREX Take 1 capsule (100 mg total) by mouth 2 (two) times daily.   docusate sodium 100 MG capsule Commonly known as: COLACE Take 1 capsule (100 mg total) by mouth 2 (two) times daily.   gabapentin 300 MG capsule Commonly known as: NEURONTIN Take 1 capsule (300 mg total) by mouth 3 (three) times daily.   HYDROmorphone 2 MG tablet Commonly known as: DILAUDID Take 1-2 tablets (2-4 mg total) by mouth every 4 (four) hours.   insulin aspart 100 UNIT/ML injection Commonly known as: novoLOG Inject 0-20 Units into the skin 3 (three) times daily with meals.   insulin aspart 100 UNIT/ML injection Commonly known as: novoLOG Inject 4 Units into the skin 3 (three) times daily with meals.   insulin glargine-yfgn 100 UNIT/ML injection Commonly known as: SEMGLEE Inject 0.14 mLs (14 Units total) into the skin daily. Start taking on: September 10, 2021   methocarbamol 500 MG tablet Commonly known as: ROBAXIN Take 1 tablet (500 mg total) by mouth every 8 (eight) hours as needed for muscle spasms.        Diagnostic Studies: No results found.  Disposition:        Signed: Donella Stade 09/08/2021, 3:28 PM

## 2021-09-08 NOTE — Progress Notes (Signed)
  Subjective: Ernest Navarro. is a 61 y.o. male s/p left TKA.  They are POD3.  Pt's pain is controlled but moderate.  Pt denies numbness/tingling/weakness.  Pt has ambulated with moderate difficulty.  Did have some dizziness and lightheadedness earlier today when ambulating with PT so does not feel ready for discharge. No chest pain, SOB, abd pain, fevers, chills   Objective: Vital signs in last 24 hours: Temp:  [97.9 F (36.6 C)-98.8 F (37.1 C)] 98.2 F (36.8 C) (06/08 0756) Pulse Rate:  [94-110] 94 (06/08 0756) Resp:  [16-20] 16 (06/08 0756) BP: (155-161)/(86-97) 156/86 (06/08 0756) SpO2:  [91 %-96 %] 92 % (06/08 0756)  Intake/Output from previous day: 06/07 0701 - 06/08 0700 In: 720 [P.O.:720] Out: -  Intake/Output this shift: Total I/O In: 240 [P.O.:240] Out: 550 [Urine:550]  Exam:  No gross blood or drainage overlying the dressing 2+ DP pulse Sensation intact distally in the left foot Able to dorsiflex and plantarflex the left foot No calf tenderness, negative homan sign Able to perform weak straight leg raise with some extensor lag   Labs: Recent Labs    09/06/21 0224 09/07/21 0320  HGB 14.2 13.8   Recent Labs    09/06/21 0224 09/07/21 0320  WBC 13.8* 15.8*  RBC 5.73 5.71  HCT 45.8 46.6  PLT 286 284   No results for input(s): "NA", "K", "CL", "CO2", "BUN", "CREATININE", "GLUCOSE", "CALCIUM" in the last 72 hours. No results for input(s): "LABPT", "INR" in the last 72 hours.  Assessment/Plan: Pt is POD3 s/p left TKA.    -Plan to discharge to SNF tomorrow pending patient's pain and PT eval. Mostly having difficulty with dizziness. Anticipate this will improve continually. Recheck tomorrow morning with likely discharge at that point  -WBAT with a walker  -Authorization accepted for discharge to [facility].      Tyashia Morrisette L Maebelle Sulton 09/08/2021, 3:30 PM

## 2021-09-08 NOTE — TOC Progression Note (Addendum)
Transition of Care (TOC) - Progression Note    Patient Details  Name: Ernest Navarro. MRN: [id number] Date of Birth: [birth year]  Transition of Care [facility]) CM/SW Contact  Lorri Frederick, LCSW Phone Number: 09/08/2021, 11:57 AM  Clinical Narrative:   Per [facility], auth still pending.    1500: Message from [place], [facility] has been approved.  MD/PA notified, they would like to hold DC until tomorrow.  Talked with pt and he has arranged with brother to transport tomorrow.    Expected Discharge Plan: Skilled Nursing Facility Barriers to Discharge: Continued Medical Work up  Expected Discharge Plan and Services Expected Discharge Plan: Skilled Nursing Facility   Discharge Planning Services: CM Consult   Living arrangements for the past 2 months: Single Family Home                                       Social Determinants of Health (SDOH) Interventions    Readmission Risk Interventions     No data to display

## 2021-09-09 ENCOUNTER — Other Ambulatory Visit: Payer: Self-pay | Admitting: Surgical

## 2021-09-09 DIAGNOSIS — M1712 Unilateral primary osteoarthritis, left knee: Secondary | ICD-10-CM | POA: Diagnosis not present

## 2021-09-09 LAB — GLUCOSE, CAPILLARY
Glucose-Capillary: 166 mg/dL — ABNORMAL HIGH (ref 70–99)
Glucose-Capillary: 114 mg/dL — ABNORMAL HIGH (ref 70–99)

## 2021-09-09 MED ORDER — INSULIN ASPART 100 UNIT/ML IJ SOLN: 0.0000 [IU] | Freq: Three times a day (TID) | INTRAMUSCULAR | 11 refills | Status: DC

## 2021-09-09 MED ORDER — DOCUSATE SODIUM 100 MG PO CAPS: 100.0000 mg | ORAL_CAPSULE | Freq: Two times a day (BID) | ORAL | 0 refills | Status: DC

## 2021-09-09 MED ORDER — ASPIRIN 81 MG PO CHEW: 81.0000 mg | CHEWABLE_TABLET | Freq: Two times a day (BID) | ORAL | 0 refills | Status: DC

## 2021-09-09 MED ORDER — INSULIN GLARGINE-YFGN 100 UNIT/ML ~~LOC~~ SOLN: 14.0000 [IU] | Freq: Every day | SUBCUTANEOUS | 11 refills | Status: DC

## 2021-09-09 MED ORDER — INSULIN ASPART 100 UNIT/ML IJ SOLN: 4.0000 [IU] | Freq: Three times a day (TID) | INTRAMUSCULAR | 11 refills | Status: DC

## 2021-09-09 MED ORDER — HYDROMORPHONE HCL 2 MG PO TABS: 2.0000 mg | ORAL_TABLET | ORAL | 0 refills | Status: DC

## 2021-09-09 MED ORDER — GABAPENTIN 300 MG PO CAPS: 300.0000 mg | ORAL_CAPSULE | Freq: Three times a day (TID) | ORAL | 0 refills | Status: DC

## 2021-09-09 MED ORDER — CELECOXIB 100 MG PO CAPS: 100.0000 mg | ORAL_CAPSULE | Freq: Two times a day (BID) | ORAL | 0 refills | Status: DC

## 2021-09-09 NOTE — TOC Transition Note (Signed)
Transition of Care [facility]) - CM/SW Discharge Note   Patient Details  Name: Kingslee Zales. MRN: [id number] Date of Birth: 12/14/1960  Transition of Care [facility]) CM/SW Contact:  Joanne Chars, LCSW Phone Number: 09/09/2021, 10:04 AM   Clinical Narrative:   Pt discharging to [place] [facility].  RN call report to [phone].   Pt reports he has arranged private transportation with a wheelchair Lucianne Lei and he has called them to schedule his pickup.      Final next level of care: Skilled Nursing Facility Barriers to Discharge: Barriers Resolved   Patient Goals and CMS Choice   CMS Medicare.gov Compare Post Acute Care list provided to:: Patient    Discharge Placement              Patient chooses bed at:  [facility]) Patient to be transferred to facility by: Pt has arranged private transportation on his own Name of family member notified: left message with brother Ovid Curd Patient and family notified of of transfer: 09/09/21  Discharge Plan and Services   Discharge Planning Services: CM Consult                                 Social Determinants of Health (SDOH) Interventions     Readmission Risk Interventions     No data to display

## 2021-09-09 NOTE — Plan of Care (Signed)

## 2021-09-09 NOTE — Progress Notes (Signed)
Physical Therapy Treatment Patient Details Name: Ernest Navarro. MRN: [id number] DOB: 08-17-1960 Today's Date: 09/09/2021   History of Present Illness Patient is a 61 year old male s/p L TKA. PMH includes: obesity, HTN, OA, gout R TKA 10/28/20.    PT Comments    Pt received supine and agreeable to session with continued progress. Pt supervision for all bed mobility and min guard for transfers for safety, with good hand placement and steady rise. Pt continues to demonstrate flexed posture on rise from sitting and throughout ambulation with ability to correct with cues. Pt with increased tolerance for ambulation with min guard assist but continues to be limited by pain and quick to fatigue with heavy reliance of BUE on RW throughout to maintain balance. Current plan remains appropriate to address deficits and maximize functional independence and decrease caregiver burden. Pt continues to benefit from skilled PT services to progress toward functional mobility goals.    Recommendations for follow up therapy are one component of a multi-disciplinary discharge planning process, led by the attending physician.  Recommendations may be updated based on patient status, additional functional criteria and insurance authorization.  Follow Up Recommendations  Skilled nursing-short term rehab (<3 hours/day)     Assistance Recommended at Discharge Frequent or constant Supervision/Assistance  Patient can return home with the following A lot of help with walking and/or transfers;A lot of help with bathing/dressing/bathroom;Help with stairs or ramp for entrance;Assist for transportation;Assistance with cooking/housework;Direct supervision/assist for medications management   Equipment Recommendations  None recommended by PT    Recommendations for Other Services       Precautions / Restrictions Precautions Precautions: Fall Precaution Comments: weak L quad set, premedicate for pain Required Braces or  Orthoses: Knee Immobilizer - Left Knee Immobilizer - Left:  (can't perform SLR) Restrictions Weight Bearing Restrictions: Yes LLE Weight Bearing: Weight bearing as tolerated     Mobility  Bed Mobility Overal bed mobility: Needs Assistance Bed Mobility: Supine to Sit     Supine to sit: Supervision          Transfers Overall transfer level: Needs assistance Equipment used: Rolling walker (2 wheels) Transfers: Sit to/from Stand Sit to Stand: Min guard           General transfer comment: min guard from recliner and BSC    Ambulation/Gait Ambulation/Gait assistance: Min guard Gait Distance (Feet): 100 Feet (x2) Assistive device: Rolling walker (2 wheels) Gait Pattern/deviations: Step-to pattern, Step-through pattern Gait velocity: decr     General Gait Details: slow guarded gait with good recall of sequencing, pt able to correct flexed posture during standing rest break, good heel strike   Stairs             Wheelchair Mobility    Modified Rankin (Stroke Patients Only)       Balance Overall balance assessment: Needs assistance Sitting-balance support: Feet supported Sitting balance-Leahy Scale: Good     Standing balance support: Bilateral upper extremity supported, During functional activity, Reliant on assistive device for balance Standing balance-Leahy Scale: Fair Standing balance comment: needs BUE support of RW for gait                            Cognition Arousal/Alertness: Awake/alert Behavior During Therapy: WFL for tasks assessed/performed, Anxious Overall Cognitive Status: Within Functional Limits for tasks assessed  Exercises Total Joint Exercises Quad Sets: AROM, Left, Supine, 5 reps Straight Leg Raises: AAROM, Left, Supine, 5 reps Long Arc Quad: AAROM, Left, 5 reps, Seated Knee Flexion: AAROM, 15 reps, Seated    General Comments        Pertinent  Vitals/Pain Pain Assessment Pain Assessment: Faces Faces Pain Scale: Hurts little more Pain Location: L knee Pain Descriptors / Indicators: Discomfort, Sore, Grimacing, Guarding Pain Intervention(s): Monitored during session, Limited activity within patient's tolerance, Repositioned, Ice applied    Home Living                          Prior Function            PT Goals (current goals can now be found in the care plan section) Acute Rehab PT Goals Patient Stated Goal: to go to rehab PT Goal Formulation: With patient Time For Goal Achievement: 09/20/21    Frequency    BID      PT Plan Current plan remains appropriate    Co-evaluation              AM-PAC PT "6 Clicks" Mobility   Outcome Measure  Help needed turning from your back to your side while in a flat bed without using bedrails?: A Lot Help needed moving from lying on your back to sitting on the side of a flat bed without using bedrails?: A Little Help needed moving to and from a bed to a chair (including a wheelchair)?: A Little Help needed standing up from a chair using your arms (e.g., wheelchair or bedside chair)?: A Little Help needed to walk in hospital room?: A Little Help needed climbing 3-5 steps with a railing? : Total 6 Click Score: 15    End of Session Equipment Utilized During Treatment: Gait belt Activity Tolerance: Patient tolerated treatment well Patient left: in chair;with call bell/phone within reach Nurse Communication: Mobility status PT Visit Diagnosis: Other abnormalities of gait and mobility (R26.89);Muscle weakness (generalized) (M62.81);Difficulty in walking, not elsewhere classified (R26.2);Pain Pain - Right/Left: Left Pain - part of body: Knee     Time: 6789-3810 PT Time Calculation (min) (ACUTE ONLY): 38 min  Charges:  $Gait Training: 23-37 mins $Therapeutic Exercise: 8-22 mins                     Jaymien Landin R. PTA Acute Rehabilitation Services Office:  [phone]    Catalina Antigua 09/09/2021, 9:39 AM

## 2021-09-12 NOTE — Telephone Encounter (Signed)
I only sent this medication in so it would show up on discharge summary for continued insulin use in SNF. Okay to discontinue this but he should probably discuss his DM with his PCP given his increased A1c recently and elevated blood glucose in the hospital. Dont see that he is typically on any medication for DM according to his chart

## 2021-09-16 ENCOUNTER — Telehealth: Payer: Self-pay | Admitting: Orthopedic Surgery

## 2021-09-16 NOTE — Telephone Encounter (Signed)
I called and spoke with Ernest Navarro.  Everything is been sorted out regarding the issues with his skilled nursing facility stay.  He will come in on Monday and we can talk about how his knee is doing.

## 2021-09-16 NOTE — Telephone Encounter (Signed)
Pt called requesting a call back from Dr. August Saucer. Pt states he had surgery last week and has additional questions. Please call pt at [phone].

## 2021-09-19 ENCOUNTER — Encounter: Payer: Self-pay | Admitting: Surgical

## 2021-09-19 ENCOUNTER — Ambulatory Visit (INDEPENDENT_AMBULATORY_CARE_PROVIDER_SITE_OTHER): Payer: BC Managed Care – PPO | Admitting: Surgical

## 2021-09-19 ENCOUNTER — Ambulatory Visit (INDEPENDENT_AMBULATORY_CARE_PROVIDER_SITE_OTHER): Payer: BC Managed Care – PPO

## 2021-09-19 DIAGNOSIS — Z96652 Presence of left artificial knee joint: Secondary | ICD-10-CM

## 2021-09-19 NOTE — Progress Notes (Signed)
Post-Op Visit Note   Patient: Ernest Navarro.           Date of Birth: Mar 15, 1961           MRN: [id number] Visit Date: 09/19/2021 PCP: Clinic, General Medical   Assessment & Plan:  Chief Complaint:  Chief Complaint  Patient presents with   Left Knee - Routine Post Op   Visit Diagnoses:  1. S/P total knee arthroplasty, left     Plan: Ernest Navarro. is a 61 y.o. male who presents s/p left total knee arthroplasty on 09/05/2021.  Doing well overall.  Currently staying at [facility] skilled nursing facility.   They deny any persistent calf pain, shortness of breath, chest pain, abdominal pain.  Pain is overall controlled and taking Dilaudid for pain control.  Taking aspirin for DVT prophylaxis.  Ambulating with wheelchair and walker.   On exam, patient has range of motion 0 degrees extension to 85 degrees of knee flexion.  Incision is healing well without evidence of infection or dehiscence.  2+ DP pulse of the operative extremity.  No calf tenderness, negative Homans' sign.  Able to perform straight leg raise with great effort for 1 repetition; his quad is firing but it is contracting in a somewhat sporadic manner.  Intact ankle dorsiflexion.  Plan is continue with physical therapy at skilled nursing facility.  He has 15 stairs to get into his home and lives at home by himself so needs to continue with rehab at the SNF before he will be safe to return home.  He did stairs on Friday but was only able to a send about 2-3 stairs with great difficulty.  Still having some difficulty with performing straight leg raise and his quad is firing but firing sporadically on exam today.  He has not really worked on any quad strengthening yet according to him so a note was made to focus on this for therapy at the SNF and he will return in 2 weeks for reevaluation prior to being discharged from the SNF.  Return on 10/03/2021..    Follow-Up Instructions: No follow-ups on file.   Orders:  Orders Placed  This Encounter  Procedures   XR Knee 1-2 Views Left   No orders of the defined types were placed in this encounter.   Imaging: No results found.  PMFS History: Patient Active Problem List   Diagnosis Date Noted   S/P total knee arthroplasty, left 09/05/2021   Arthritis of right knee    S/P total knee arthroplasty, right 10/28/2020   Obesity 06/29/2015   Obstructive sleep apnea 05/06/2015   Seasonal and perennial allergic rhinitis 05/06/2015   Hyperglycemia 06/19/2012   Essential hypertension, benign 06/19/2012   Gout 06/19/2012   Nocturia 06/19/2012   Past Medical History:  Diagnosis Date   Arthritis    bilateral knees   Essential hypertension, benign 06/19/2012   Gout 06/19/2012   Headache    Hyperglycemia 06/19/2012   March 2014 A1c 6.5 (repeat needed)    Pneumonia    Pre-diabetes    Sleep apnea     Family History  Problem Relation Age of Onset   Colon cancer Neg Hx     Past Surgical History:  Procedure Laterality Date   KNEE SURGERY Right    x3 arthroscopies   TOTAL KNEE ARTHROPLASTY Right 10/28/2020   Procedure: RIGHT TOTAL KNEE ARTHROPLASTY;  Surgeon: Cammy Copa, MD;  Location: [facility] OR;  Service: Orthopedics;  Laterality: Right;  TOTAL KNEE ARTHROPLASTY Left 09/05/2021   Procedure: LEFT TOTAL KNEE ARTHROPLASTY;  Surgeon: Cammy Copa, MD;  Location: [facility] OR;  Service: Orthopedics;  Laterality: Left;   Social History   Occupational History   Not on file  Tobacco Use   Smoking status: Never   Smokeless tobacco: Never  Vaping Use   Vaping Use: Never used  Substance and Sexual Activity   Alcohol use: No    Alcohol/week: 0.0 standard drinks of alcohol   Drug use: No   Sexual activity: Not on file

## 2021-09-26 ENCOUNTER — Telehealth: Payer: Self-pay | Admitting: Orthopedic Surgery

## 2021-10-04 DIAGNOSIS — M1712 Unilateral primary osteoarthritis, left knee: Secondary | ICD-10-CM

## 2021-10-05 ENCOUNTER — Ambulatory Visit (INDEPENDENT_AMBULATORY_CARE_PROVIDER_SITE_OTHER): Payer: BC Managed Care – PPO | Admitting: Orthopedic Surgery

## 2021-10-05 DIAGNOSIS — Z96652 Presence of left artificial knee joint: Secondary | ICD-10-CM

## 2021-10-06 ENCOUNTER — Encounter: Payer: Self-pay | Admitting: Orthopedic Surgery

## 2021-10-06 NOTE — Progress Notes (Signed)
   Post-Op Visit Note   Patient: Ernest Navarro.           Date of Birth: 28-Jul-1960           MRN: [id number] Visit Date: 10/05/2021 PCP: Clinic, General Medical   Assessment & Plan:  Chief Complaint:  Chief Complaint  Patient presents with   Left Knee - Routine Post Op   Visit Diagnoses:  1. S/P total knee arthroplasty, left     Plan: Ernest Navarro is a 61 year old patient who underwent left total knee replacement a month ago.  Has 3 out of 10 pain at rest and with exercise sometimes the pain goes up to 7 out of 10.  He works from home.  May be able to return to work 11/21/2021 but I would like to see him back before making that final decision.  On examination today he has no calf tenderness and negative Homans.  Range of motion 0-87.  Still taking Dilaudid 2 mg every 4 hours but I strongly encouraged him to start tapering.  He did do that with his other knee replacement as well and was able to get off the pain medicine.  Plan at this time is to start physical therapy 2-3 times a week for 8 weeks to really work more on range of motion as opposed to strengthening.  2-week return and we can decide definitively about return to work date at that time.  Follow-Up Instructions: Return in about 2 weeks (around 10/19/2021).   Orders:  Orders Placed This Encounter  Procedures   Ambulatory referral to Physical Therapy   No orders of the defined types were placed in this encounter.   Imaging: No results found.  PMFS History: 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   Arthritis of right knee    S/P total knee arthroplasty, right 10/28/2020   Obesity 06/29/2015   Obstructive sleep apnea 05/06/2015   Seasonal and perennial allergic rhinitis 05/06/2015   Hyperglycemia 06/19/2012   Essential hypertension, benign 06/19/2012   Gout 06/19/2012   Nocturia 06/19/2012   Past Medical History:  Diagnosis Date    Arthritis    bilateral knees   Essential hypertension, benign 06/19/2012   Gout 06/19/2012   Headache    Hyperglycemia 06/19/2012   March 2014 A1c 6.5 (repeat needed)    Pneumonia    Pre-diabetes    Sleep apnea     Family History  Problem Relation Age of Onset   Colon cancer Neg Hx     Past Surgical History:  Procedure Laterality Date   KNEE SURGERY Right    x3 arthroscopies   TOTAL KNEE ARTHROPLASTY Right 10/28/2020   Procedure: RIGHT TOTAL KNEE ARTHROPLASTY;  Surgeon: Cammy Copa, MD;  Location: [facility] OR;  Service: Orthopedics;  Laterality: Right;   TOTAL KNEE ARTHROPLASTY Left 09/05/2021   Procedure: LEFT TOTAL KNEE ARTHROPLASTY;  Surgeon: Cammy Copa, MD;  Location: [facility] OR;  Service: Orthopedics;  Laterality: Left;   Social History   Occupational History   Not on file  Tobacco Use   Smoking status: Never   Smokeless tobacco: Never  Vaping Use   Vaping Use: Never used  Substance and Sexual Activity   Alcohol use: No    Alcohol/week: 0.0 standard drinks of alcohol   Drug use: No   Sexual activity: Not on file

## 2021-10-07 ENCOUNTER — Telehealth: Payer: Self-pay | Admitting: Orthopedic Surgery

## 2021-10-07 ENCOUNTER — Other Ambulatory Visit: Payer: Self-pay | Admitting: Surgical

## 2021-10-07 MED ORDER — HYDROMORPHONE HCL 2 MG PO TABS: 1.0000 mg | ORAL_TABLET | Freq: Four times a day (QID) | ORAL | 0 refills | Status: DC | PRN

## 2021-10-07 NOTE — Telephone Encounter (Signed)
Sent in refill at lower frequency.  Okay to set up for CPM machine and refer to [facility] in [place]. I dont think he is eligible for HHPT given his being 4 weeks out from TKA

## 2021-10-07 NOTE — Telephone Encounter (Signed)
Pt is calling to get IN HOME care ASAP.  Please call the patient

## 2021-10-07 NOTE — Telephone Encounter (Signed)
Pt called requesting a refill of Dilaudid please send to [facility] on [address].aortic stenosis soon as possible. Pt also need a CPM machine for home. Facility did not not send him home with machine. And last pt is following up on referral to [facility] in [facility] sent from Dr. August Saucer. Please call pt at [phone].

## 2021-10-10 ENCOUNTER — Telehealth: Payer: Self-pay | Admitting: Orthopedic Surgery

## 2021-10-10 ENCOUNTER — Encounter: Payer: Self-pay | Admitting: Physical Therapy

## 2021-10-10 ENCOUNTER — Ambulatory Visit: Payer: BC Managed Care – PPO | Attending: Orthopedic Surgery | Admitting: Physical Therapy

## 2021-10-10 DIAGNOSIS — M25562 Pain in left knee: Secondary | ICD-10-CM | POA: Diagnosis not present

## 2021-10-10 DIAGNOSIS — Z96652 Presence of left artificial knee joint: Secondary | ICD-10-CM | POA: Insufficient documentation

## 2021-10-10 DIAGNOSIS — R262 Difficulty in walking, not elsewhere classified: Secondary | ICD-10-CM | POA: Insufficient documentation

## 2021-10-10 DIAGNOSIS — M6281 Muscle weakness (generalized): Secondary | ICD-10-CM | POA: Insufficient documentation

## 2021-10-10 DIAGNOSIS — M25662 Stiffness of left knee, not elsewhere classified: Secondary | ICD-10-CM | POA: Insufficient documentation

## 2021-10-10 NOTE — Telephone Encounter (Signed)
Pt returned call to Lauren F. Pt states he was in an appt. Pt is asking for Leotis Shames F. to call him back at [phone].

## 2021-10-10 NOTE — Therapy (Signed)
OUTPATIENT PHYSICAL THERAPY LOWER EXTREMITY EVALUATION   Patient Name: Ernest Navarro. MRN: [id number] DOB:02-Oct-1960, 61 y.o., male Today's Date: 10/10/2021   PT End of Session - 10/10/21 1034     Visit Number 1    Number of Visits 18    Date for PT Re-Evaluation 12/05/21    PT Start Time 1020    PT Stop Time 1100    PT Time Calculation (min) 40 min    Activity Tolerance Patient tolerated treatment well    Behavior During Therapy [facility] for tasks assessed/performed             Past Medical History:  Diagnosis Date   Arthritis    bilateral knees   Essential hypertension, benign 06/19/2012   Gout 06/19/2012   Headache    Hyperglycemia 06/19/2012   March 2014 A1c 6.5 (repeat needed)    Pneumonia    Pre-diabetes    Sleep apnea    Past Surgical History:  Procedure Laterality Date   KNEE SURGERY Right    x3 arthroscopies   TOTAL KNEE ARTHROPLASTY Right 10/28/2020   Procedure: RIGHT TOTAL KNEE ARTHROPLASTY;  Surgeon: Meredith Pel, MD;  Location: [place];  Service: Orthopedics;  Laterality: Right;   TOTAL KNEE ARTHROPLASTY Left 09/05/2021   Procedure: LEFT TOTAL KNEE ARTHROPLASTY;  Surgeon: Meredith Pel, MD;  Location: [place];  Service: Orthopedics;  Laterality: Left;   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   Arthritis of right knee    S/P total knee arthroplasty, right 10/28/2020   Obesity 06/29/2015   Obstructive sleep apnea 05/06/2015   Seasonal and perennial allergic rhinitis 05/06/2015   Hyperglycemia 06/19/2012   Essential hypertension, benign 06/19/2012   Gout 06/19/2012   Nocturia 06/19/2012    PCP: General medical clinic  REFERRING PROVIDER: Meredith Pel MD  REFERRING DIAG: [phone] (ICD-10-CM) - S/P total knee arthroplasty, left  THERAPY DIAG:  Acute pain of left knee - Plan: PT plan of care cert/re-cert  Stiffness of left knee, not elsewhere  classified - Plan: PT plan of care cert/re-cert  Muscle weakness (generalized) - Plan: PT plan of care cert/re-cert  Difficulty in walking, not elsewhere classified - Plan: PT plan of care cert/re-cert  Rationale for Evaluation and Treatment Rehabilitation  ONSET DATE: 09/05/2021  SUBJECTIVE:   SUBJECTIVE STATEMENT: Pt. Reports he was at [facility] for 22 days doing PT 5 days/week.  Got out last Thursday (10/06/2021). Having trouble with swelling, trying to keep knee elevated as much as possible.  Has HEP - ankle pumps, pulling knee up, walking as much as possible.  Reports knee starts to swell and ache if sits more than 45 min dependent position.    PERTINENT HISTORY: S/p L TKA on 09/05/2021, R TKA on 10/28/2020, gout, obesity  PAIN:  Are you having pain? Yes: NPRS scale: 3-4/10 Pain location: L knee, increases to 4-5/10 with walking, occasional 10/10 with movements like getting out of car Pain description: aching, throbbing Aggravating factors: walking, twisting, getting in/out of car, lifting leg Relieving factors: elevating, sleeping, pain medications  PRECAUTIONS: None  WEIGHT BEARING RESTRICTIONS No  FALLS:  Has patient fallen in last 6 months? No  LIVING ENVIRONMENT: Lives with: lives alone Lives in: House/apartment Stairs: Yes: Internal: 15 steps; on left going up Has following equipment at home: Walker - 2 wheeled and bed side commode  OCCUPATION: works from home,  business Optometrist  PLOF: Independent  PATIENT GOALS full ROM in L knee like R - the same strength, flexibility, extension and flexion.   Return to golf.    OBJECTIVE:   DIAGNOSTIC FINDINGS: 09/19/2021 XR L knee: AP and lateral views of left knee reviewed.  Left total knee prosthesis in  good position alignment without any complicating features.  No evidence of loosening noted.  No abnormal patellar height.  No periprosthetic fracture noted.  PATIENT SURVEYS:  LEFS 18/80.  22.5% ability = 77.5%  disability  COGNITION:  Overall cognitive status: Within functional limits for tasks assessed     SENSATION: Mild numbness lateral side L knee  EDEMA:  Circumferential: R 51.5 cm, L 52.5 cm  POSTURE: rounded shoulders and forward head  PALPATION: Tightness along scar  LOWER EXTREMITY ROM:  (A)ctive/(P)assive ROM Right eval Left eval  Knee flexion 110A, 115 P 98A, 100P  Knee extension 0 5   (Blank rows = not tested)  LOWER EXTREMITY MMT:  MMT Right eval Left eval  Hip flexion 5 4*  Hip extension    Hip abduction 4+ 4+  Hip adduction 5 5  Knee flexion 5 3+  Knee extension 5 4+   Ankle dorsiflexion 5 4*  Ankle plantarflexion     (Blank rows = not tested) *pain   FUNCTIONAL TESTS:  5 times sit to stand: 20 seconds without UE assist  GAIT: Distance walked: 50 Assistive device utilized: Environmental consultant - 2 wheeled Level of assistance: Modified independence Comments:  slow gait, reciprocal stepping, slightly antalgic, 0.48 m/s    TODAY'S TREATMENT: 10/10/21 - see patient education    PATIENT EDUCATION:  Education details: scar tissue mobilization, findings, POC, review of current HEP Person educated: Patient Education method: Customer service manager Education comprehension: verbalized understanding   HOME EXERCISE PROGRAM: TBD  ASSESSMENT:  CLINICAL IMPRESSION: Patient is a 61 y.o. male who was seen today for physical therapy evaluation and treatment for s/p L TKA on 09/05/2021.   He was discharged from SNF last week.  He demonstrates appropriate progress, L knee PROM was 5-100 today, noted some swelling in L knee and ankle but no tenderness in calf or signs of DVT, pain is well managed. He demonstrates independence with current HEP.  He would benefit from skilled physical therapy to improve LLE strength, L knee ROM, gait and balance in order to return to PLOF and be able to participate in activities safely without limitation.   OBJECTIVE IMPAIRMENTS Abnormal  gait, decreased balance, decreased endurance, decreased mobility, difficulty walking, decreased ROM, decreased strength, increased edema, increased fascial restrictions, increased muscle spasms, impaired flexibility, impaired sensation, and pain.   ACTIVITY LIMITATIONS carrying, lifting, bending, sitting, standing, squatting, stairs, transfers, and locomotion level  PARTICIPATION LIMITATIONS: meal prep, cleaning, laundry, driving, shopping, and community activity  PERSONAL FACTORS 1-2 comorbidities: R TKA, obesity, gout  are also affecting patient's functional outcome.   REHAB POTENTIAL: Excellent  CLINICAL DECISION MAKING: Stable/uncomplicated  EVALUATION COMPLEXITY: Low   GOALS: Goals reviewed with patient? Yes   SHORT TERM GOALS: Target date: 10/24/2021    Independent with initial HEP. Baseline: independent with current HEP.   Goal status: MET   LONG TERM GOALS: Target date: 12/05/2021   Independent with advanced/ongoing HEP to improve outcomes and carryover.  Baseline: needs progression.  Goal status: INITIAL  2.  [name]. will demonstrate left knee flexion to 115 deg to ascend/descend stairs. Baseline: 100 Goal status: INITIAL  3.  Blenda Bridegroom  Jr. will demonstrate full left knee extension for safety with gait. Baseline: lacking 5 deg due to edema Goal status: INITIAL  4.  [name]. will be able to ambulate 600' safely with LRAD and normal gait pattern to access community.  Baseline: slow antalgic with 2WRW Goal status: INITIAL  5.  [name]. will be able to ascend/descend stairs with 1 HR and reciprocal step pattern safely to access home and community.  Baseline: unable Goal status: INITIAL  6.  [name]. will demonstrate > 19/24 on DGI to demonstrate decreased risk of falls.   Baseline: to be assessed Goal status: INITIAL  7.  [name]. will demonstrate improved functional LE strength by completing 5x STS in  <15 seconds.  (MCID 5 seconds) Baseline: 20 seconds  Goal status: INITIAL  8.  [name]. will demonstrate >30/80 on LEFS to demonstrate improved mobility.  Baseline: 18/80 = 77.5% disability  Goal status: INITIAL  9.  [name]. will demonstrate 5/5 LLE strength without pain for safety with gait.  Baseline: see objective Goal status: INITIAL    PLAN: PT FREQUENCY: 2-3x/week  PT DURATION: 8 weeks  PLANNED INTERVENTIONS: Therapeutic exercises, Therapeutic activity, Neuromuscular re-education, Balance training, Gait training, Patient/Family education, Joint mobilization, Stair training, Aquatic Therapy, Dry Needling, Electrical stimulation, Cryotherapy, Moist heat, scar mobilization, Taping, Vasopneumatic device, Ultrasound, Ionotophoresis 73m/ml Dexamethasone, Manual therapy, and Re-evaluation  PLAN FOR NEXT SESSION: progress exercises - sitting and prone, manual therapy focusing on ROM, modalities PRN   ERennie Natter PT, DPT  10/10/2021, 12:22 PM

## 2021-10-10 NOTE — Telephone Encounter (Signed)
IC had at length conversation with patient. He stated he was released from [facility] on last Thursday. States that since then has found it difficult to get up in the mornings to get cleaned up/house care/ADL's/laundry/moving around in general and he feels he is struggling with all of these. He states he is having a hard time managing his pain. States his brother who is a cancer patient drove him to outpatient therapy today.  States that during his care with [facility] did not have a good relationship with his case mgr. Would like to have someone to come out as a home aide and discuss maybe changing from out patient therapy to home therapy?

## 2021-10-10 NOTE — Telephone Encounter (Signed)
Tried calling patient several time, rings multiple times and then call is disconnected each time.   Lawson Fiscal can you get him set up for CPM at home with Kipp Brood?

## 2021-10-11 NOTE — Telephone Encounter (Signed)
Per Kipp Brood, patient is being set up with CPM at home today.

## 2021-10-12 ENCOUNTER — Encounter: Payer: Self-pay | Admitting: Physical Therapy

## 2021-10-12 ENCOUNTER — Telehealth: Payer: Self-pay | Admitting: Orthopedic Surgery

## 2021-10-12 ENCOUNTER — Ambulatory Visit: Payer: BC Managed Care – PPO | Admitting: Physical Therapy

## 2021-10-12 DIAGNOSIS — M25562 Pain in left knee: Secondary | ICD-10-CM

## 2021-10-12 DIAGNOSIS — R262 Difficulty in walking, not elsewhere classified: Secondary | ICD-10-CM

## 2021-10-12 DIAGNOSIS — M6281 Muscle weakness (generalized): Secondary | ICD-10-CM

## 2021-10-12 DIAGNOSIS — M25662 Stiffness of left knee, not elsewhere classified: Secondary | ICD-10-CM

## 2021-10-12 NOTE — Telephone Encounter (Signed)
Pt called to speak to someone about last note on chart. Explained to pt that Ralph Dowdy an she was unsuccessful in contacting pt due to his phone. Pt state he need to speak with Dr. August Saucer or PA Franky Macho regarding care at his home. Also explained to pt he had a talk with Lauren F. Pt still wanted to speak to Dr August Saucer or PA Franky Macho. Pt phone number is  [phone].

## 2021-10-12 NOTE — Therapy (Signed)
OUTPATIENT PHYSICAL THERAPY Treatment Note   Patient Name: Ernest Navarro. MRN: [id number] DOB:1961-02-23, 61 y.o., male Today's Date: 10/12/2021   PT End of Session - 10/12/21 1311     Visit Number 2    Number of Visits 18    Date for PT Re-Evaluation 12/05/21    PT Start Time 1315    PT Stop Time 1412    PT Time Calculation (min) 57 min    Activity Tolerance Patient tolerated treatment well    Behavior During Therapy [facility] for tasks assessed/performed             Past Medical History:  Diagnosis Date   Arthritis    bilateral knees   Essential hypertension, benign 06/19/2012   Gout 06/19/2012   Headache    Hyperglycemia 06/19/2012   March 2014 A1c 6.5 (repeat needed)    Pneumonia    Pre-diabetes    Sleep apnea    Past Surgical History:  Procedure Laterality Date   KNEE SURGERY Right    x3 arthroscopies   TOTAL KNEE ARTHROPLASTY Right 10/28/2020   Procedure: RIGHT TOTAL KNEE ARTHROPLASTY;  Surgeon: Meredith Pel, MD;  Location: [place];  Service: Orthopedics;  Laterality: Right;   TOTAL KNEE ARTHROPLASTY Left 09/05/2021   Procedure: LEFT TOTAL KNEE ARTHROPLASTY;  Surgeon: Meredith Pel, MD;  Location: [place];  Service: Orthopedics;  Laterality: Left;   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   Arthritis of right knee    S/P total knee arthroplasty, right 10/28/2020   Obesity 06/29/2015   Obstructive sleep apnea 05/06/2015   Seasonal and perennial allergic rhinitis 05/06/2015   Hyperglycemia 06/19/2012   Essential hypertension, benign 06/19/2012   Gout 06/19/2012   Nocturia 06/19/2012    PCP: General medical clinic  REFERRING PROVIDER: Meredith Pel MD  REFERRING DIAG: [phone] (ICD-10-CM) - S/P total knee arthroplasty, left  THERAPY DIAG:  Acute pain of left knee  Stiffness of left knee, not elsewhere classified  Muscle weakness  (generalized)  Difficulty in walking, not elsewhere classified  Rationale for Evaluation and Treatment Rehabilitation  ONSET DATE: 09/05/2021  SUBJECTIVE:   SUBJECTIVE STATEMENT: Patient reports no significant changes since IE.  Has been performing HEP.  Got his CPM machine yesterday, has been icing after exercise.   PERTINENT HISTORY: S/p L TKA on 09/05/2021, R TKA on 10/28/2020, gout, obesity  PAIN:  Are you having pain? Yes: NPRS scale: 4/10 Pain location: L knee, increases to 4-5/10 with walking, occasional 10/10 with movements like getting out of car (and bike)  PRECAUTIONS: None  WEIGHT BEARING RESTRICTIONS No  FALLS:  Has patient fallen in last 6 months? No  LIVING ENVIRONMENT: Lives with: lives alone Lives in: House/apartment Stairs: Yes: Internal: 15 steps; on left going up Has following equipment at home: Walker - 2 wheeled and bed side commode  OCCUPATION: works from home, Personnel officer  PLOF: Independent  PATIENT GOALS full ROM in L knee like R - the same strength, flexibility, extension and flexion.   Return to golf.    OBJECTIVE:   DIAGNOSTIC FINDINGS: 09/19/2021 XR L knee: AP and lateral views of left knee reviewed.  Left total knee prosthesis in  good position alignment without any complicating features.  No evidence of loosening noted.  No abnormal patellar height.  No periprosthetic fracture noted.  PATIENT SURVEYS:  LEFS 18/80.  22.5% ability =  77.5% disability  COGNITION:  Overall cognitive status: Within functional limits for tasks assessed     SENSATION: Mild numbness lateral side L knee  EDEMA:  Circumferential: R 51.5 cm, L 52.5 cm  POSTURE: rounded shoulders and forward head  PALPATION: Tightness along scar  LOWER EXTREMITY ROM:  (A)ctive/(P)assive ROM Right eval Left eval Left 10/12/2021  Knee flexion 110A, 115 P 98A, 100P 110 P  Knee extension 0 5 4   (Blank rows = not tested)  LOWER EXTREMITY MMT:  MMT Right eval  Left eval  Hip flexion 5 4*  Hip extension    Hip abduction 4+ 4+  Hip adduction 5 5  Knee flexion 5 3+  Knee extension 5 4+   Ankle dorsiflexion 5 4*  Ankle plantarflexion     (Blank rows = not tested) *pain   FUNCTIONAL TESTS:  5 times sit to stand: 20 seconds without UE assist  GAIT: Distance walked: 50 Assistive device utilized: Walker - 2 wheeled Level of assistance: Modified independence Comments:  slow gait, reciprocal stepping, slightly antalgic, 0.48 m/s    TODAY'S TREATMENT: 10/12/2021 Therapeutic Exercise: to improve strength and mobility.  Demo, verbal and tactile cues throughout for technique. Bike (seat 12) L1 x 6 min At counter for safety: Heel raises x 15 Toe raises x 15 Hip abduction x 10 bil  Hip extension x 10 bil  Hamstring curls x 10 bil  Retro steps x 10 bil (with contralateral arm raise)  Seated hip flexor stretch (legs 90/90) Prone - L leg extension x 10 bil, hamstring isometrics 5 x 5 sec hold Manual Therapy: to improve ROM In prone with L hip extended - passive ROM flexion with rotational movements to relax glutes, contract relax 5 x 5 sec to quads, able to bend knee 90 deg in prone afterwards.  Patellar mobs, scar tissue mobilization.  Vasopneumatic: Gameready at end of session for post session soreness/edema to LLE.  15 min, low compression, 34 deg (coldest).    10/10/21 - see patient education    PATIENT EDUCATION:  Education details: HEP update for standing exercises.  Person educated: Patient Education method: Customer service manager Education comprehension: verbalized understanding   HOME EXERCISE PROGRAM: Access Code: [email or web address]   ASSESSMENT:  CLINICAL IMPRESSION: [name]. Returns today for first appt. Following evaluation.  Today focused on progressing HEP to include standing exercises, several seated rest breaks were needed and reported some increased in L knee pain, but overall tolerated well.  HEP updated, to  perform at kitchen sink for safety.  After manual therapy he measured 4-110 deg which is improvement over evaluation.  Vasopneumatic (GameReady) applied at end of session for post-session edema and soreness.  Reported he felt he walked better leaving therapy than coming in today.  [name]. continues to demonstrate potential for improvement and would benefit from continued skilled therapy to address impairments.       OBJECTIVE IMPAIRMENTS Abnormal gait, decreased balance, decreased endurance, decreased mobility, difficulty walking, decreased ROM, decreased strength, increased edema, increased fascial restrictions, increased muscle spasms, impaired flexibility, impaired sensation, and pain.   ACTIVITY LIMITATIONS carrying, lifting, bending, sitting, standing, squatting, stairs, transfers, and locomotion level  PARTICIPATION LIMITATIONS: meal prep, cleaning, laundry, driving, shopping, and community activity  PERSONAL FACTORS 1-2 comorbidities: R TKA, obesity, gout  are also affecting patient's functional outcome.   REHAB POTENTIAL: Excellent  CLINICAL DECISION MAKING: Stable/uncomplicated  EVALUATION COMPLEXITY: Low   GOALS: Goals reviewed with patient? Yes  SHORT TERM GOALS: Target date: 10/24/2021    Independent with initial HEP. Baseline: independent with current HEP.   Goal status: MET   LONG TERM GOALS: Target date: 12/05/2021   Independent with advanced/ongoing HEP to improve outcomes and carryover.  Baseline: needs progression.  Goal status: IN PROGRESS  2.  [name]. will demonstrate left knee flexion to 115 deg to ascend/descend stairs. Baseline: 100 Goal status: IN PROGRESS  3.  [name]. will demonstrate full left knee extension for safety with gait. Baseline: lacking 5 deg due to edema Goal status: IN PROGRESS  4.  [name]. will be able to ambulate 600' safely with LRAD and normal gait pattern to access community.   Baseline: slow antalgic with 2WRW Goal status: IN PROGRESS  5.  [name]. will be able to ascend/descend stairs with 1 HR and reciprocal step pattern safely to access home and community.  Baseline: unable Goal status: IN PROGRESS  6.  Grover Robinson. will demonstrate > 19/24 on DGI to demonstrate decreased risk of falls.   Baseline: to be assessed Goal status: IN PROGRESS  7.  [name]. will demonstrate improved functional LE strength by completing 5x STS in <15 seconds.  (MCID 5 seconds) Baseline: 20 seconds  Goal status: IN PROGRESS  8.  [name]. will demonstrate >30/80 on LEFS to demonstrate improved mobility.  Baseline: 18/80 = 77.5% disability  Goal status: IN PROGRESS  9.  [name]. will demonstrate 5/5 LLE strength without pain for safety with gait.  Baseline: see objective Goal status: IN PROGRESS    PLAN: PT FREQUENCY: 2-3x/week  PT DURATION: 8 weeks  PLANNED INTERVENTIONS: Therapeutic exercises, Therapeutic activity, Neuromuscular re-education, Balance training, Gait training, Patient/Family education, Joint mobilization, Stair training, Aquatic Therapy, Dry Needling, Electrical stimulation, Cryotherapy, Moist heat, scar mobilization, Taping, Vasopneumatic device, Ultrasound, Ionotophoresis 14m/ml Dexamethasone, Manual therapy, and Re-evaluation  PLAN FOR NEXT SESSION:  continue to progress exercises as tolerated, manual therapy focusing on ROM, modalities PRN   ERennie Natter PT, DPT  10/12/2021, 2:03 PM

## 2021-10-12 NOTE — Telephone Encounter (Signed)
Ok thx.

## 2021-10-14 ENCOUNTER — Other Ambulatory Visit: Payer: Self-pay | Admitting: Surgical

## 2021-10-14 ENCOUNTER — Telehealth: Payer: Self-pay | Admitting: Orthopedic Surgery

## 2021-10-14 MED ORDER — TRAMADOL HCL 50 MG PO TABS: 100.0000 mg | ORAL_TABLET | Freq: Four times a day (QID) | ORAL | 0 refills | Status: DC | PRN

## 2021-10-14 NOTE — Telephone Encounter (Signed)
Pt called requesting to switch to tramadol as soon as possible. Please send to [facility] on [address]. Pt phone number is [phone].

## 2021-10-14 NOTE — Telephone Encounter (Signed)
I sent in a prescription for tramadol.  Please remind him not to take this with Dilaudid and completely discontinued the Dilaudid if he wants to switch to the tramadol.  He cannot take both medications.

## 2021-10-17 NOTE — Telephone Encounter (Signed)
Tried calling to advise. No answer, no VM picked up to LM 

## 2021-10-18 NOTE — Telephone Encounter (Signed)
Patient called in stating his leg is aching and he feels he needs a stronger medication please advise

## 2021-10-18 NOTE — Telephone Encounter (Signed)
Tried calling to advise. No answer, no VM picked up to LM 

## 2021-10-19 ENCOUNTER — Other Ambulatory Visit: Payer: Self-pay | Admitting: Surgical

## 2021-10-19 ENCOUNTER — Ambulatory Visit: Payer: BC Managed Care – PPO | Admitting: Physical Therapy

## 2021-10-19 ENCOUNTER — Encounter: Payer: Self-pay | Admitting: Physical Therapy

## 2021-10-19 ENCOUNTER — Telehealth: Payer: Self-pay | Admitting: Orthopedic Surgery

## 2021-10-19 DIAGNOSIS — M25562 Pain in left knee: Secondary | ICD-10-CM

## 2021-10-19 DIAGNOSIS — M6281 Muscle weakness (generalized): Secondary | ICD-10-CM

## 2021-10-19 DIAGNOSIS — R262 Difficulty in walking, not elsewhere classified: Secondary | ICD-10-CM

## 2021-10-19 DIAGNOSIS — M25662 Stiffness of left knee, not elsewhere classified: Secondary | ICD-10-CM

## 2021-10-19 NOTE — Therapy (Signed)
OUTPATIENT PHYSICAL THERAPY Treatment Note   Patient Name: Ernest Navarro. MRN: [id number] DOB:24-May-1960, 61 y.o., male Today's Date: 10/19/2021   PT End of Session - 10/19/21 1619     Visit Number 3    Number of Visits 18    Date for PT Re-Evaluation 12/05/21    PT Start Time 2500    PT Stop Time 1710    PT Time Calculation (min) 53 min    Activity Tolerance Patient tolerated treatment well    Behavior During Therapy [facility] for tasks assessed/performed             Past Medical History:  Diagnosis Date   Arthritis    bilateral knees   Essential hypertension, benign 06/19/2012   Gout 06/19/2012   Headache    Hyperglycemia 06/19/2012   March 2014 A1c 6.5 (repeat needed)    Pneumonia    Pre-diabetes    Sleep apnea    Past Surgical History:  Procedure Laterality Date   KNEE SURGERY Right    x3 arthroscopies   TOTAL KNEE ARTHROPLASTY Right 10/28/2020   Procedure: RIGHT TOTAL KNEE ARTHROPLASTY;  Surgeon: Meredith Pel, MD;  Location: [place];  Service: Orthopedics;  Laterality: Right;   TOTAL KNEE ARTHROPLASTY Left 09/05/2021   Procedure: LEFT TOTAL KNEE ARTHROPLASTY;  Surgeon: Meredith Pel, MD;  Location: [place];  Service: Orthopedics;  Laterality: Left;   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   Arthritis of right knee    S/P total knee arthroplasty, right 10/28/2020   Obesity 06/29/2015   Obstructive sleep apnea 05/06/2015   Seasonal and perennial allergic rhinitis 05/06/2015   Hyperglycemia 06/19/2012   Essential hypertension, benign 06/19/2012   Gout 06/19/2012   Nocturia 06/19/2012    PCP: General medical clinic  REFERRING PROVIDER: Meredith Pel MD  REFERRING DIAG: [phone] (ICD-10-CM) - S/P total knee arthroplasty, left  THERAPY DIAG:  Acute pain of left knee  Stiffness of left knee, not elsewhere classified  Muscle weakness  (generalized)  Difficulty in walking, not elsewhere classified  Rationale for Evaluation and Treatment Rehabilitation  ONSET DATE: 09/05/2021  SUBJECTIVE:   SUBJECTIVE STATEMENT: Patient reported having difficulty with pain over weekend, called MD on Monday who increased dosage and frequency of pain mediation, so much better controlled now.  Still having trouble with swelling in knee, icing it and keeping elevated.  Sees MD for follow-up on Friday.  CPM machine stopped working Monday night.   Arrived with [facility].  PERTINENT HISTORY: S/p L TKA on 09/05/2021, R TKA on 10/28/2020, gout, obesity  PAIN:  Are you having pain? Yes: NPRS scale: 3-4/10 Pain location: L knee  PRECAUTIONS: None  WEIGHT BEARING RESTRICTIONS No  FALLS:  Has patient fallen in last 6 months? No  LIVING ENVIRONMENT: Lives with: lives alone Lives in: House/apartment Stairs: Yes: Internal: 15 steps; on left going up Has following equipment at home: Walker - 2 wheeled and bed side commode  OCCUPATION: works from home, Personnel officer  PLOF: Independent  PATIENT GOALS full ROM in L knee like R - the same strength, flexibility, extension and flexion.   Return to golf.    OBJECTIVE:   DIAGNOSTIC FINDINGS: 09/19/2021 XR L knee: AP and lateral views of left knee reviewed.  Left total knee prosthesis in  good position alignment without any complicating features.  No evidence of loosening noted.  No  abnormal patellar height.  No periprosthetic fracture noted.  PATIENT SURVEYS:  LEFS 18/80.  22.5% ability = 77.5% disability  COGNITION:  Overall cognitive status: Within functional limits for tasks assessed     SENSATION: Mild numbness lateral side L knee  EDEMA:  Circumferential: R 51.5 cm, L 52.5 cm  POSTURE: rounded shoulders and forward head  PALPATION: Tightness along scar  LOWER EXTREMITY ROM:  (A)ctive/(P)assive ROM Right eval Left eval Left 10/12/2021 Left 10/19/2021  Knee flexion 110A, 115 P  98A, 100P 110 P 118  Knee extension 0 5 4 0   (Blank rows = not tested)  LOWER EXTREMITY MMT:  MMT Right eval Left eval  Hip flexion 5 4*  Hip extension    Hip abduction 4+ 4+  Hip adduction 5 5  Knee flexion 5 3+  Knee extension 5 4+   Ankle dorsiflexion 5 4*  Ankle plantarflexion     (Blank rows = not tested) *pain   FUNCTIONAL TESTS:  5 times sit to stand: 20 seconds without UE assist  GAIT: Distance walked: 50 Assistive device utilized: Walker - 2 wheeled Level of assistance: Modified independence Comments:  slow gait, reciprocal stepping, slightly antalgic, 0.48 m/s    TODAY'S TREATMENT: 10/19/2021 Therapeutic Exercise: to improve strength and mobility.  Demo, verbal and tactile cues throughout for technique. Bike (seat 11) L1 x 8 min  Gait x 200' with SPC, cues for heel strike Step ups (no riser) 2 x 10 bil 2 UE support with step up on L Step down - no riser x 10 bil - 1UE support stepping down on R, switched to dips due to pain.  Sit to stands x 10  [employer] pews 3 x 10  Seated LAQ x 10 with heel pump - no quad lag In prone- hip extension 2 x 10 bil Prone Hamstring curls L x 10 Prone hip extension with leg bent x 10 L Contract relax quad 5 x 5 sec hold isometric strengthening.  Manual Therapy: to improve ROM In prone with L hip extended - passive ROM flexion with rotational movements to relax glutes, contract relax 5 x 5 sec to quads Vasopneumatic: Gameready at end of session for post session soreness/edema to LLE.  15 min, low compression, 34 deg (coldest).   10/12/2021 Therapeutic Exercise: to improve strength and mobility.  Demo, verbal and tactile cues throughout for technique. Bike (seat 12) L1 x 6 min At counter for safety: Heel raises x 15 Toe raises x 15 Hip abduction x 10 bil  Hip extension x 10 bil  Hamstring curls x 10 bil  Retro steps x 10 bil (with contralateral arm raise)  Seated hip flexor stretch (legs 90/90) Prone - L leg extension x 10  bil, hamstring isometrics 5 x 5 sec hold Manual Therapy: to improve ROM In prone with L hip extended - passive ROM flexion with rotational movements to relax glutes, contract relax 5 x 5 sec to quads, able to bend knee 90 deg in prone afterwards.  Patellar mobs, scar tissue mobilization.  Vasopneumatic: Gameready at end of session for post session soreness/edema to LLE.  15 min, low compression, 34 deg (coldest).    10/10/21 - see patient education    PATIENT EDUCATION:  Education details: HEP update for standing exercises.  Person educated: Patient Education method: Customer service manager Education comprehension: verbalized understanding   HOME EXERCISE PROGRAM: Access Code: [email or web address]   ASSESSMENT:  CLINICAL IMPRESSION: Ernest Navarro. Is making good progress and  reporting better pain control.  He demonstrates improving gait with SPC, taking even steps and tolerating increased distance.  Encouraged to walk more at home.  ROM continues to improve measuring 0-118 with no quad lag with LAQ. Introduced step ups and step downs which was challenging today, especially stepping down with R foot.  Game ready end of session for post exercise pain and edema.  Ernest Navarro. continues to demonstrate potential for improvement and would benefit from continued skilled therapy to address impairments.      OBJECTIVE IMPAIRMENTS Abnormal gait, decreased balance, decreased endurance, decreased mobility, difficulty walking, decreased ROM, decreased strength, increased edema, increased fascial restrictions, increased muscle spasms, impaired flexibility, impaired sensation, and pain.   ACTIVITY LIMITATIONS carrying, lifting, bending, sitting, standing, squatting, stairs, transfers, and locomotion level  PARTICIPATION LIMITATIONS: meal prep, cleaning, laundry, driving, shopping, and community activity  PERSONAL FACTORS 1-2 comorbidities: R TKA, obesity, gout  are also affecting patient's  functional outcome.   REHAB POTENTIAL: Excellent  CLINICAL DECISION MAKING: Stable/uncomplicated  EVALUATION COMPLEXITY: Low   GOALS: Goals reviewed with patient? Yes   SHORT TERM GOALS: Target date: 10/24/2021    Independent with initial HEP. Baseline: independent with current HEP.   Goal status: MET   LONG TERM GOALS: Target date: 12/05/2021   Independent with advanced/ongoing HEP to improve outcomes and carryover.  Baseline: needs progression.  Goal status: IN PROGRESS  2.  Ernest Navarro. will demonstrate left knee flexion to 115 deg to ascend/descend stairs. Baseline: 100 Goal status: IN PROGRESS  3.  Ernest Navarro. will demonstrate full left knee extension for safety with gait. Baseline: lacking 5 deg due to edema Goal status: IN PROGRESS  4.  Ernest Navarro. will be able to ambulate 600' safely with LRAD and normal gait pattern to access community.  Baseline: slow antalgic with 2WRW Goal status: IN PROGRESS  5.  Ernest Navarro. will be able to ascend/descend stairs with 1 HR and reciprocal step pattern safely to access home and community.  Baseline: unable Goal status: IN PROGRESS  6.  Topher Buenaventura. will demonstrate > 19/24 on DGI to demonstrate decreased risk of falls.   Baseline: to be assessed Goal status: IN PROGRESS  7.  Ernest Navarro. will demonstrate improved functional LE strength by completing 5x STS in <15 seconds.  (MCID 5 seconds) Baseline: 20 seconds  Goal status: IN PROGRESS  8.  Ernest Navarro. will demonstrate >30/80 on LEFS to demonstrate improved mobility.  Baseline: 18/80 = 77.5% disability  Goal status: IN PROGRESS  9.  Ernest Navarro. will demonstrate 5/5 LLE strength without pain for safety with gait.  Baseline: see objective Goal status: IN PROGRESS    PLAN: PT FREQUENCY: 2-3x/week  PT DURATION: 8 weeks  PLANNED INTERVENTIONS: Therapeutic exercises, Therapeutic activity, Neuromuscular  re-education, Balance training, Gait training, Patient/Family education, Joint mobilization, Stair training, Aquatic Therapy, Dry Needling, Electrical stimulation, Cryotherapy, Moist heat, scar mobilization, Taping, Vasopneumatic device, Ultrasound, Ionotophoresis 78m/ml Dexamethasone, Manual therapy, and Re-evaluation  PLAN FOR NEXT SESSION:  continue to progress exercises as tolerated, manual therapy focusing on ROM, modalities PRN   ERennie Natter PT, DPT  10/19/2021, 6:07 PM

## 2021-10-19 NOTE — Telephone Encounter (Signed)
He just wanted to switch to Tramadol. What does he want to take for postop pain?

## 2021-10-19 NOTE — Telephone Encounter (Signed)
Ok thx.

## 2021-10-19 NOTE — Telephone Encounter (Signed)
FYI patient called stating that his CPM machine has stopped working and he has called the company to come pick it up from his home. CB # [phone]

## 2021-10-21 ENCOUNTER — Encounter: Payer: Self-pay | Admitting: Orthopedic Surgery

## 2021-10-21 ENCOUNTER — Ambulatory Visit (INDEPENDENT_AMBULATORY_CARE_PROVIDER_SITE_OTHER): Payer: BC Managed Care – PPO | Admitting: Orthopedic Surgery

## 2021-10-21 DIAGNOSIS — Z96652 Presence of left artificial knee joint: Secondary | ICD-10-CM

## 2021-10-21 NOTE — Progress Notes (Signed)
   Post-Op Visit Note   Patient: Ernest Navarro.           Date of Birth: Jul 11, 1960           MRN: [id number] Visit Date: 10/21/2021 PCP: Clinic, General Medical   Assessment & Plan:  Chief Complaint: No chief complaint on file.  Visit Diagnoses: No diagnosis found.  Plan: Ernest Navarro is a 61 year old patient who is now 6 weeks out left total knee replacement.  Has good and bad days.  Ambulates with some pain.  He is at home.  Week and a half back was hard.  He does have decreased standing and walking endurance.  Stairs are difficult for him and his office is upstairs.  Has more physical therapy scheduled through August.  Tramadol is helping him.  On examination range of motion is 0-1 18.  Quad strength is improving.  No calf tenderness negative Homans.  Plan at this time is patient is progressing reasonably well but does have decreased standing and walking endurance.  I think would be good for him to stay out for 6 more weeks due to ambulatory demands.  Follow-up the week before September 1 with anticipated return to work September 1 at that time.  Follow-Up Instructions: No follow-ups on file.   Orders:  No orders of the defined types were placed in this encounter.  No orders of the defined types were placed in this encounter.   Imaging: No results found.  PMFS History: 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   Arthritis of right knee    S/P total knee arthroplasty, right 10/28/2020   Obesity 06/29/2015   Obstructive sleep apnea 05/06/2015   Seasonal and perennial allergic rhinitis 05/06/2015   Hyperglycemia 06/19/2012   Essential hypertension, benign 06/19/2012   Gout 06/19/2012   Nocturia 06/19/2012   Past Medical History:  Diagnosis Date   Arthritis    bilateral knees   Essential hypertension, benign 06/19/2012   Gout 06/19/2012   Headache    Hyperglycemia 06/19/2012   March  2014 A1c 6.5 (repeat needed)    Pneumonia    Pre-diabetes    Sleep apnea     Family History  Problem Relation Age of Onset   Colon cancer Neg Hx     Past Surgical History:  Procedure Laterality Date   KNEE SURGERY Right    x3 arthroscopies   TOTAL KNEE ARTHROPLASTY Right 10/28/2020   Procedure: RIGHT TOTAL KNEE ARTHROPLASTY;  Surgeon: Cammy Copa, MD;  Location: [facility];  Service: Orthopedics;  Laterality: Right;   TOTAL KNEE ARTHROPLASTY Left 09/05/2021   Procedure: LEFT TOTAL KNEE ARTHROPLASTY;  Surgeon: Cammy Copa, MD;  Location: [facility];  Service: Orthopedics;  Laterality: Left;   Social History   Occupational History   Not on file  Tobacco Use   Smoking status: Never   Smokeless tobacco: Never  Vaping Use   Vaping Use: Never used  Substance and Sexual Activity   Alcohol use: No    Alcohol/week: 0.0 standard drinks of alcohol   Drug use: No   Sexual activity: Not on file

## 2021-10-23 ENCOUNTER — Other Ambulatory Visit: Payer: Self-pay | Admitting: Surgical

## 2021-10-24 ENCOUNTER — Other Ambulatory Visit: Payer: Self-pay | Admitting: Orthopedic Surgery

## 2021-10-24 MED ORDER — TRAMADOL HCL 50 MG PO TABS: 100.0000 mg | ORAL_TABLET | Freq: Four times a day (QID) | ORAL | 0 refills | Status: DC | PRN

## 2021-10-25 ENCOUNTER — Encounter: Payer: Self-pay | Admitting: Gastroenterology

## 2021-10-25 ENCOUNTER — Ambulatory Visit: Payer: BC Managed Care – PPO | Admitting: Physical Therapy

## 2021-10-25 ENCOUNTER — Ambulatory Visit: Payer: BC Managed Care – PPO | Admitting: Gastroenterology

## 2021-10-25 ENCOUNTER — Encounter: Payer: Self-pay | Admitting: Physical Therapy

## 2021-10-25 ENCOUNTER — Encounter: Payer: Self-pay | Admitting: Orthopedic Surgery

## 2021-10-25 VITALS — BP 120/80 | HR 96 | Ht 72.0 in | Wt 299.8 lb

## 2021-10-25 DIAGNOSIS — R12 Heartburn: Secondary | ICD-10-CM

## 2021-10-25 DIAGNOSIS — M25562 Pain in left knee: Secondary | ICD-10-CM

## 2021-10-25 DIAGNOSIS — M25662 Stiffness of left knee, not elsewhere classified: Secondary | ICD-10-CM

## 2021-10-25 DIAGNOSIS — M6281 Muscle weakness (generalized): Secondary | ICD-10-CM

## 2021-10-25 DIAGNOSIS — Z1211 Encounter for screening for malignant neoplasm of colon: Secondary | ICD-10-CM | POA: Diagnosis not present

## 2021-10-25 DIAGNOSIS — R262 Difficulty in walking, not elsewhere classified: Secondary | ICD-10-CM

## 2021-10-25 NOTE — Therapy (Addendum)
OUTPATIENT PHYSICAL THERAPY Treatment Note   Patient Name: Ernest Navarro. MRN: [id number] DOB:[birth year], 61 y.o., male Today's Date: 10/25/2021   PT End of Session - 10/25/21 1533     Visit Number 4    Number of Visits 18    Date for PT Re-Evaluation 12/05/21    PT Start Time 4401    PT Stop Time 1626    PT Time Calculation (min) 56 min    Activity Tolerance Patient tolerated treatment well    Behavior During Therapy [facility] for tasks assessed/performed             Past Medical History:  Diagnosis Date   Arthritis    bilateral knees   Essential hypertension, benign 06/19/2012   Gout 06/19/2012   Headache    Hyperglycemia 06/19/2012   March 2014 A1c 6.5 (repeat needed)    Pneumonia    Pre-diabetes    Sleep apnea    Past Surgical History:  Procedure Laterality Date   KNEE SURGERY Right    x3 arthroscopies   TOTAL KNEE ARTHROPLASTY Right 10/28/2020   Procedure: RIGHT TOTAL KNEE ARTHROPLASTY;  Surgeon: Meredith Pel, MD;  Location: [employer];  Service: Orthopedics;  Laterality: Right;   TOTAL KNEE ARTHROPLASTY Left 09/05/2021   Procedure: LEFT TOTAL KNEE ARTHROPLASTY;  Surgeon: Meredith Pel, MD;  Location: [place];  Service: Orthopedics;  Laterality: Left;   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   Arthritis of right knee    S/P total knee arthroplasty, right 10/28/2020   Obesity 06/29/2015   Obstructive sleep apnea 05/06/2015   Seasonal and perennial allergic rhinitis 05/06/2015   Hyperglycemia 06/19/2012   Essential hypertension, benign 06/19/2012   Gout 06/19/2012   Nocturia 06/19/2012    PCP: General medical clinic  REFERRING PROVIDER: Meredith Pel MD  REFERRING DIAG: [id number] (ICD-10-CM) - S/P total knee arthroplasty, left  THERAPY DIAG:  Acute pain of left knee  Stiffness of left knee, not elsewhere classified  Muscle weakness  (generalized)  Difficulty in walking, not elsewhere classified  Rationale for Evaluation and Treatment Rehabilitation  ONSET DATE: 09/05/2021  SUBJECTIVE:   SUBJECTIVE STATEMENT: Patient reports short term disability extended to Sept. 1.  Reports orthopedist pleased with progress, next follow-up also Sept. 1.  Reporting swelling/pain mostly L medial knee.  Arrived with [facility].  PERTINENT HISTORY: S/p L TKA on 09/05/2021, R TKA on 10/28/2020, gout, obesity  PAIN:  Are you having pain? Yes: NPRS scale: 3/10 Pain location: L knee  PRECAUTIONS: None  WEIGHT BEARING RESTRICTIONS No  FALLS:  Has patient fallen in last 6 months? No  LIVING ENVIRONMENT: Lives with: lives alone Lives in: House/apartment Stairs: Yes: Internal: 15 steps; on left going up Has following equipment at home: Walker - 2 wheeled and bed side commode  OCCUPATION: works from home, Personnel officer  PLOF: Independent  PATIENT GOALS full ROM in L knee like R - the same strength, flexibility, extension and flexion.   Return to golf.    OBJECTIVE:   DIAGNOSTIC FINDINGS: 09/19/2021 XR L knee: AP and lateral views of left knee reviewed.  Left total knee prosthesis in  good position alignment without any complicating features.  No evidence of loosening noted.  No abnormal patellar height.  No periprosthetic fracture noted.  PATIENT SURVEYS:  LEFS 18/80.  22.5% ability = 77.5% disability  COGNITION:  Overall cognitive status:  Within functional limits for tasks assessed     SENSATION: Mild numbness lateral side L knee  EDEMA:  Circumferential: R 51.5 cm, L 52.5 cm  POSTURE: rounded shoulders and forward head  PALPATION: Tightness along scar  LOWER EXTREMITY ROM:  (A)ctive/(P)assive ROM Right eval Left eval Left 10/12/2021 Left 10/19/2021  Knee flexion 110A, 115 P 98A, 100P 110 P 118  Knee extension 0 5 4 0   (Blank rows = not tested)  LOWER EXTREMITY MMT:  MMT Right eval Left eval  Hip  flexion 5 4*  Hip extension    Hip abduction 4+ 4+  Hip adduction 5 5  Knee flexion 5 3+  Knee extension 5 4+   Ankle dorsiflexion 5 4*  Ankle plantarflexion     (Blank rows = not tested) *pain   FUNCTIONAL TESTS:  5 times sit to stand: 20 seconds without UE assist  GAIT: Distance walked: 50 Assistive device utilized: Walker - 2 wheeled Level of assistance: Modified independence Comments:  slow gait, reciprocal stepping, slightly antalgic, 0.48 m/s    TODAY'S TREATMENT: 10/25/2021 Therapeutic Exercise: to improve strength and mobility.  Bike L2 x 7 min  Step ups (1 riser) 2 x 10 bil  On regular step - step ups x 10 LLE only  Stairs - step to gait with HR and SPC, SBA for safety In supine LAQ 2 x 10 L Bridges 2 x 10  Manual Therapy: to decrease muscle spasm and pain and improve mobility.  IASTM to L medial quad with s/s tools, scar tissue mobilization, patellar mobs Vasopneumatic: Gameready at end of session for post session soreness/edema.  10 min, low compression, 34 deg (coldest).   10/19/2021 Therapeutic Exercise: to improve strength and mobility.  Demo, verbal and tactile cues throughout for technique. Bike (seat 11) L1 x 8 min  Gait x 200' with SPC, cues for heel strike Step ups (no riser) 2 x 10 bil 2 UE support with step up on L Step down - no riser x 10 bil - 1UE support stepping down on R, switched to dips due to pain.  Sit to stands x 10  [employer] pews 3 x 10  Seated LAQ x 10 with heel pump - no quad lag In prone- hip extension 2 x 10 bil Prone Hamstring curls L x 10 Prone hip extension with leg bent x 10 L Contract relax quad 5 x 5 sec hold isometric strengthening.  Manual Therapy: to improve ROM In prone with L hip extended - passive ROM flexion with rotational movements to relax glutes, contract relax 5 x 5 sec to quads Vasopneumatic: Gameready at end of session for post session soreness/edema to LLE.  15 min, low compression, 34 deg (coldest).   10/12/2021  Therapeutic Exercise: to improve strength and mobility.  Demo, verbal and tactile cues throughout for technique. Bike (seat 12) L1 x 6 min At counter for safety: Heel raises x 15 Toe raises x 15 Hip abduction x 10 bil  Hip extension x 10 bil  Hamstring curls x 10 bil  Retro steps x 10 bil (with contralateral arm raise)  Seated hip flexor stretch (legs 90/90) Prone - L leg extension x 10 bil, hamstring isometrics 5 x 5 sec hold Manual Therapy: to improve ROM In prone with L hip extended - passive ROM flexion with rotational movements to relax glutes, contract relax 5 x 5 sec to quads, able to bend knee 90 deg in prone afterwards.  Patellar mobs, scar tissue mobilization.  Vasopneumatic: Gameready at end of session for post session soreness/edema to LLE.  15 min, low compression, 34 deg (coldest).    10/10/21 - see patient education    PATIENT EDUCATION:  Education details: add step ups to HEP.  Person educated: Patient Education method: Customer service manager Education comprehension: verbalized understanding   HOME EXERCISE PROGRAM: Access Code: [email or web address]   ASSESSMENT:  CLINICAL IMPRESSION: Ernest Navarro. Still reporting L knee pain and swelling, but improving.  He reports very good compliance with HEP.   Today focused on stairs, as living area is on second floor and has not been able to access.  He was able to ascend/descend 1 flight stairs with step to gait, HR and SPC safely.  Also recommended working on step ups at home on 1st step on bottom for strengthening.  Noted edema in L knee during manual therapy.  Game ready at end to address post session soreness and edema.   Ernest Navarro. continues to demonstrate potential for improvement and would benefit from continued skilled therapy to address impairments.      OBJECTIVE IMPAIRMENTS Abnormal gait, decreased balance, decreased endurance, decreased mobility, difficulty walking, decreased ROM, decreased strength,  increased edema, increased fascial restrictions, increased muscle spasms, impaired flexibility, impaired sensation, and pain.   ACTIVITY LIMITATIONS carrying, lifting, bending, sitting, standing, squatting, stairs, transfers, and locomotion level  PARTICIPATION LIMITATIONS: meal prep, cleaning, laundry, driving, shopping, and community activity  PERSONAL FACTORS 1-2 comorbidities: R TKA, obesity, gout  are also affecting patient's functional outcome.   REHAB POTENTIAL: Excellent  CLINICAL DECISION MAKING: Stable/uncomplicated  EVALUATION COMPLEXITY: Low   GOALS: Goals reviewed with patient? Yes   SHORT TERM GOALS: Target date: 10/24/2021    Independent with initial HEP. Baseline: independent with current HEP.   Goal status: MET   LONG TERM GOALS: Target date: 12/05/2021   Independent with advanced/ongoing HEP to improve outcomes and carryover.  Baseline: needs progression.  Goal status: IN PROGRESS  2.  Ernest Navarro. will demonstrate left knee flexion to 115 deg to ascend/descend stairs. Baseline: 100 Goal status: MET  10/19/2021 -118  3.  Ernest Navarro. will demonstrate full left knee extension for safety with gait. Baseline: lacking 5 deg due to edema Goal status: IN PROGRESS  4.  Ernest Navarro. will be able to ambulate 600' safely with LRAD and normal gait pattern to access community.  Baseline: slow antalgic with 2WRW Goal status: IN PROGRESS  5.  Ernest Navarro. will be able to ascend/descend stairs with 1 HR and reciprocal step pattern safely to access home and community.  Baseline: unable Goal status: IN PROGRESS  6.  Jabier Deese. will demonstrate > 19/24 on DGI to demonstrate decreased risk of falls.   Baseline: to be assessed Goal status: IN PROGRESS  7.  Ernest Navarro. will demonstrate improved functional LE strength by completing 5x STS in <15 seconds.  (MCID 5 seconds) Baseline: 20 seconds  Goal status: IN PROGRESS  8.   Ernest Navarro. will demonstrate >30/80 on LEFS to demonstrate improved mobility.  Baseline: 18/80 = 77.5% disability  Goal status: IN PROGRESS  9.  Ernest Navarro. will demonstrate 5/5 LLE strength without pain for safety with gait.  Baseline: see objective Goal status: IN PROGRESS    PLAN: PT FREQUENCY: 2-3x/week  PT DURATION: 8 weeks  PLANNED INTERVENTIONS: Therapeutic exercises, Therapeutic activity, Neuromuscular re-education, Balance training, Gait training, Patient/Family  education, Engineer, manufacturing, Stair training, Aquatic Therapy, Dry Needling, Electrical stimulation, Cryotherapy, Moist heat, scar mobilization, Taping, Vasopneumatic device, Ultrasound, Ionotophoresis 56m/ml Dexamethasone, Manual therapy, and Re-evaluation  PLAN FOR NEXT SESSION:  continue to progress exercises as tolerated, especially stairs, manual therapy focusing on ROM, modalities PRN   ERennie Natter PT, DPT  10/25/2021, 4:36 PM

## 2021-10-25 NOTE — Progress Notes (Signed)
[facility] Consult Note:  History: Ernest Navarro. 10/25/2021  Referring provider: Clinic, General Medical  Reason for consult/chief complaint: discuss colonoscopy (Patient indicates that he was advised by insurance provider that he is eligible for another colonoscopy. He denies any current gi complaints; no bleeding, no change in bowels, no family hx colon cancer.)   Subjective  HPI:  Ernest Navarro was here to discuss possible colonoscopy.  He received notification from his insurance provider stating that he is eligible for another colonoscopy. Ernest Navarro had a screening colonoscopy with me in April 2017.  It was a complete exam to the cecum with a good preparation, adequate withdrawal time and no polyps found.  Recommendations were for a 10-year recall.  He denies rectal bleeding, change in bowel habits, no parents or siblings with colorectal cancer.  He thinks some distant relatives like second or third cousins may have had colon cancer Occasional heartburn over the years, denies nausea vomiting and dysphagia or early satiety.  Bilateral knee replacements this year, so his mobility is increased and he is hoping to get more active, start golfing and lose some weight.  ROS:  Review of Systems Denies chest pain dyspnea or dysuria Arthralgias  Past Medical History: Past Medical History:  Diagnosis Date   Arthritis    bilateral knees   Essential hypertension, benign 06/19/2012   Gout 06/19/2012   Headache    Hyperglycemia 06/19/2012   March 2014 A1c 6.5 (repeat needed)    Pneumonia    Pre-diabetes    Sleep apnea      Past Surgical History: Past Surgical History:  Procedure Laterality Date   KNEE SURGERY Right    x3 arthroscopies   TOTAL KNEE ARTHROPLASTY Right 10/28/2020   Procedure: RIGHT TOTAL KNEE ARTHROPLASTY;  Surgeon: Cammy Copa, MD;  Location: [facility];  Service: Orthopedics;  Laterality: Right;   TOTAL KNEE ARTHROPLASTY Left 09/05/2021    Procedure: LEFT TOTAL KNEE ARTHROPLASTY;  Surgeon: Cammy Copa, MD;  Location: [facility] OR;  Service: Orthopedics;  Laterality: Left;     Family History: Family History  Problem Relation Age of Onset   Lymphoma Brother    Diabetes Maternal Grandmother    Colon cancer Neg Hx    Pancreatic cancer Neg Hx    Stomach cancer Neg Hx    Esophageal cancer Neg Hx    Liver disease Neg Hx     Social History: Social History   Socioeconomic History   Marital status: Single    Spouse name: Not on file   Number of children: Not on file   Years of education: Not on file   Highest education level: Not on file  Occupational History   Not on file  Tobacco Use   Smoking status: Never   Smokeless tobacco: Never  Vaping Use   Vaping Use: Never used  Substance and Sexual Activity   Alcohol use: No    Alcohol/week: 0.0 standard drinks of alcohol   Drug use: No   Sexual activity: Not on file  Other Topics Concern   Not on file  Social History Narrative   Not on file   Social Determinants of Health   Financial Resource Strain: Not on file  Food Insecurity: Not on file  Transportation Needs: Not on file  Physical Activity: Not on file  Stress: Not on file  Social Connections: Not on file    Allergies: Allergies  Allergen Reactions   Oxycodone Nausea Only    Makes him "delirious"  Outpatient Meds: Current Outpatient Medications  Medication Sig Dispense Refill   acetaminophen (TYLENOL) 500 MG tablet Take 1,000 mg by mouth every 6 (six) hours as needed for moderate pain.     amLODipine (NORVASC) 10 MG tablet Take 10 mg by mouth daily.     celecoxib (CELEBREX) 100 MG capsule TAKE 1 CAPSULE BY MOUTH TWICE A DAY 60 capsule 0   gabapentin (NEURONTIN) 300 MG capsule Take 1 capsule (300 mg total) by mouth 3 (three) times daily. 60 capsule 0   methocarbamol (ROBAXIN) 500 MG tablet Take 1 tablet (500 mg total) by mouth every 8 (eight) hours as needed for muscle spasms. 30 tablet 0    traMADol (ULTRAM) 50 MG tablet Take 2 tablets (100 mg total) by mouth every 6 (six) hours as needed. 40 tablet 0   No current facility-administered medications for this visit.      ___________________________________________________________________ Objective   Exam:  BP 120/80   Pulse 96   Ht 6' (1.829 m)   Wt 299 lb 12.8 oz (136 kg)   BMI 40.66 kg/m  Wt Readings from Last 3 Encounters:  10/25/21 299 lb 12.8 oz (136 kg)  09/05/21 (!) 307 lb (139.3 kg)  08/30/21 (!) 310 lb 1.6 oz (140.7 kg)    General: Well-appearing, pleasant and conversational.  Antalgic gait, gets on exam table unassisted CV: Regular without murmur, no JVD, no peripheral edema Resp: clear to auscultation bilaterally, normal RR and effort noted GI: soft, no tenderness, with active bowel sounds. No guarding or palpable organomegaly noted.  Limited by obesity   Assessment: Encounter Diagnoses  Name Primary?   Heartburn Yes   Special screening for malignant neoplasms, colon     Intermittent heartburn without red flag signs.  He reports there seem to be occasional food triggers.  Some GERD related diet lifestyle measures are recommended.  Regarding his insurance company's recommendation, it is not clear on what basis they suggested to him that he have a colonoscopy.  Perhaps they had incorrect information regarding his family history or misinterpreted the current screening guidelines. In any event, current guidelines indicate he is not due for a colonoscopy at this time.  I still recommend a 10-year interval as before, he will contact me sooner as the need arises.   Thank you for the courtesy of this consult.  Please call me with any questions or concerns.  [name]  CC: Referring provider noted above

## 2021-10-25 NOTE — Patient Instructions (Addendum)
If you are age 61 or older, your body mass index should be between 23-30. Your Body mass index is 40.66 kg/m. If this is out of the aforementioned range listed, please consider follow up with your Primary Care Provider.  If you are age 38 or younger, your body mass index should be between 19-25. Your Body mass index is 40.66 kg/m. If this is out of the aformentioned range listed, please consider follow up with your Primary Care Provider.   ________________________________________________________  The [facility] providers would like to encourage you to use [facility] to communicate with providers for non-urgent requests or questions.  Due to long hold times on the telephone, sending your provider a message by [facility] may be a faster and more efficient way to get a response.  Please allow 48 business hours for a response.  Please remember that this is for non-urgent requests.  _______________________________________________________  Ernest Navarro will be due for a recall colonoscopy in 07-2025. We will send you a reminder in the mail when it gets closer to that time.   It was a pleasure to see you today!  Thank you for trusting me with your gastrointestinal care!

## 2021-10-26 ENCOUNTER — Telehealth: Payer: Self-pay | Admitting: Orthopedic Surgery

## 2021-10-26 NOTE — Telephone Encounter (Signed)
Noted thanks °

## 2021-10-26 NOTE — Telephone Encounter (Addendum)
Patient called advised he spoke with Trinna Post and told him the CPM machine stopped working 10/18/2021 at 2:00 am.. Patient said he asked Alex to come and get the machine so that he will not be billed for if as it has stopped working. I will also contact Harrold Donath in reference to the machine today     Harrold Donath with [employer]  [phone]    The number to contact patient is [phone] Note: spoke with Harrold Donath he advised patient is not being billed for the machine. He is just on a list for machine pickup. Harrold Donath advised Trinna Post explained this to patient. Patient is not being billed for machine that stopped working 10/18/2021

## 2021-10-27 ENCOUNTER — Encounter: Payer: Self-pay | Admitting: Physical Therapy

## 2021-10-27 ENCOUNTER — Ambulatory Visit: Payer: BC Managed Care – PPO | Admitting: Physical Therapy

## 2021-10-27 DIAGNOSIS — M25662 Stiffness of left knee, not elsewhere classified: Secondary | ICD-10-CM

## 2021-10-27 DIAGNOSIS — R262 Difficulty in walking, not elsewhere classified: Secondary | ICD-10-CM

## 2021-10-27 DIAGNOSIS — M25562 Pain in left knee: Secondary | ICD-10-CM | POA: Diagnosis not present

## 2021-10-27 DIAGNOSIS — M6281 Muscle weakness (generalized): Secondary | ICD-10-CM

## 2021-10-27 NOTE — Therapy (Signed)
OUTPATIENT PHYSICAL THERAPY Treatment Note   Patient Name: Ernest Navarro. MRN: [id number] DOB:Sep 06, 1960, 61 y.o., male Today's Date: 10/27/2021   PT End of Session - 10/27/21 1320     Visit Number 5    Number of Visits 18    Date for PT Re-Evaluation 12/05/21    PT Start Time 1316    PT Stop Time 1403    PT Time Calculation (min) 47 min    Activity Tolerance Patient tolerated treatment well    Behavior During Therapy [facility] for tasks assessed/performed             Past Medical History:  Diagnosis Date   Arthritis    bilateral knees   Essential hypertension, benign 06/19/2012   Gout 06/19/2012   Headache    Hyperglycemia 06/19/2012   March 2014 A1c 6.5 (repeat needed)    Pneumonia    Pre-diabetes    Sleep apnea    Past Surgical History:  Procedure Laterality Date   KNEE SURGERY Right    x3 arthroscopies   TOTAL KNEE ARTHROPLASTY Right 10/28/2020   Procedure: RIGHT TOTAL KNEE ARTHROPLASTY;  Surgeon: Meredith Pel, MD;  Location: [place];  Service: Orthopedics;  Laterality: Right;   TOTAL KNEE ARTHROPLASTY Left 09/05/2021   Procedure: LEFT TOTAL KNEE ARTHROPLASTY;  Surgeon: Meredith Pel, MD;  Location: [place];  Service: Orthopedics;  Laterality: Left;   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   Arthritis of right knee    S/P total knee arthroplasty, right 10/28/2020   Obesity 06/29/2015   Obstructive sleep apnea 05/06/2015   Seasonal and perennial allergic rhinitis 05/06/2015   Hyperglycemia 06/19/2012   Essential hypertension, benign 06/19/2012   Gout 06/19/2012   Nocturia 06/19/2012    PCP: General medical clinic  REFERRING PROVIDER: Meredith Pel MD  REFERRING DIAG: [phone] (ICD-10-CM) - S/P total knee arthroplasty, left  THERAPY DIAG:  Acute pain of left knee  Stiffness of left knee, not elsewhere classified  Muscle weakness  (generalized)  Difficulty in walking, not elsewhere classified  Rationale for Evaluation and Treatment Rehabilitation  ONSET DATE: 09/05/2021  SUBJECTIVE:   SUBJECTIVE STATEMENT: "I think we extended it to much last session."  Knees more swollen, tight, heavy.   PERTINENT HISTORY: S/p L TKA on 09/05/2021, R TKA on 10/28/2020, gout, obesity  PAIN:  Are you having pain? Yes: NPRS scale: 2-3/10 Pain location: L knee  PRECAUTIONS: None  WEIGHT BEARING RESTRICTIONS No  FALLS:  Has patient fallen in last 6 months? No  LIVING ENVIRONMENT: Lives with: lives alone Lives in: House/apartment Stairs: Yes: Internal: 15 steps; on left going up Has following equipment at home: Walker - 2 wheeled and bed side commode  OCCUPATION: works from home, Personnel officer  PLOF: Independent  PATIENT GOALS full ROM in L knee like R - the same strength, flexibility, extension and flexion.   Return to golf.    OBJECTIVE:   DIAGNOSTIC FINDINGS: 09/19/2021 XR L knee: AP and lateral views of left knee reviewed.  Left total knee prosthesis in  good position alignment without any complicating features.  No evidence of loosening noted.  No abnormal patellar height.  No periprosthetic fracture noted.  PATIENT SURVEYS:  LEFS 18/80.  22.5% ability = 77.5% disability  COGNITION:  Overall cognitive status: Within functional limits for tasks assessed     SENSATION: Mild numbness lateral side  L knee  EDEMA:  Circumferential: R 51.5 cm, L 52.5 cm  POSTURE: rounded shoulders and forward head  PALPATION: Tightness along scar  LOWER EXTREMITY ROM:  (A)ctive/(P)assive ROM Right eval Left eval Left 10/12/2021 Left 10/19/2021  Knee flexion 110A, 115 P 98A, 100P 110 P 118  Knee extension 0 5 4 0   (Blank rows = not tested)  LOWER EXTREMITY MMT:  MMT Right eval Left eval  Hip flexion 5 4*  Hip extension    Hip abduction 4+ 4+  Hip adduction 5 5  Knee flexion 5 3+  Knee extension 5 4+    Ankle dorsiflexion 5 4*  Ankle plantarflexion     (Blank rows = not tested) *pain   FUNCTIONAL TESTS:  5 times sit to stand: 20 seconds without UE assist  GAIT: Distance walked: 50 Assistive device utilized: Walker - 2 wheeled Level of assistance: Modified independence Comments:  slow gait, reciprocal stepping, slightly antalgic, 0.48 m/s    TODAY'S TREATMENT: 10/27/2021 Therapeutic Exercise: to improve strength and mobility.  Demo, verbal and tactile cues throughout for technique. Nustep L5 x 6 min  Gait with SPC x 300' Standing stretches at wall 2 x 30 sec bil  S/L hip abduction 2 x 10 bil S/l clams 2 x 10 bil  Prone leg extensions 2 x 10 bil  Supine SLR 2 x 10 bil  Bridges x 10 Vasopneumatic: Gameready at end of session for post session soreness/edema.  10 min, low compression, 34 deg (coldest).  10/25/2021 Therapeutic Exercise: to improve strength and mobility.  Bike L2 x 7 min  Step ups (1 riser) 2 x 10 bil  On regular step - step ups x 10 LLE only  Stairs - step to gait with HR and SPC, SBA for safety In supine LAQ 2 x 10 L Bridges 2 x 10  Manual Therapy: to decrease muscle spasm and pain and improve mobility.  IASTM to L medial quad with s/s tools, scar tissue mobilization, patellar mobs Vasopneumatic: Gameready at end of session for post session soreness/edema.  10 min, low compression, 34 deg (coldest).   10/19/2021 Therapeutic Exercise: to improve strength and mobility.  Demo, verbal and tactile cues throughout for technique. Bike (seat 11) L1 x 8 min  Gait x 200' with SPC, cues for heel strike Step ups (no riser) 2 x 10 bil 2 UE support with step up on L Step down - no riser x 10 bil - 1UE support stepping down on R, switched to dips due to pain.  Sit to stands x 10  Church pews 3 x 10  Seated LAQ x 10 with heel pump - no quad lag In prone- hip extension 2 x 10 bil Prone Hamstring curls L x 10 Prone hip extension with leg bent x 10 L Contract relax quad 5 x  5 sec hold isometric strengthening.  Manual Therapy: to improve ROM In prone with L hip extended - passive ROM flexion with rotational movements to relax glutes, contract relax 5 x 5 sec to quads Vasopneumatic: Gameready at end of session for post session soreness/edema to LLE.  15 min, low compression, 34 deg (coldest).   10/12/2021 Therapeutic Exercise: to improve strength and mobility.  Demo, verbal and tactile cues throughout for technique. Bike (seat 12) L1 x 6 min At counter for safety: Heel raises x 15 Toe raises x 15 Hip abduction x 10 bil  Hip extension x 10 bil  Hamstring curls x 10 bil  Retro  steps x 10 bil (with contralateral arm raise)  Seated hip flexor stretch (legs 90/90) Prone - L leg extension x 10 bil, hamstring isometrics 5 x 5 sec hold Manual Therapy: to improve ROM In prone with L hip extended - passive ROM flexion with rotational movements to relax glutes, contract relax 5 x 5 sec to quads, able to bend knee 90 deg in prone afterwards.  Patellar mobs, scar tissue mobilization.  Vasopneumatic: Gameready at end of session for post session soreness/edema to LLE.  15 min, low compression, 34 deg (coldest).      PATIENT EDUCATION:  Education details: continue HEP Person educated: Patient Education method: Customer service manager Education comprehension: verbalized understanding   HOME EXERCISE PROGRAM: Access Code: [email or web address]   ASSESSMENT:  CLINICAL IMPRESSION: [name]. Reported increased soreness and swelling following last session, so focused interventions today on stretches for ankle mobility and exercises for hip strengthening followed by Game ready at end to address post session soreness and edema.   Tolerated session well although reported fatigue in glutes with clamshells. [name]. continues to demonstrate potential for improvement and would benefit from continued skilled therapy to address impairments.      OBJECTIVE  IMPAIRMENTS Abnormal gait, decreased balance, decreased endurance, decreased mobility, difficulty walking, decreased ROM, decreased strength, increased edema, increased fascial restrictions, increased muscle spasms, impaired flexibility, impaired sensation, and pain.   ACTIVITY LIMITATIONS carrying, lifting, bending, sitting, standing, squatting, stairs, transfers, and locomotion level  PARTICIPATION LIMITATIONS: meal prep, cleaning, laundry, driving, shopping, and community activity  PERSONAL FACTORS 1-2 comorbidities: R TKA, obesity, gout  are also affecting patient's functional outcome.   REHAB POTENTIAL: Excellent  CLINICAL DECISION MAKING: Stable/uncomplicated  EVALUATION COMPLEXITY: Low   GOALS: Goals reviewed with patient? Yes   SHORT TERM GOALS: Target date: 10/24/2021    Independent with initial HEP. Baseline: independent with current HEP.   Goal status: MET   LONG TERM GOALS: Target date: 12/05/2021   Independent with advanced/ongoing HEP to improve outcomes and carryover.  Baseline: needs progression.  Goal status: IN PROGRESS  2.  [name]. will demonstrate left knee flexion to 115 deg to ascend/descend stairs. Baseline: 100 Goal status: MET  10/19/2021 -118  3.  [name]. will demonstrate full left knee extension for safety with gait. Baseline: lacking 5 deg due to edema Goal status: IN PROGRESS  4.  [name]. will be able to ambulate 600' safely with LRAD and normal gait pattern to access community.  Baseline: slow antalgic with 2WRW Goal status: IN PROGRESS  5.  [name]. will be able to ascend/descend stairs with 1 HR and reciprocal step pattern safely to access home and community.  Baseline: unable Goal status: IN PROGRESS  6.  Norvin Ohlin. will demonstrate > 19/24 on DGI to demonstrate decreased risk of falls.   Baseline: to be assessed Goal status: IN PROGRESS  7.  [name]. will demonstrate  improved functional LE strength by completing 5x STS in <15 seconds.  (MCID 5 seconds) Baseline: 20 seconds  Goal status: IN PROGRESS  8.  [name]. will demonstrate >30/80 on LEFS to demonstrate improved mobility.  Baseline: 18/80 = 77.5% disability  Goal status: IN PROGRESS  9.  [name]. will demonstrate 5/5 LLE strength without pain for safety with gait.  Baseline: see objective Goal status: IN PROGRESS    PLAN: PT FREQUENCY: 2-3x/week  PT DURATION: 8 weeks  PLANNED INTERVENTIONS: Therapeutic exercises, Therapeutic activity, Neuromuscular re-education, Balance training, Gait training, Patient/Family education, Joint mobilization, Stair training, Aquatic Therapy, Dry Needling, Electrical stimulation, Cryotherapy, Moist heat, scar mobilization, Taping, Vasopneumatic device, Ultrasound, Ionotophoresis 48m/ml Dexamethasone, Manual therapy, and Re-evaluation  PLAN FOR NEXT SESSION:  continue to progress exercises as tolerated, especially stairs, manual therapy focusing on ROM, modalities PRN   ERennie Natter PT, DPT  10/27/2021, 3:52 PM

## 2021-10-31 ENCOUNTER — Encounter: Payer: Self-pay | Admitting: Orthopedic Surgery

## 2021-10-31 ENCOUNTER — Ambulatory Visit (HOSPITAL_COMMUNITY)
Admission: RE | Admit: 2021-10-31 | Discharge: 2021-10-31 | Disposition: A | Discharge status: Home / Self Care | Payer: BC Managed Care – PPO | Source: Ambulatory Visit | Attending: Orthopedic Surgery | Admitting: Orthopedic Surgery

## 2021-10-31 ENCOUNTER — Ambulatory Visit: Payer: BC Managed Care – PPO | Admitting: Physical Therapy

## 2021-10-31 ENCOUNTER — Ambulatory Visit (INDEPENDENT_AMBULATORY_CARE_PROVIDER_SITE_OTHER): Payer: BC Managed Care – PPO | Admitting: Orthopedic Surgery

## 2021-10-31 DIAGNOSIS — Z96652 Presence of left artificial knee joint: Secondary | ICD-10-CM | POA: Diagnosis present

## 2021-10-31 NOTE — Progress Notes (Signed)
   Post-Op Visit Note   Patient: Ernest Navarro.           Date of Birth: 05/19/1960           MRN: [id number] Visit Date: 10/31/2021 PCP: Clinic, General Medical   Assessment & Plan:  Chief Complaint:  Chief Complaint  Patient presents with   Left Leg - Pain   Visit Diagnoses:  1. S/P total knee arthroplasty, left     Plan: [name] is a 61 year old patient with left leg swelling around the mid tibial shaft region medially.  Had left total knee replacement 6/23.  Describes both pain and numbness and tingling.  Takes aspirin for DVT prophylaxis.  On examination he does have a little bit of focal swelling in the mid tibial shaft region.  Just on that medial crest.  Negative Homans.  Knee range of motion otherwise is excellent with no warmth.  Plan at this time is ultrasound to rule out DVT.  At the time of this dictation that study was negative.  Continue with rehab as tolerated and follow-up in 6 weeks for clinical recheck.  Follow-Up Instructions: No follow-ups on file.   Orders:  Orders Placed This Encounter  Procedures   VAS [place] LOWER EXTREMITY VENOUS (DVT)   No orders of the defined types were placed in this encounter.   Imaging: No results found.  PMFS History: 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   Arthritis of right knee    S/P total knee arthroplasty, right 10/28/2020   Obesity 06/29/2015   Obstructive sleep apnea 05/06/2015   Seasonal and perennial allergic rhinitis 05/06/2015   Hyperglycemia 06/19/2012   Essential hypertension, benign 06/19/2012   Gout 06/19/2012   Nocturia 06/19/2012   Past Medical History:  Diagnosis Date   Arthritis    bilateral knees   Essential hypertension, benign 06/19/2012   Gout 06/19/2012   Headache    Hyperglycemia 06/19/2012   March 2014 A1c 6.5 (repeat needed)    Pneumonia    Pre-diabetes    Sleep apnea     Family History   Problem Relation Age of Onset   Lymphoma Brother    Diabetes Maternal Grandmother    Colon cancer Neg Hx    Pancreatic cancer Neg Hx    Stomach cancer Neg Hx    Esophageal cancer Neg Hx    Liver disease Neg Hx     Past Surgical History:  Procedure Laterality Date   KNEE SURGERY Right    x3 arthroscopies   TOTAL KNEE ARTHROPLASTY Right 10/28/2020   Procedure: RIGHT TOTAL KNEE ARTHROPLASTY;  Surgeon: Cammy Copa, MD;  Location: [facility];  Service: Orthopedics;  Laterality: Right;   TOTAL KNEE ARTHROPLASTY Left 09/05/2021   Procedure: LEFT TOTAL KNEE ARTHROPLASTY;  Surgeon: Cammy Copa, MD;  Location[facility];  Service: Orthopedics;  Laterality: Left;   Social History   Occupational History   Not on file  Tobacco Use   Smoking status: Never   Smokeless tobacco: Never  Vaping Use   Vaping Use: Never used  Substance and Sexual Activity   Alcohol use: No    Alcohol/week: 0.0 standard drinks of alcohol   Drug use: No   Sexual activity: Not on file

## 2021-10-31 NOTE — Progress Notes (Signed)
Left LE venous duplex study completed. Please see CV Proc for preliminary results.  Gredmarie Delange BS, RVT 10/31/2021 2:54 PM

## 2021-11-01 ENCOUNTER — Ambulatory Visit: Payer: BC Managed Care – PPO

## 2021-11-03 ENCOUNTER — Ambulatory Visit: Payer: BC Managed Care – PPO | Attending: Orthopedic Surgery

## 2021-11-03 DIAGNOSIS — M25562 Pain in left knee: Secondary | ICD-10-CM | POA: Diagnosis present

## 2021-11-03 DIAGNOSIS — M25662 Stiffness of left knee, not elsewhere classified: Secondary | ICD-10-CM | POA: Diagnosis present

## 2021-11-03 DIAGNOSIS — M6281 Muscle weakness (generalized): Secondary | ICD-10-CM | POA: Diagnosis present

## 2021-11-03 DIAGNOSIS — R262 Difficulty in walking, not elsewhere classified: Secondary | ICD-10-CM | POA: Diagnosis present

## 2021-11-03 NOTE — Therapy (Signed)
OUTPATIENT PHYSICAL THERAPY Treatment Note   Patient Name: Ernest Navarro. MRN: [id number] DOB:[birth year], 61 y.o., male Today's Date: 11/03/2021   PT End of Session - 11/03/21 1444     Visit Number 6    Number of Visits 18    Date for PT Re-Evaluation 12/05/21    PT Start Time 0071    PT Stop Time 2197    PT Time Calculation (min) 50 min    Activity Tolerance Patient tolerated treatment well    Behavior During Therapy [facility] for tasks assessed/performed              Past Medical History:  Diagnosis Date   Arthritis    bilateral knees   Essential hypertension, benign 06/19/2012   Gout 06/19/2012   Headache    Hyperglycemia 06/19/2012   March 2014 A1c 6.5 (repeat needed)    Pneumonia    Pre-diabetes    Sleep apnea    Past Surgical History:  Procedure Laterality Date   KNEE SURGERY Right    x3 arthroscopies   TOTAL KNEE ARTHROPLASTY Right 10/28/2020   Procedure: RIGHT TOTAL KNEE ARTHROPLASTY;  Surgeon: Meredith Pel, MD;  Location: [place];  Service: Orthopedics;  Laterality: Right;   TOTAL KNEE ARTHROPLASTY Left 09/05/2021   Procedure: LEFT TOTAL KNEE ARTHROPLASTY;  Surgeon: Meredith Pel, MD;  Location: [place];  Service: Orthopedics;  Laterality: Left;   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   Arthritis of right knee    S/P total knee arthroplasty, right 10/28/2020   Obesity 06/29/2015   Obstructive sleep apnea 05/06/2015   Seasonal and perennial allergic rhinitis 05/06/2015   Hyperglycemia 06/19/2012   Essential hypertension, benign 06/19/2012   Gout 06/19/2012   Nocturia 06/19/2012    PCP: General medical clinic  REFERRING PROVIDER: Meredith Pel MD  REFERRING DIAG: [phone] (ICD-10-CM) - S/P total knee arthroplasty, left  THERAPY DIAG:  Acute pain of left knee  Stiffness of left knee, not elsewhere classified  Muscle weakness  (generalized)  Difficulty in walking, not elsewhere classified  Rationale for Evaluation and Treatment Rehabilitation  ONSET DATE: 09/05/2021  SUBJECTIVE:   SUBJECTIVE STATEMENT: Having more pain today but doctors visit went well  PERTINENT HISTORY: S/p L TKA on 09/05/2021, R TKA on 10/28/2020, gout, obesity  PAIN:  Are you having pain? Yes: NPRS scale: 5/10 Pain location: L knee  PRECAUTIONS: None  WEIGHT BEARING RESTRICTIONS No  FALLS:  Has patient fallen in last 6 months? No  LIVING ENVIRONMENT: Lives with: lives alone Lives in: House/apartment Stairs: Yes: Internal: 15 steps; on left going up Has following equipment at home: Walker - 2 wheeled and bed side commode  OCCUPATION: works from home, Personnel officer  PLOF: Independent  PATIENT GOALS full ROM in L knee like R - the same strength, flexibility, extension and flexion.   Return to golf.    OBJECTIVE:   DIAGNOSTIC FINDINGS: 09/19/2021 XR L knee: AP and lateral views of left knee reviewed.  Left total knee prosthesis in  good position alignment without any complicating features.  No evidence of loosening noted.  No abnormal patellar height.  No periprosthetic fracture noted.  PATIENT SURVEYS:  LEFS 18/80.  22.5% ability = 77.5% disability  COGNITION:  Overall cognitive status: Within functional limits for tasks assessed     SENSATION: Mild numbness lateral side L knee  EDEMA:  Circumferential:  R 51.5 cm, L 52.5 cm  POSTURE: rounded shoulders and forward head  PALPATION: Tightness along scar  LOWER EXTREMITY ROM:  (A)ctive/(P)assive ROM Right eval Left eval Left 10/12/2021 Left 10/19/2021 Left 11/03/21  Knee flexion 110A, 115 P 98A, 100P 110 P 118 119  Knee extension 0 5 4 0 0   (Blank rows = not tested)  LOWER EXTREMITY MMT:  MMT Right eval Left eval  Hip flexion 5 4*  Hip extension    Hip abduction 4+ 4+  Hip adduction 5 5  Knee flexion 5 3+  Knee extension 5 4+   Ankle  dorsiflexion 5 4*  Ankle plantarflexion     (Blank rows = not tested) *pain   FUNCTIONAL TESTS:  5 times sit to stand: 20 seconds without UE assist  GAIT: Distance walked: 50 Assistive device utilized: Environmental consultant - 2 wheeled Level of assistance: Modified independence Comments:  slow gait, reciprocal stepping, slightly antalgic, 0.48 m/s    TODAY'S TREATMENT: 11/03/21 Therapeutic Exercise: Rec Bike L2x59mn Fwd/lat step ups x 10 each LLE - 6' Mini squats 10x3" Knee flexion 15# BLE x 10; 5# x 10 LLE Knee extension 5# LLE x 10 Supine quad stretch w/ strap 2x30"  Manual Therapy: PROM to L knee flex + ext  Vasopneumatic: Gameready at end of session for post session soreness/edema.  10 min, low compression, 34 deg (coldest).  10/27/2021 Therapeutic Exercise: to improve strength and mobility.  Demo, verbal and tactile cues throughout for technique. Nustep L5 x 6 min  Gait with SPC x 300' Standing stretches at wall 2 x 30 sec bil  S/L hip abduction 2 x 10 bil S/l clams 2 x 10 bil  Prone leg extensions 2 x 10 bil  Supine SLR 2 x 10 bil  Bridges x 10 Vasopneumatic: Gameready at end of session for post session soreness/edema.  10 min, low compression, 34 deg (coldest).  10/25/2021 Therapeutic Exercise: to improve strength and mobility.  Bike L2 x 7 min  Step ups (1 riser) 2 x 10 bil  On regular step - step ups x 10 LLE only  Stairs - step to gait with HR and SPC, SBA for safety In supine LAQ 2 x 10 L Bridges 2 x 10  Manual Therapy: to decrease muscle spasm and pain and improve mobility.  IASTM to L medial quad with s/s tools, scar tissue mobilization, patellar mobs Vasopneumatic: Gameready at end of session for post session soreness/edema.  10 min, low compression, 34 deg (coldest).       PATIENT EDUCATION:  Education details: continue HEP Person educated: Patient Education method: ECustomer service managerEducation comprehension: verbalized understanding   HOME  EXERCISE PROGRAM: Access Code: [email or web address]   ASSESSMENT:  CLINICAL IMPRESSION: Able to progress exercises to tolerance. Continued working on knee strengthening to improve strength and mobility. Cues provided throughout session for form and to target the correct muscles. Pt has met LTG#5 today and progressing well with knee ROM.    OBJECTIVE IMPAIRMENTS Abnormal gait, decreased balance, decreased endurance, decreased mobility, difficulty walking, decreased ROM, decreased strength, increased edema, increased fascial restrictions, increased muscle spasms, impaired flexibility, impaired sensation, and pain.   ACTIVITY LIMITATIONS carrying, lifting, bending, sitting, standing, squatting, stairs, transfers, and locomotion level  PARTICIPATION LIMITATIONS: meal prep, cleaning, laundry, driving, shopping, and community activity  PERSONAL FACTORS 1-2 comorbidities: R TKA, obesity, gout  are also affecting patient's functional outcome.   REHAB POTENTIAL: Excellent  CLINICAL DECISION MAKING: Stable/uncomplicated  EVALUATION COMPLEXITY: Low  GOALS: Goals reviewed with patient? Yes   SHORT TERM GOALS: Target date: 10/24/2021    Independent with initial HEP. Baseline: independent with current HEP.   Goal status: MET   LONG TERM GOALS: Target date: 12/05/2021   Independent with advanced/ongoing HEP to improve outcomes and carryover.  Baseline: needs progression.  Goal status: IN PROGRESS  2.  [name]. will demonstrate left knee flexion to 115 deg to ascend/descend stairs. Baseline: 100 Goal status: MET  10/19/2021 -118  3.  [name]. will demonstrate full left knee extension for safety with gait. Baseline: lacking 5 deg due to edema Goal status: MET  4.  [name]. will be able to ambulate 600' safely with LRAD and normal gait pattern to access community.  Baseline: slow antalgic with 2WRW Goal status: IN PROGRESS  5.  [name]. will be able to  ascend/descend stairs with 1 HR and reciprocal step pattern safely to access home and community.  Baseline: unable Goal status: IN PROGRESS  6.  Adar Rase. will demonstrate > 19/24 on DGI to demonstrate decreased risk of falls.   Baseline: to be assessed Goal status: IN PROGRESS  7.  [name]. will demonstrate improved functional LE strength by completing 5x STS in <15 seconds.  (MCID 5 seconds) Baseline: 20 seconds  Goal status: IN PROGRESS  8.  [name]. will demonstrate >30/80 on LEFS to demonstrate improved mobility.  Baseline: 18/80 = 77.5% disability  Goal status: IN PROGRESS  9.  [name]. will demonstrate 5/5 LLE strength without pain for safety with gait.  Baseline: see objective Goal status: IN PROGRESS    PLAN: PT FREQUENCY: 2-3x/week  PT DURATION: 8 weeks  PLANNED INTERVENTIONS: Therapeutic exercises, Therapeutic activity, Neuromuscular re-education, Balance training, Gait training, Patient/Family education, Joint mobilization, Stair training, Aquatic Therapy, Dry Needling, Electrical stimulation, Cryotherapy, Moist heat, scar mobilization, Taping, Vasopneumatic device, Ultrasound, Ionotophoresis 62m/ml Dexamethasone, Manual therapy, and Re-evaluation  PLAN FOR NEXT SESSION:  continue to progress exercises as tolerated, especially stairs, manual therapy focusing on ROM, modalities PRN   BArtist Pais PTA 11/03/2021, 2:44 PM

## 2021-11-04 ENCOUNTER — Other Ambulatory Visit: Payer: Self-pay | Admitting: Orthopedic Surgery

## 2021-11-04 ENCOUNTER — Telehealth: Payer: Self-pay | Admitting: Orthopedic Surgery

## 2021-11-04 NOTE — Telephone Encounter (Signed)
Patient called needing Rx refilled Tramadol. Patient said he is out of his medication. The number to contact patient is [phone]

## 2021-11-05 ENCOUNTER — Other Ambulatory Visit: Payer: Self-pay | Admitting: Surgical

## 2021-11-05 MED ORDER — TRAMADOL HCL 50 MG PO TABS: 100.0000 mg | ORAL_TABLET | Freq: Four times a day (QID) | ORAL | 0 refills | Status: DC | PRN

## 2021-11-05 NOTE — Telephone Encounter (Signed)
Sent in refill and informed the patient

## 2021-11-07 ENCOUNTER — Ambulatory Visit: Payer: BC Managed Care – PPO | Admitting: Physical Therapy

## 2021-11-07 ENCOUNTER — Encounter: Payer: Self-pay | Admitting: Physical Therapy

## 2021-11-07 DIAGNOSIS — R262 Difficulty in walking, not elsewhere classified: Secondary | ICD-10-CM

## 2021-11-07 DIAGNOSIS — M25562 Pain in left knee: Secondary | ICD-10-CM

## 2021-11-07 DIAGNOSIS — M6281 Muscle weakness (generalized): Secondary | ICD-10-CM

## 2021-11-07 DIAGNOSIS — M25662 Stiffness of left knee, not elsewhere classified: Secondary | ICD-10-CM

## 2021-11-07 NOTE — Therapy (Signed)
OUTPATIENT PHYSICAL THERAPY Treatment Note   Patient Name: Ernest Navarro. MRN: [id number] DOB:1960/07/20, 61 y.o., male Today's Date: 11/07/2021   PT End of Session - 11/07/21 1316     Visit Number 7    Number of Visits 18    Date for PT Re-Evaluation 12/05/21    PT Start Time 7588    PT Stop Time 1410    PT Time Calculation (min) 55 min    Activity Tolerance Patient tolerated treatment well    Behavior During Therapy [facility] for tasks assessed/performed              Past Medical History:  Diagnosis Date   Arthritis    bilateral knees   Essential hypertension, benign 06/19/2012   Gout 06/19/2012   Headache    Hyperglycemia 06/19/2012   March 2014 A1c 6.5 (repeat needed)    Pneumonia    Pre-diabetes    Sleep apnea    Past Surgical History:  Procedure Laterality Date   KNEE SURGERY Right    x3 arthroscopies   TOTAL KNEE ARTHROPLASTY Right 10/28/2020   Procedure: RIGHT TOTAL KNEE ARTHROPLASTY;  Surgeon: Meredith Pel, MD;  Location: [place];  Service: Orthopedics;  Laterality: Right;   TOTAL KNEE ARTHROPLASTY Left 09/05/2021   Procedure: LEFT TOTAL KNEE ARTHROPLASTY;  Surgeon: Meredith Pel, MD;  Location: [place];  Service: Orthopedics;  Laterality: Left;   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   Arthritis of right knee    S/P total knee arthroplasty, right 10/28/2020   Obesity 06/29/2015   Obstructive sleep apnea 05/06/2015   Seasonal and perennial allergic rhinitis 05/06/2015   Hyperglycemia 06/19/2012   Essential hypertension, benign 06/19/2012   Gout 06/19/2012   Nocturia 06/19/2012    PCP: General medical clinic  REFERRING PROVIDER: Meredith Pel MD  REFERRING DIAG: [phone] (ICD-10-CM) - S/P total knee arthroplasty, left  THERAPY DIAG:  Acute pain of left knee  Stiffness of left knee, not elsewhere classified  Muscle weakness  (generalized)  Difficulty in walking, not elsewhere classified  Rationale for Evaluation and Treatment Rehabilitation  ONSET DATE: 09/05/2021  SUBJECTIVE:   SUBJECTIVE STATEMENT: Pt. Reports couple days to recover after PT, deep ache today.   No new concerns reported.    PERTINENT HISTORY: S/p L TKA on 09/05/2021, R TKA on 10/28/2020, gout, obesity  PAIN:  Are you having pain? Yes: NPRS scale: 4.5/10 Pain location: L knee  PRECAUTIONS: None  WEIGHT BEARING RESTRICTIONS No  FALLS:  Has patient fallen in last 6 months? No  LIVING ENVIRONMENT: Lives with: lives alone Lives in: House/apartment Stairs: Yes: Internal: 15 steps; on left going up Has following equipment at home: Walker - 2 wheeled and bed side commode  OCCUPATION: works from home, Personnel officer  PLOF: Independent  PATIENT GOALS full ROM in L knee like R - the same strength, flexibility, extension and flexion.   Return to golf.    OBJECTIVE:   DIAGNOSTIC FINDINGS: 09/19/2021 XR L knee: AP and lateral views of left knee reviewed.  Left total knee prosthesis in  good position alignment without any complicating features.  No evidence of loosening noted.  No abnormal patellar height.  No periprosthetic fracture noted.  PATIENT SURVEYS:  LEFS 18/80.  22.5% ability = 77.5% disability  COGNITION:  Overall cognitive status: Within functional limits for tasks assessed     SENSATION:  Mild numbness lateral side L knee  EDEMA:  Circumferential: R 51.5 cm, L 52.5 cm  POSTURE: rounded shoulders and forward head  PALPATION: Tightness along scar  LOWER EXTREMITY ROM:  (A)ctive/(P)assive ROM Right eval Left eval Left 10/12/2021 Left 10/19/2021 Left 11/03/21  Knee flexion 110A, 115 P 98A, 100P 110 P 118 119  Knee extension 0 5 4 0 0   (Blank rows = not tested)  LOWER EXTREMITY MMT:  MMT Right eval Left eval  Hip flexion 5 4*  Hip extension    Hip abduction 4+ 4+  Hip adduction 5 5  Knee flexion 5  3+  Knee extension 5 4+   Ankle dorsiflexion 5 4*  Ankle plantarflexion     (Blank rows = not tested) *pain   FUNCTIONAL TESTS:  5 times sit to stand: 20 seconds without UE assist  GAIT: Distance walked: 50 Assistive device utilized: Walker - 2 wheeled Level of assistance: Modified independence Comments:  slow gait, reciprocal stepping, slightly antalgic, 0.48 m/s    TODAY'S TREATMENT: 11/07/2021 Therapeutic Exercise: to improve strength and mobility.   Bike L2 x 6 min seat 12 Step ups (1 riser) 2 x 10 bil  Side step ups (1 riser) 2 x 10 bil  Eccentric heel raises on step x 10 Single leg RDLS 2 x 10 R/L - counter support.  Standing runners stretch Leg extension with 3# leg weight 2 x 10 bil  Cybex knee flexion 15# 2 x 10 LLE Cybex knee extension 10# 2 x 10 LLE Vasopneumatic: Gameready at end of session for post session soreness/edema.  10 min, low compression, 34 deg (coldest).  11/03/21 Therapeutic Exercise: Rec Bike L2x98mn Fwd/lat step ups x 10 each LLE - 6' Mini squats 10x3" Knee flexion 15# BLE x 10; 5# x 10 LLE Knee extension 5# LLE x 10 Supine quad stretch w/ strap 2x30"  Manual Therapy: PROM to L knee flex + ext  Vasopneumatic: Gameready at end of session for post session soreness/edema.  10 min, low compression, 34 deg (coldest).  10/27/2021 Therapeutic Exercise: to improve strength and mobility.  Demo, verbal and tactile cues throughout for technique. Nustep L5 x 6 min  Gait with SPC x 300' Standing stretches at wall 2 x 30 sec bil  S/L hip abduction 2 x 10 bil S/l clams 2 x 10 bil  Prone leg extensions 2 x 10 bil  Supine SLR 2 x 10 bil  Bridges x 10 Vasopneumatic: Gameready at end of session for post session soreness/edema.  10 min, low compression, 34 deg (coldest).  10/25/2021 Therapeutic Exercise: to improve strength and mobility.  Bike L2 x 7 min  Step ups (1 riser) 2 x 10 bil  On regular step - step ups x 10 LLE only  Stairs - step to gait with HR  and SPC, SBA for safety In supine LAQ 2 x 10 L Bridges 2 x 10  Manual Therapy: to decrease muscle spasm and pain and improve mobility.  IASTM to L medial quad with s/s tools, scar tissue mobilization, patellar mobs Vasopneumatic: Gameready at end of session for post session soreness/edema.  10 min, low compression, 34 deg (coldest).       PATIENT EDUCATION:  Education details: continue HEP Person educated: Patient Education method: ECustomer service managerEducation comprehension: verbalized understanding   HOME EXERCISE PROGRAM: Access Code: [email or web address]   ASSESSMENT:  CLINICAL IMPRESSION: FMyles Navarro Is making good progress with ROM but continues to report L knee soreness/pain.  Focus of today's skilled therapy was continuing to progress LLE strengthening to improve gait and activity tolerance.  Tolerated exercises well although short seated rest breaks needed with step ups.  Game ready at end of session to decrease post session pain and edema.  Ernest Navarro. continues to demonstrate potential for improvement and would benefit from continued skilled therapy to address impairments.    OBJECTIVE IMPAIRMENTS Abnormal gait, decreased balance, decreased endurance, decreased mobility, difficulty walking, decreased ROM, decreased strength, increased edema, increased fascial restrictions, increased muscle spasms, impaired flexibility, impaired sensation, and pain.   ACTIVITY LIMITATIONS carrying, lifting, bending, sitting, standing, squatting, stairs, transfers, and locomotion level  PARTICIPATION LIMITATIONS: meal prep, cleaning, laundry, driving, shopping, and community activity  PERSONAL FACTORS 1-2 comorbidities: R TKA, obesity, gout  are also affecting patient's functional outcome.   REHAB POTENTIAL: Excellent  CLINICAL DECISION MAKING: Stable/uncomplicated  EVALUATION COMPLEXITY: Low   GOALS: Goals reviewed with patient? Yes   SHORT TERM GOALS: Target date:  10/24/2021    Independent with initial HEP. Baseline: independent with current HEP.   Goal status: MET   LONG TERM GOALS: Target date: 12/05/2021   Independent with advanced/ongoing HEP to improve outcomes and carryover.  Baseline: needs progression.  Goal status: IN PROGRESS  2.  Ernest Navarro. will demonstrate left knee flexion to 115 deg to ascend/descend stairs. Baseline: 100 Goal status: MET  10/19/2021 -118  3.  Ernest Navarro. will demonstrate full left knee extension for safety with gait. Baseline: lacking 5 deg due to edema Goal status: MET  4.  Ernest Navarro. will be able to ambulate 600' safely with LRAD and normal gait pattern to access community.  Baseline: slow antalgic with 2WRW Goal status: IN PROGRESS  5.  Ernest Navarro. will be able to ascend/descend stairs with 1 HR and reciprocal step pattern safely to access home and community.  Baseline: unable Goal status: IN PROGRESS  6.  Elvyn Krohn. will demonstrate > 19/24 on DGI to demonstrate decreased risk of falls.   Baseline: to be assessed Goal status: IN PROGRESS  7.  Ernest Navarro. will demonstrate improved functional LE strength by completing 5x STS in <15 seconds.  (MCID 5 seconds) Baseline: 20 seconds  Goal status: IN PROGRESS  8.  Ernest Navarro. will demonstrate >30/80 on LEFS to demonstrate improved mobility.  Baseline: 18/80 = 77.5% disability  Goal status: IN PROGRESS  9.  Ernest Navarro. will demonstrate 5/5 LLE strength without pain for safety with gait.  Baseline: see objective Goal status: IN PROGRESS    PLAN: PT FREQUENCY: 2-3x/week  PT DURATION: 8 weeks  PLANNED INTERVENTIONS: Therapeutic exercises, Therapeutic activity, Neuromuscular re-education, Balance training, Gait training, Patient/Family education, Joint mobilization, Stair training, Aquatic Therapy, Dry Needling, Electrical stimulation, Cryotherapy, Moist heat, scar mobilization, Taping,  Vasopneumatic device, Ultrasound, Ionotophoresis 24m/ml Dexamethasone, Manual therapy, and Re-evaluation  PLAN FOR NEXT SESSION:  continue to progress exercises as tolerated, especially stairs, manual therapy focusing on ROM, modalities PRN   ERennie Natter PT, DPT  11/07/2021, 6:24 PM

## 2021-11-08 MED ORDER — TRAMADOL HCL 50 MG PO TABS: 100.0000 mg | ORAL_TABLET | Freq: Four times a day (QID) | ORAL | 0 refills | Status: DC | PRN

## 2021-11-09 NOTE — Telephone Encounter (Signed)
thx

## 2021-11-09 NOTE — Telephone Encounter (Signed)
Sent in refill to be filled earliest tomorrow

## 2021-11-10 ENCOUNTER — Encounter: Payer: Self-pay | Admitting: Physical Therapy

## 2021-11-10 ENCOUNTER — Ambulatory Visit: Payer: BC Managed Care – PPO | Admitting: Physical Therapy

## 2021-11-10 DIAGNOSIS — M25562 Pain in left knee: Secondary | ICD-10-CM

## 2021-11-10 DIAGNOSIS — R262 Difficulty in walking, not elsewhere classified: Secondary | ICD-10-CM

## 2021-11-10 DIAGNOSIS — M25662 Stiffness of left knee, not elsewhere classified: Secondary | ICD-10-CM

## 2021-11-10 DIAGNOSIS — M6281 Muscle weakness (generalized): Secondary | ICD-10-CM

## 2021-11-10 NOTE — Therapy (Signed)
OUTPATIENT PHYSICAL THERAPY Treatment Note   Patient Name: Ernest Navarro. MRN: [id number] DOB:Mar 21, 1961, 61 y.o., male Today's Date: 11/10/2021   PT End of Session - 11/10/21 1318     Visit Number 8    Number of Visits 18    Date for PT Re-Evaluation 12/05/21    PT Start Time 1315    PT Stop Time 1407    PT Time Calculation (min) 52 min    Activity Tolerance Patient tolerated treatment well    Behavior During Therapy [facility] for tasks assessed/performed              Past Medical History:  Diagnosis Date   Arthritis    bilateral knees   Essential hypertension, benign 06/19/2012   Gout 06/19/2012   Headache    Hyperglycemia 06/19/2012   March 2014 A1c 6.5 (repeat needed)    Pneumonia    Pre-diabetes    Sleep apnea    Past Surgical History:  Procedure Laterality Date   KNEE SURGERY Right    x3 arthroscopies   TOTAL KNEE ARTHROPLASTY Right 10/28/2020   Procedure: RIGHT TOTAL KNEE ARTHROPLASTY;  Surgeon: Meredith Pel, MD;  Location: [place];  Service: Orthopedics;  Laterality: Right;   TOTAL KNEE ARTHROPLASTY Left 09/05/2021   Procedure: LEFT TOTAL KNEE ARTHROPLASTY;  Surgeon: Meredith Pel, MD;  Location: [place];  Service: Orthopedics;  Laterality: Left;   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   Arthritis of right knee    S/P total knee arthroplasty, right 10/28/2020   Obesity 06/29/2015   Obstructive sleep apnea 05/06/2015   Seasonal and perennial allergic rhinitis 05/06/2015   Hyperglycemia 06/19/2012   Essential hypertension, benign 06/19/2012   Gout 06/19/2012   Nocturia 06/19/2012    PCP: General medical clinic  REFERRING PROVIDER: Meredith Pel MD  REFERRING DIAG: [phone] (ICD-10-CM) - S/P total knee arthroplasty, left  THERAPY DIAG:  Acute pain of left knee  Stiffness of left knee, not elsewhere classified  Muscle weakness  (generalized)  Difficulty in walking, not elsewhere classified  Rationale for Evaluation and Treatment Rehabilitation  ONSET DATE: 09/05/2021  SUBJECTIVE:   SUBJECTIVE STATEMENT: Pt. Reports more pain/stiffness today, thinks the weather is affecting knee.  Continuing to take the tramadol, and aleve and tylenol as well to control pain.   PERTINENT HISTORY: S/p L TKA on 09/05/2021, R TKA on 10/28/2020, gout, obesity  PAIN:  Are you having pain? Yes: NPRS scale: 5/10 Pain location: L knee  PRECAUTIONS: None  WEIGHT BEARING RESTRICTIONS No  FALLS:  Has patient fallen in last 6 months? No  LIVING ENVIRONMENT: Lives with: lives alone Lives in: House/apartment Stairs: Yes: Internal: 15 steps; on left going up Has following equipment at home: Walker - 2 wheeled and bed side commode  OCCUPATION: works from home, Personnel officer  PLOF: Independent  PATIENT GOALS full ROM in L knee like R - the same strength, flexibility, extension and flexion.   Return to golf.    OBJECTIVE:   DIAGNOSTIC FINDINGS: 09/19/2021 XR L knee: AP and lateral views of left knee reviewed.  Left total knee prosthesis in  good position alignment without any complicating features.  No evidence of loosening noted.  No abnormal patellar height.  No periprosthetic fracture noted.  PATIENT SURVEYS:  LEFS 18/80.  22.5% ability = 77.5% disability  COGNITION:  Overall cognitive status: Within functional limits  for tasks assessed     SENSATION: Mild numbness lateral side L knee  EDEMA:  Circumferential: R 51.5 cm, L 52.5 cm  POSTURE: rounded shoulders and forward head  PALPATION: Tightness along scar  LOWER EXTREMITY ROM:  (A)ctive/(P)assive ROM Right eval Left eval Left 10/12/2021 Left 10/19/2021 Left 11/03/21  Knee flexion 110A, 115 P 98A, 100P 110 P 118 119  Knee extension 0 5 4 0 0   (Blank rows = not tested)  LOWER EXTREMITY MMT:  MMT Right eval Left eval  Hip flexion 5 4*  Hip  extension    Hip abduction 4+ 4+  Hip adduction 5 5  Knee flexion 5 3+  Knee extension 5 4+   Ankle dorsiflexion 5 4*  Ankle plantarflexion     (Blank rows = not tested) *pain   FUNCTIONAL TESTS:  5 times sit to stand: 20 seconds without UE assist  GAIT: Distance walked: 50 Assistive device utilized: Walker - 2 wheeled Level of assistance: Modified independence Comments:  slow gait, reciprocal stepping, slightly antalgic, 0.48 m/s    TODAY'S TREATMENT: 11/10/2021 11/07/2021 Therapeutic Exercise: to improve strength and mobility.   Bike L2 x 6 min seat 11 Step ups (1 riser)  x 10 bil  Side step ups (1 riser) x 10 bil Step down (no riser) 2 x 10 L heel tap, for R heel tap added small airex pad to decrease distance to ~ 1 inch  Eccentric heel raises on step x 10 Standing runners stretch Bridges 2 x 10  Hip ER/IR stretch in supine 2 x 10 Neuromuscular Reeducation: to improve balance and stability. SBA  in corner for safety.  On Airex: Ankle sways x 15 wide BOS, ankle sways x 10 with narrow BOS Eyes open feet apart x 30 sec  Eyes open feet apart with head nods x 10 Eyes open feet apart with head turns x 10 Eyes closed feet apart x 30 sec Eyes closed feet apart with head nods x 10 Vasopneumatic: Gameready at end of session for post session soreness/edema.  10 min, low compression, 34 deg (coldest).  11/07/2021 Therapeutic Exercise: to improve strength and mobility.   Bike L2 x 6 min seat 12 Step ups (1 riser) 2 x 10 bil  Side step ups (1 riser) 2 x 10 bil  Eccentric heel raises on step x 10 Single leg RDLS 2 x 10 R/L - counter support.  Standing runners stretch Leg extension with 3# leg weight 2 x 10 bil  Cybex knee flexion 15# 2 x 10 LLE Cybex knee extension 10# 2 x 10 LLE Vasopneumatic: Gameready at end of session for post session soreness/edema.  10 min, low compression, 34 deg (coldest). Manual Therapy: to decrease muscle spasm and pain and improve mobility. Scar tissue  mobilization and cross friction patellar ligament while taking seated rest breaks.    11/03/21 Therapeutic Exercise: Rec Bike L2x59mn Fwd/lat step ups x 10 each LLE - 6' Mini squats 10x3" Knee flexion 15# BLE x 10; 5# x 10 LLE Knee extension 5# LLE x 10 Supine quad stretch w/ strap 2x30"  Manual Therapy: PROM to L knee flex + ext  Vasopneumatic: Gameready at end of session for post session soreness/edema.  10 min, low compression, 34 deg (coldest).  10/27/2021 Therapeutic Exercise: to improve strength and mobility.  Demo, verbal and tactile cues throughout for technique. Nustep L5 x 6 min  Gait with SPC x 300' Standing stretches at wall 2 x 30 sec bil  S/L  hip abduction 2 x 10 bil S/l clams 2 x 10 bil  Prone leg extensions 2 x 10 bil  Supine SLR 2 x 10 bil  Bridges x 10 Vasopneumatic: Gameready at end of session for post session soreness/edema.  10 min, low compression, 34 deg (coldest).   PATIENT EDUCATION:  Education details: continue HEP Person educated: Patient Education method: Customer service manager Education comprehension: verbalized understanding   HOME EXERCISE PROGRAM: Access Code: [email or web address]   ASSESSMENT:  CLINICAL IMPRESSION: [name]. Reporting more pain and swelling today, but was able to participate well throughout session, still needing intermittent short rest breaks for muscle recovery.  Introduced Therapist, sports exercises today for [employer], he was very challenged with increased sway and fatigue, unable to complete full sequence today due to LE weakness.  Game ready at end of session to decrease post session pain and edema.  [name]. continues to demonstrate potential for improvement and would benefit from continued skilled therapy to address impairments.    OBJECTIVE IMPAIRMENTS Abnormal gait, decreased balance, decreased endurance, decreased mobility, difficulty walking, decreased ROM, decreased strength, increased edema, increased  fascial restrictions, increased muscle spasms, impaired flexibility, impaired sensation, and pain.   ACTIVITY LIMITATIONS carrying, lifting, bending, sitting, standing, squatting, stairs, transfers, and locomotion level  PARTICIPATION LIMITATIONS: meal prep, cleaning, laundry, driving, shopping, and community activity  PERSONAL FACTORS 1-2 comorbidities: R TKA, obesity, gout  are also affecting patient's functional outcome.   REHAB POTENTIAL: Excellent  CLINICAL DECISION MAKING: Stable/uncomplicated  EVALUATION COMPLEXITY: Low   GOALS: Goals reviewed with patient? Yes   SHORT TERM GOALS: Target date: 10/24/2021    Independent with initial HEP. Baseline: independent with current HEP.   Goal status: MET   LONG TERM GOALS: Target date: 12/05/2021   Independent with advanced/ongoing HEP to improve outcomes and carryover.  Baseline: needs progression.  Goal status: IN PROGRESS  2.  [name]. will demonstrate left knee flexion to 115 deg to ascend/descend stairs. Baseline: 100 Goal status: MET  10/19/2021 -118  3.  [name]. will demonstrate full left knee extension for safety with gait. Baseline: lacking 5 deg due to edema Goal status: MET  4.  [name]. will be able to ambulate 600' safely with LRAD and normal gait pattern to access community.  Baseline: slow antalgic with 2WRW Goal status: IN PROGRESS  5.  [name]. will be able to ascend/descend stairs with 1 HR and reciprocal step pattern safely to access home and community.  Baseline: unable Goal status: IN PROGRESS  6.  Kevontae Burgoon. will demonstrate > 19/24 on DGI to demonstrate decreased risk of falls.   Baseline: to be assessed Goal status: IN PROGRESS  7.  [name]. will demonstrate improved functional LE strength by completing 5x STS in <15 seconds.  (MCID 5 seconds) Baseline: 20 seconds  Goal status: IN PROGRESS  8.  [name]. will demonstrate  >30/80 on LEFS to demonstrate improved mobility.  Baseline: 18/80 = 77.5% disability  Goal status: IN PROGRESS  9.  [name]. will demonstrate 5/5 LLE strength without pain for safety with gait.  Baseline: see objective Goal status: IN PROGRESS    PLAN: PT FREQUENCY: 2-3x/week  PT DURATION: 8 weeks  PLANNED INTERVENTIONS: Therapeutic exercises, Therapeutic activity, Neuromuscular re-education, Balance training, Gait training, Patient/Family education, Joint mobilization, Stair training, Aquatic Therapy, Dry Needling, Electrical stimulation, Cryotherapy, Moist heat, scar  mobilization, Taping, Vasopneumatic device, Ultrasound, Ionotophoresis 48m/ml Dexamethasone, Manual therapy, and Re-evaluation  PLAN FOR NEXT SESSION:  continue to progress exercises as tolerated, especially stairs, balance manual therapy focusing on ROM, modalities PRN   ERennie Natter PT, DPT  11/10/2021, 2:01 PM

## 2021-11-14 ENCOUNTER — Ambulatory Visit: Payer: BC Managed Care – PPO | Admitting: Physical Therapy

## 2021-11-15 ENCOUNTER — Other Ambulatory Visit: Payer: Self-pay | Admitting: Surgical

## 2021-11-15 ENCOUNTER — Telehealth: Payer: Self-pay | Admitting: Orthopedic Surgery

## 2021-11-15 ENCOUNTER — Encounter: Payer: Self-pay | Admitting: Physical Therapy

## 2021-11-15 ENCOUNTER — Ambulatory Visit: Payer: BC Managed Care – PPO | Admitting: Physical Therapy

## 2021-11-15 DIAGNOSIS — M25562 Pain in left knee: Secondary | ICD-10-CM

## 2021-11-15 DIAGNOSIS — R262 Difficulty in walking, not elsewhere classified: Secondary | ICD-10-CM

## 2021-11-15 DIAGNOSIS — M25662 Stiffness of left knee, not elsewhere classified: Secondary | ICD-10-CM

## 2021-11-15 DIAGNOSIS — M6281 Muscle weakness (generalized): Secondary | ICD-10-CM

## 2021-11-15 IMAGING — CR DG HIP (WITH OR WITHOUT PELVIS) 2-3V*L*
3 series · 3 of 3 positions shown · non-contrast
Comparison: None.

CLINICAL DATA: Pain

EXAM:
DG HIP (WITH OR WITHOUT PELVIS) 2-3V LEFT

[t pelvis a.p.]
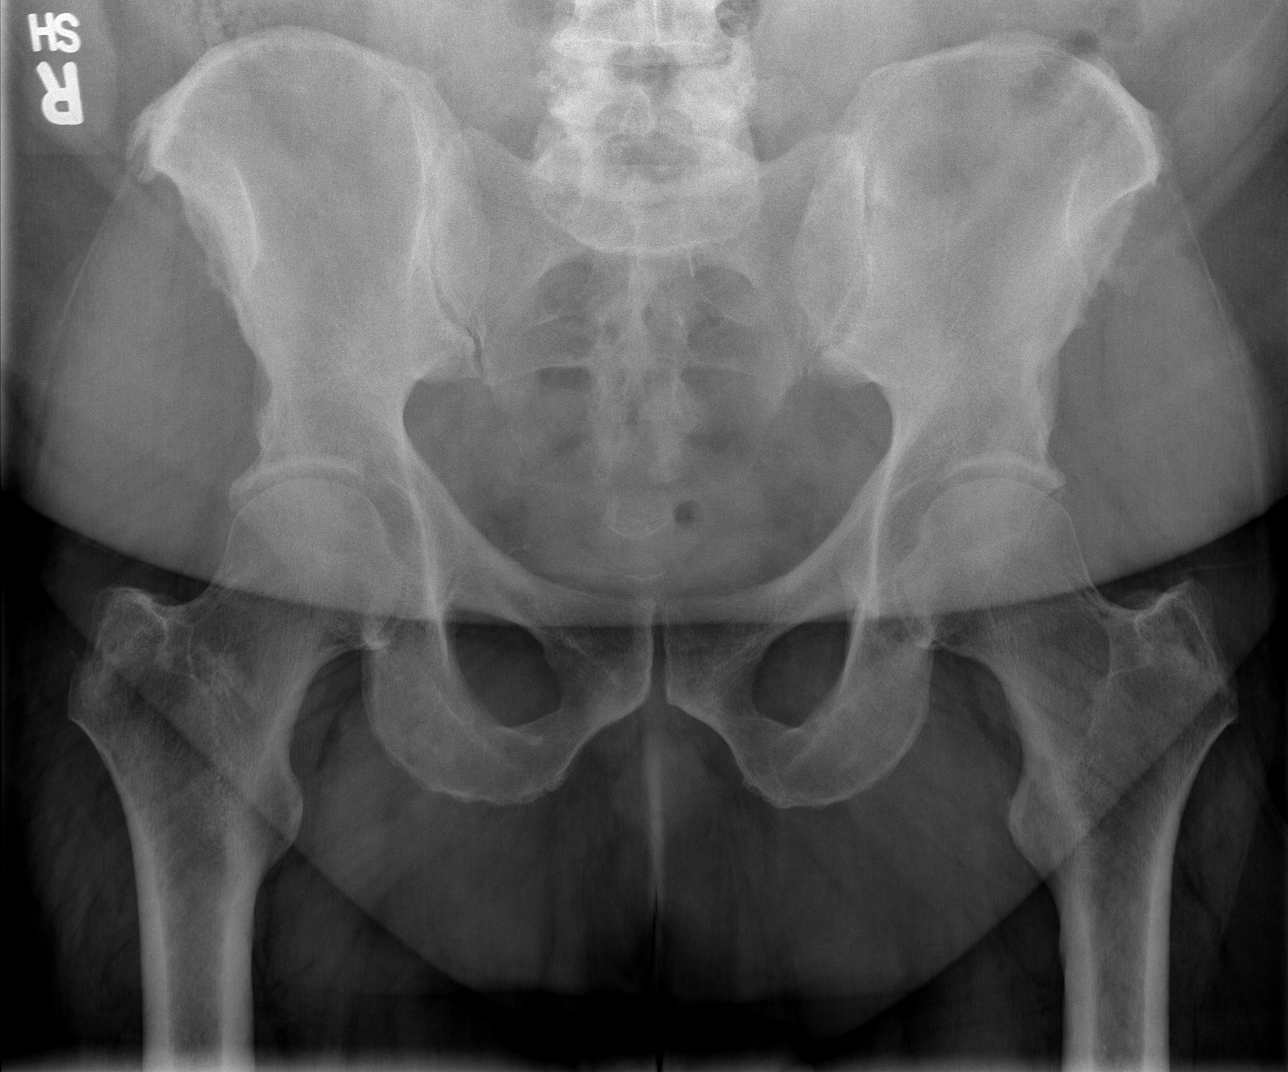

[t hip ap left]
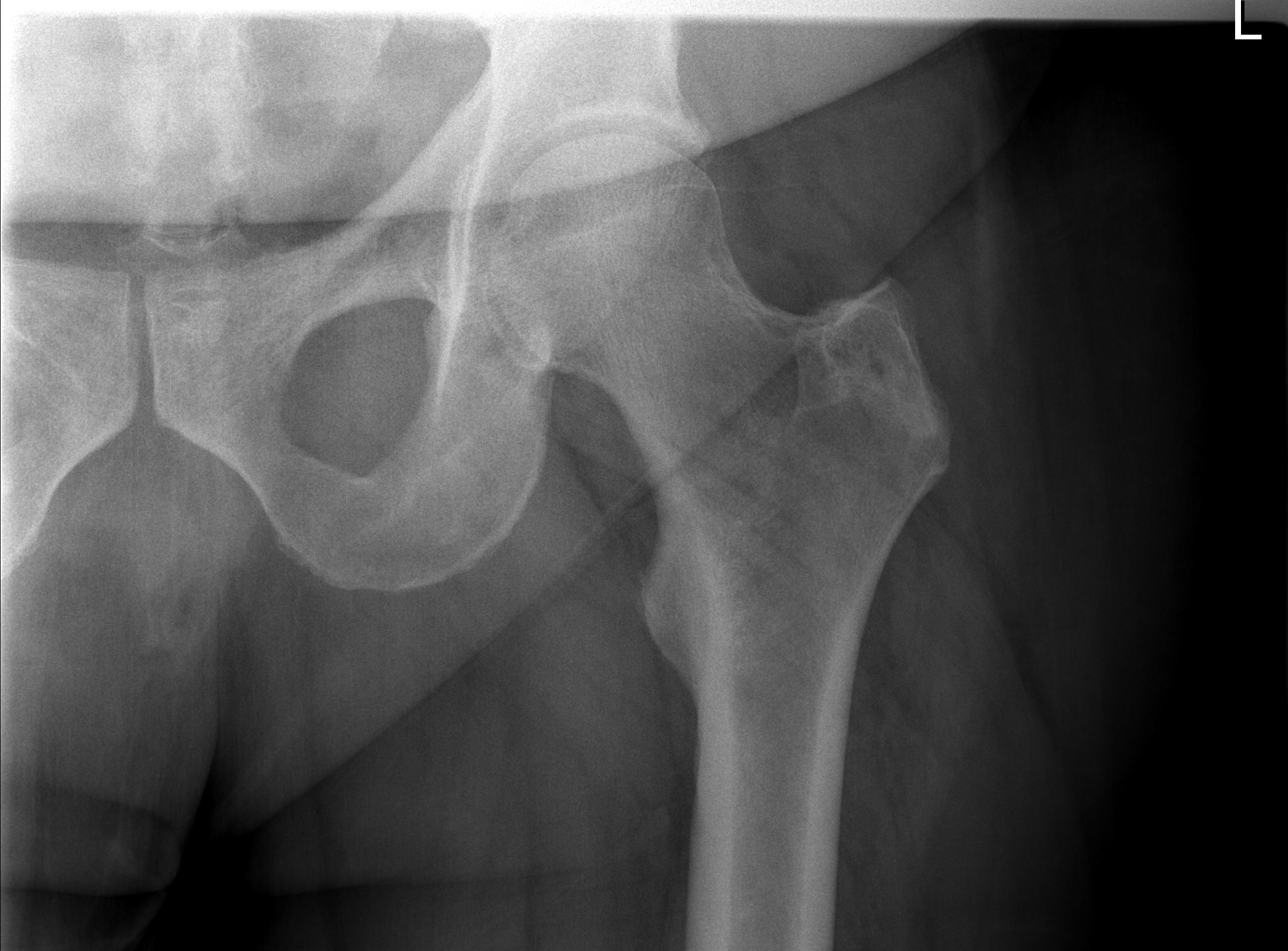

[t hip frog leg left]
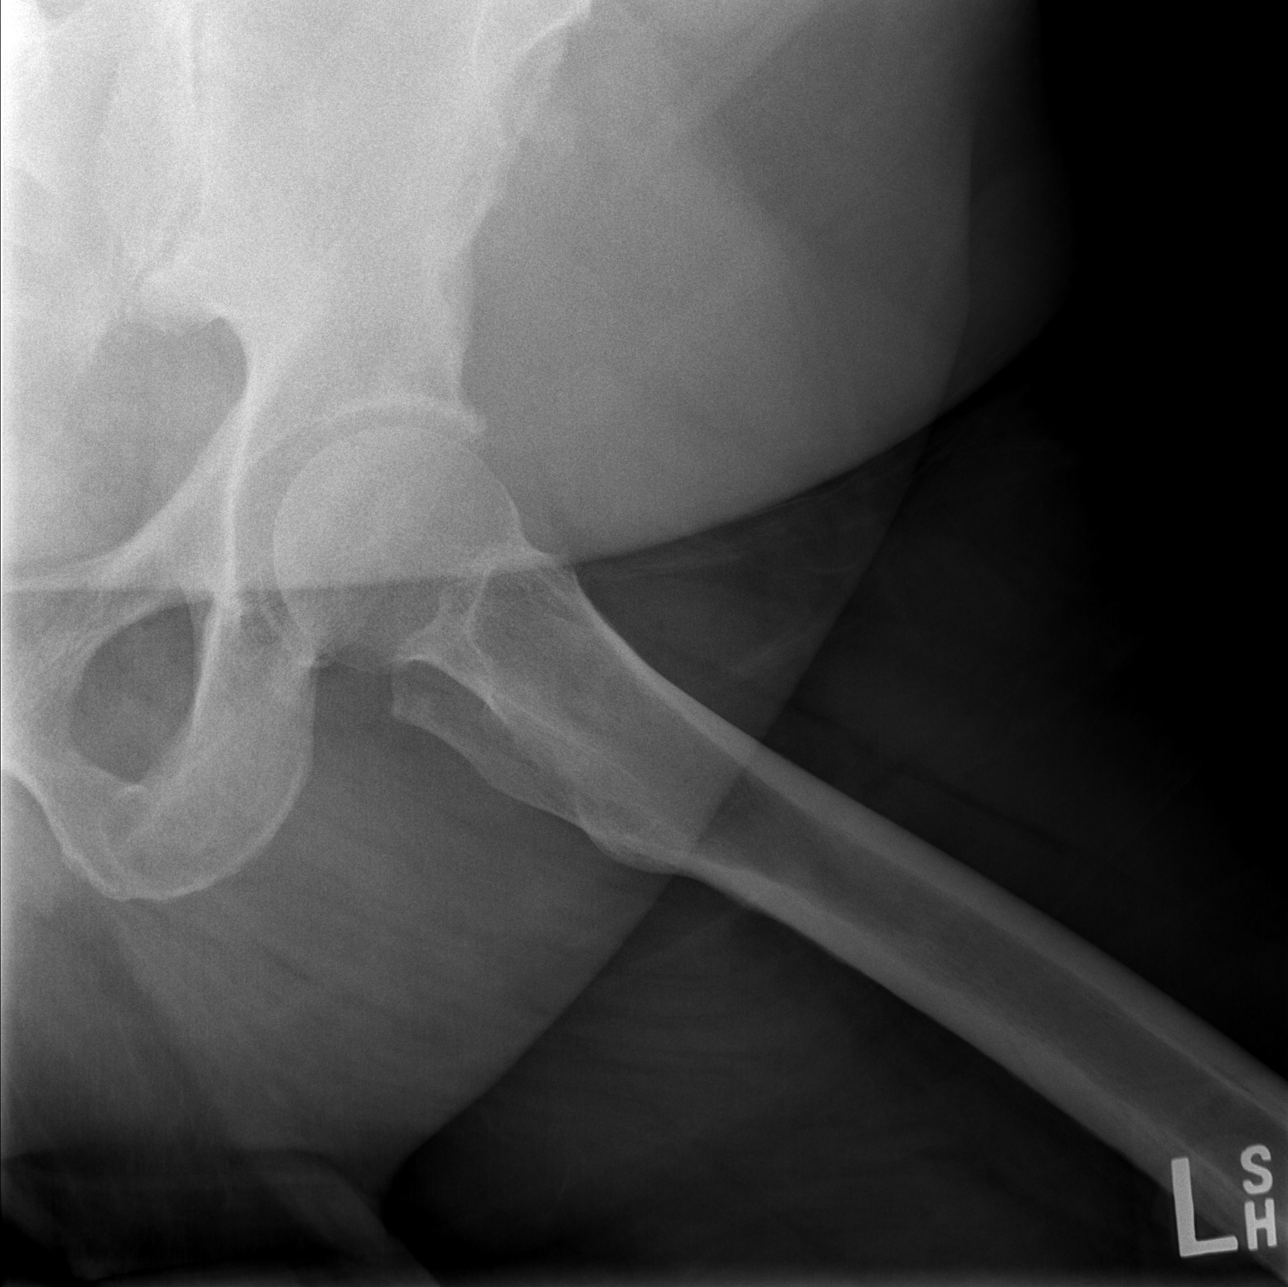

[3 of 3 positions shown; findings below may reference images not displayed]

FINDINGS: Frontal pelvis as well as frontal and lateral left hip images were
obtained. No fracture or dislocation. There is slight symmetric
narrowing of each hip joint. No erosive changes. Sacroiliac joints
appear normal bilaterally.
IMPRESSION: Mild symmetric narrowing of each hip joint. No fracture or
dislocation.

## 2021-11-15 NOTE — Therapy (Signed)
OUTPATIENT PHYSICAL THERAPY Treatment Note   Patient Name: Ernest Navarro. MRN: [id number] DOB:[birth year], 61 y.o., male Today's Date: 11/15/2021   PT End of Session - 11/15/21 1539     Visit Number 9    Number of Visits 18    Date for PT Re-Evaluation 12/05/21    PT Start Time 1536    PT Stop Time 1629    PT Time Calculation (min) 53 min    Activity Tolerance Patient tolerated treatment well    Behavior During Therapy [facility] for tasks assessed/performed              Past Medical History:  Diagnosis Date   Arthritis    bilateral knees   Essential hypertension, benign 06/19/2012   Gout 06/19/2012   Headache    Hyperglycemia 06/19/2012   March 2014 A1c 6.5 (repeat needed)    Pneumonia    Pre-diabetes    Sleep apnea    Past Surgical History:  Procedure Laterality Date   KNEE SURGERY Right    x3 arthroscopies   TOTAL KNEE ARTHROPLASTY Right 10/28/2020   Procedure: RIGHT TOTAL KNEE ARTHROPLASTY;  Surgeon: Meredith Pel, MD;  Location: [place];  Service: Orthopedics;  Laterality: Right;   TOTAL KNEE ARTHROPLASTY Left 09/05/2021   Procedure: LEFT TOTAL KNEE ARTHROPLASTY;  Surgeon: Meredith Pel, MD;  Location: [place];  Service: Orthopedics;  Laterality: Left;   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   Arthritis of right knee    S/P total knee arthroplasty, right 10/28/2020   Obesity 06/29/2015   Obstructive sleep apnea 05/06/2015   Seasonal and perennial allergic rhinitis 05/06/2015   Hyperglycemia 06/19/2012   Essential hypertension, benign 06/19/2012   Gout 06/19/2012   Nocturia 06/19/2012    PCP: General medical clinic  REFERRING PROVIDER: Meredith Pel MD  REFERRING DIAG: [phone] (ICD-10-CM) - S/P total knee arthroplasty, left  THERAPY DIAG:  Acute pain of left knee  Stiffness of left knee, not elsewhere classified  Muscle weakness  (generalized)  Difficulty in walking, not elsewhere classified  Rationale for Evaluation and Treatment Rehabilitation  ONSET DATE: 09/05/2021  SUBJECTIVE:   SUBJECTIVE STATEMENT: Pt. Feeling a little better today, has headache from medicine.  Took tylenol 20 min ago and tramadol at 2PM.  PERTINENT HISTORY: S/p L TKA on 09/05/2021, R TKA on 10/28/2020, gout, obesity  PAIN:  Are you having pain? Yes: NPRS scale: 3-4/10 Pain location: L knee  PRECAUTIONS: None  WEIGHT BEARING RESTRICTIONS No  FALLS:  Has patient fallen in last 6 months? No  LIVING ENVIRONMENT: Lives with: lives alone Lives in: House/apartment Stairs: Yes: Internal: 15 steps; on left going up Has following equipment at home: Walker - 2 wheeled and bed side commode  OCCUPATION: works from home, Personnel officer  PLOF: Independent  PATIENT GOALS full ROM in L knee like R - the same strength, flexibility, extension and flexion.   Return to golf.    OBJECTIVE:   DIAGNOSTIC FINDINGS: 09/19/2021 XR L knee: AP and lateral views of left knee reviewed.  Left total knee prosthesis in  good position alignment without any complicating features.  No evidence of loosening noted.  No abnormal patellar height.  No periprosthetic fracture noted.  PATIENT SURVEYS:  LEFS 18/80.  22.5% ability = 77.5% disability  COGNITION:  Overall cognitive status: Within functional limits for tasks assessed  SENSATION: Mild numbness lateral side L knee  EDEMA:  Circumferential: R 51.5 cm, L 52.5 cm  POSTURE: rounded shoulders and forward head  PALPATION: Tightness along scar  LOWER EXTREMITY ROM:  (A)ctive/(P)assive ROM Right eval Left eval Left 10/12/2021 Left 10/19/2021 Left 11/03/21 Left  11/15/2021  Knee flexion 110A, 115 P 98A, 100P 110 P 118 119 Pre-manual 110, post-manual 119  Knee extension 0 5 4 0 0 0   (Blank rows = not tested)  LOWER EXTREMITY MMT:  MMT Right eval Left eval  Hip flexion 5 4*  Hip  extension    Hip abduction 4+ 4+  Hip adduction 5 5  Knee flexion 5 3+  Knee extension 5 4+   Ankle dorsiflexion 5 4*  Ankle plantarflexion     (Blank rows = not tested) *pain   FUNCTIONAL TESTS:  5 times sit to stand: 20 seconds without UE assist  GAIT: Distance walked: 50 Assistive device utilized: Walker - 2 wheeled Level of assistance: Modified independence Comments:  slow gait, reciprocal stepping, slightly antalgic, 0.48 m/s    TODAY'S TREATMENT: 11/15/2021 Therapeutic Exercise: to improve strength and mobility.  Demo, verbal and tactile cues throughout for technique. Bike L2 x 6 min seat 11 Step ups (2 risers) 2 x 10 bil -2 UE support needed Side step ups (1 riser) 2 x 10 bil - 1UE support Runners stretch between sets of step ups.  Step down (no riser) 2 x 10 bil, able to tap heel to floor on L today.  Manual Therapy: to decrease muscle spasm and pain and improve mobility STM/TPR L quads, patellar mobs, scar tissue mobilization, skilled palpation and monitoring during dry needling. Trigger Point Dry-Needling  Treatment instructions: Expect mild to moderate muscle soreness.  Patient verbalized understanding of these instructions and education. Patient Consent Given: Yes Education handout provided: Yes Muscles treated: R rectus femoris Treatment response/outcome: Twitch Response Elicited and Palpable Increase in Muscle Length Vasopneumatic: Gameready at end of session for post session soreness/edema.  10 min, low compression, 34 deg (coldest).   11/10/2021 Therapeutic Exercise: to improve strength and mobility.   Bike L2 x 6 min seat 11 Step ups (1 riser)  x 10 bil  Side step ups (1 riser) x 10 bil Step down (no riser) 2 x 10 L heel tap, for R heel tap added small airex pad to decrease distance to ~ 1 inch  Eccentric heel raises on step x 10 Standing runners stretch Bridges 2 x 10  Hip ER/IR stretch in supine 2 x 10 Neuromuscular Reeducation: to improve balance and  stability. SBA  in corner for safety.  On Airex: Ankle sways x 15 wide BOS, ankle sways x 10 with narrow BOS Eyes open feet apart x 30 sec  Eyes open feet apart with head nods x 10 Eyes open feet apart with head turns x 10 Eyes closed feet apart x 30 sec Eyes closed feet apart with head nods x 10 Vasopneumatic: Gameready at end of session for post session soreness/edema.  10 min, low compression, 34 deg (coldest).  11/07/2021 Therapeutic Exercise: to improve strength and mobility.   Bike L2 x 6 min seat 12 Step ups (1 riser) 2 x 10 bil  Side step ups (1 riser) 2 x 10 bil  Eccentric heel raises on step x 10 Single leg RDLS 2 x 10 R/L - counter support.  Standing runners stretch Leg extension with 3# leg weight 2 x 10 bil  Cybex knee flexion 15# 2  x 10 LLE Cybex knee extension 10# 2 x 10 LLE Vasopneumatic: Gameready at end of session for post session soreness/edema.  10 min, low compression, 34 deg (coldest). Manual Therapy: to decrease muscle spasm and pain and improve mobility. Scar tissue mobilization and cross friction patellar ligament while taking seated rest breaks.    PATIENT EDUCATION:  Education details: Dry needling Person educated: Patient Education method: Theatre stage manager Education comprehension: verbalized understanding   HOME EXERCISE PROGRAM: Access Code: [email or web address]   ASSESSMENT:  CLINICAL IMPRESSION: [name]. Still reporting swelling and tightness in R quad.  Continued to focus TE on functional activities like stairs.  Still has weakness with R eccentric control in quads but improving.  Also noted today when measured decreased R knee flexion to 110 deg, but after manual therapy including dry needling improved to 119 deg.  Encouraged to continue knee flexion stretches at home frequently throughout the day to prevent loss of ROM.   Game ready at end of session to decrease post session pain and edema.  [name]. continues to demonstrate  potential for improvement and would benefit from continued skilled therapy to address impairments.    OBJECTIVE IMPAIRMENTS Abnormal gait, decreased balance, decreased endurance, decreased mobility, difficulty walking, decreased ROM, decreased strength, increased edema, increased fascial restrictions, increased muscle spasms, impaired flexibility, impaired sensation, and pain.   ACTIVITY LIMITATIONS carrying, lifting, bending, sitting, standing, squatting, stairs, transfers, and locomotion level  PARTICIPATION LIMITATIONS: meal prep, cleaning, laundry, driving, shopping, and community activity  PERSONAL FACTORS 1-2 comorbidities: R TKA, obesity, gout  are also affecting patient's functional outcome.   REHAB POTENTIAL: Excellent  CLINICAL DECISION MAKING: Stable/uncomplicated  EVALUATION COMPLEXITY: Low   GOALS: Goals reviewed with patient? Yes   SHORT TERM GOALS: Target date: 10/24/2021    Independent with initial HEP. Baseline: independent with current HEP.   Goal status: MET   LONG TERM GOALS: Target date: 12/05/2021   Independent with advanced/ongoing HEP to improve outcomes and carryover.  Baseline: needs progression.  Goal status: IN PROGRESS  2.  [name]. will demonstrate left knee flexion to 115 deg to ascend/descend stairs. Baseline: 100 Goal status: MET  10/19/2021 -118  3.  [name]. will demonstrate full left knee extension for safety with gait. Baseline: lacking 5 deg due to edema Goal status: MET  4.  [name]. will be able to ambulate 600' safely with LRAD and normal gait pattern to access community.  Baseline: slow antalgic with 2WRW Goal status: IN PROGRESS  5.  [name]. will be able to ascend/descend stairs with 1 HR and reciprocal step pattern safely to access home and community.  Baseline: unable Goal status: IN PROGRESS  6.  Syrus Nakama. will demonstrate > 19/24 on DGI to demonstrate decreased risk of  falls.   Baseline: to be assessed Goal status: IN PROGRESS  7.  [name]. will demonstrate improved functional LE strength by completing 5x STS in <15 seconds.  (MCID 5 seconds) Baseline: 20 seconds  Goal status: IN PROGRESS  8.  [name]. will demonstrate >30/80 on LEFS to demonstrate improved mobility.  Baseline: 18/80 = 77.5% disability  Goal status: IN PROGRESS  9.  [name]. will demonstrate 5/5 LLE strength without pain for safety with gait.  Baseline: see objective Goal status: IN PROGRESS    PLAN: PT FREQUENCY: 2-3x/week  PT DURATION: 8 weeks  PLANNED INTERVENTIONS: Therapeutic exercises, Therapeutic activity, Neuromuscular re-education, Balance training, Gait training, Patient/Family education, Joint mobilization, Stair training, Aquatic Therapy, Dry Needling, Electrical stimulation, Cryotherapy, Moist heat, scar mobilization, Taping, Vasopneumatic device, Ultrasound, Ionotophoresis 79m/ml Dexamethasone, Manual therapy, and Re-evaluation  PLAN FOR NEXT SESSION:  continue to progress exercises as tolerated, especially stairs, balance manual therapy focusing on ROM, modalities PRN   ERennie Natter PT, DPT  11/15/2021, 4:35 PM

## 2021-11-15 NOTE — Telephone Encounter (Signed)
Refill on tramadol

## 2021-11-16 ENCOUNTER — Other Ambulatory Visit: Payer: Self-pay | Admitting: Surgical

## 2021-11-16 NOTE — Telephone Encounter (Signed)
Sent in refill, decreased frequency on RX

## 2021-11-17 ENCOUNTER — Ambulatory Visit: Payer: BC Managed Care – PPO

## 2021-11-18 ENCOUNTER — Ambulatory Visit: Payer: BC Managed Care – PPO

## 2021-11-18 DIAGNOSIS — R262 Difficulty in walking, not elsewhere classified: Secondary | ICD-10-CM

## 2021-11-18 DIAGNOSIS — M25562 Pain in left knee: Secondary | ICD-10-CM | POA: Diagnosis not present

## 2021-11-18 DIAGNOSIS — M6281 Muscle weakness (generalized): Secondary | ICD-10-CM

## 2021-11-18 DIAGNOSIS — M25662 Stiffness of left knee, not elsewhere classified: Secondary | ICD-10-CM

## 2021-11-18 NOTE — Therapy (Signed)
OUTPATIENT PHYSICAL THERAPY Treatment Note   Patient Name: Ernest Navarro. MRN: [id number] DOB:Sep 25, 1960, 61 y.o., male Today's Date: 11/18/2021   PT End of Session - 11/18/21 1151     Visit Number 10    Number of Visits 18    Date for PT Re-Evaluation 12/05/21    PT Start Time 1102    PT Stop Time 1158    PT Time Calculation (min) 56 min    Activity Tolerance Patient tolerated treatment well    Behavior During Therapy [facility] for tasks assessed/performed               Past Medical History:  Diagnosis Date   Arthritis    bilateral knees   Essential hypertension, benign 06/19/2012   Gout 06/19/2012   Headache    Hyperglycemia 06/19/2012   March 2014 A1c 6.5 (repeat needed)    Pneumonia    Pre-diabetes    Sleep apnea    Past Surgical History:  Procedure Laterality Date   KNEE SURGERY Right    x3 arthroscopies   TOTAL KNEE ARTHROPLASTY Right 10/28/2020   Procedure: RIGHT TOTAL KNEE ARTHROPLASTY;  Surgeon: Meredith Pel, MD;  Location: [place];  Service: Orthopedics;  Laterality: Right;   TOTAL KNEE ARTHROPLASTY Left 09/05/2021   Procedure: LEFT TOTAL KNEE ARTHROPLASTY;  Surgeon: Meredith Pel, MD;  Location: [place];  Service: Orthopedics;  Laterality: Left;   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   Arthritis of right knee    S/P total knee arthroplasty, right 10/28/2020   Obesity 06/29/2015   Obstructive sleep apnea 05/06/2015   Seasonal and perennial allergic rhinitis 05/06/2015   Hyperglycemia 06/19/2012   Essential hypertension, benign 06/19/2012   Gout 06/19/2012   Nocturia 06/19/2012    PCP: General medical clinic  REFERRING PROVIDER: Meredith Pel MD  REFERRING DIAG: [phone] (ICD-10-CM) - S/P total knee arthroplasty, left  THERAPY DIAG:  Acute pain of left knee  Stiffness of left knee, not elsewhere classified  Muscle weakness  (generalized)  Difficulty in walking, not elsewhere classified  Rationale for Evaluation and Treatment Rehabilitation  ONSET DATE: 09/05/2021  SUBJECTIVE:   SUBJECTIVE STATEMENT: Pt reports cont'd swelling in L knee, doing exercises at least once per day.  PERTINENT HISTORY: S/p L TKA on 09/05/2021, R TKA on 10/28/2020, gout, obesity  PAIN:  Are you having pain? Yes: NPRS scale: 3/10 Pain location: L knee  PRECAUTIONS: None  WEIGHT BEARING RESTRICTIONS No  FALLS:  Has patient fallen in last 6 months? No  LIVING ENVIRONMENT: Lives with: lives alone Lives in: House/apartment Stairs: Yes: Internal: 15 steps; on left going up Has following equipment at home: Walker - 2 wheeled and bed side commode  OCCUPATION: works from home, Personnel officer  PLOF: Independent  PATIENT GOALS full ROM in L knee like R - the same strength, flexibility, extension and flexion.   Return to golf.    OBJECTIVE:   DIAGNOSTIC FINDINGS: 09/19/2021 XR L knee: AP and lateral views of left knee reviewed.  Left total knee prosthesis in  good position alignment without any complicating features.  No evidence of loosening noted.  No abnormal patellar height.  No periprosthetic fracture noted.  PATIENT SURVEYS:  LEFS 18/80.  22.5% ability = 77.5% disability  COGNITION:  Overall cognitive status: Within functional limits for tasks assessed     SENSATION: Mild numbness lateral side  L knee  EDEMA:  Circumferential: R 51.5 cm, L 52.5 cm  POSTURE: rounded shoulders and forward head  PALPATION: Tightness along scar  LOWER EXTREMITY ROM:  (A)ctive/(P)assive ROM Right eval Left eval Left 10/12/2021 Left 10/19/2021 Left 11/03/21 Left  11/15/2021  Knee flexion 110A, 115 P 98A, 100P 110 P 118 119 Pre-manual 110, post-manual 119  Knee extension 0 5 4 0 0 0   (Blank rows = not tested)  LOWER EXTREMITY MMT:  MMT Right eval Left eval  Hip flexion 5 4*  Hip extension    Hip abduction 4+ 4+   Hip adduction 5 5  Knee flexion 5 3+  Knee extension 5 4+   Ankle dorsiflexion 5 4*  Ankle plantarflexion     (Blank rows = not tested) *pain   FUNCTIONAL TESTS:  5 times sit to stand: 20 seconds without UE assist  GAIT: Distance walked: 50 Assistive device utilized: Environmental consultant - 2 wheeled Level of assistance: Modified independence Comments:  slow gait, reciprocal stepping, slightly antalgic, 0.48 m/s    TODAY'S TREATMENT: 11/18/21 Therapeutic Exercise: Bike L3x27mn Leg extension L LE 2 x 10 10#, BLE 20# 2x10 Eccentric step down 6' step 2x10 Lat step down w/ R leg tap to ground 2x10 Leg curls BLE 25# 2x10, LLE 10# 2x10 Standing L SLS with opp leg reach to colored dots 1/2 circle pattern 3x  Manual Therapy: STM to R proximal quads, focused on TP along RF Vasopneumatic: Gameready at end of session for post session soreness/edema.  10 min, low compression, 34 deg (coldest).   11/15/2021 Therapeutic Exercise: to improve strength and mobility.  Demo, verbal and tactile cues throughout for technique. Bike L2 x 6 min seat 11 Step ups (2 risers) 2 x 10 bil -2 UE support needed Side step ups (1 riser) 2 x 10 bil - 1UE support Runners stretch between sets of step ups.  Step down (no riser) 2 x 10 bil, able to tap heel to floor on L today.  Manual Therapy: to decrease muscle spasm and pain and improve mobility STM/TPR L quads, patellar mobs, scar tissue mobilization, skilled palpation and monitoring during dry needling. Trigger Point Dry-Needling  Treatment instructions: Expect mild to moderate muscle soreness.  Patient verbalized understanding of these instructions and education. Patient Consent Given: Yes Education handout provided: Yes Muscles treated: R rectus femoris Treatment response/outcome: Twitch Response Elicited and Palpable Increase in Muscle Length Vasopneumatic: Gameready at end of session for post session soreness/edema.  10 min, low compression, 34 deg  (coldest).   11/10/2021 Therapeutic Exercise: to improve strength and mobility.   Bike L2 x 6 min seat 11 Step ups (1 riser)  x 10 bil  Side step ups (1 riser) x 10 bil Step down (no riser) 2 x 10 L heel tap, for R heel tap added small airex pad to decrease distance to ~ 1 inch  Eccentric heel raises on step x 10 Standing runners stretch Bridges 2 x 10  Hip ER/IR stretch in supine 2 x 10 Neuromuscular Reeducation: to improve balance and stability. SBA  in corner for safety.  On Airex: Ankle sways x 15 wide BOS, ankle sways x 10 with narrow BOS Eyes open feet apart x 30 sec  Eyes open feet apart with head nods x 10 Eyes open feet apart with head turns x 10 Eyes closed feet apart x 30 sec Eyes closed feet apart with head nods x 10 Vasopneumatic: Gameready at end of session for post session soreness/edema.  10 min, low compression, 34 deg (coldest).  11/07/2021 Therapeutic Exercise: to improve strength and mobility.   Bike L2 x 6 min seat 12 Step ups (1 riser) 2 x 10 bil  Side step ups (1 riser) 2 x 10 bil  Eccentric heel raises on step x 10 Single leg RDLS 2 x 10 R/L - counter support.  Standing runners stretch Leg extension with 3# leg weight 2 x 10 bil  Cybex knee flexion 15# 2 x 10 LLE Cybex knee extension 10# 2 x 10 LLE Vasopneumatic: Gameready at end of session for post session soreness/edema.  10 min, low compression, 34 deg (coldest). Manual Therapy: to decrease muscle spasm and pain and improve mobility. Scar tissue mobilization and cross friction patellar ligament while taking seated rest breaks.    PATIENT EDUCATION:  Education details: Dry needling Person educated: Patient Education method: Theatre stage manager Education comprehension: verbalized understanding   HOME EXERCISE PROGRAM: Access Code: [email or web address]   ASSESSMENT:  CLINICAL IMPRESSION: Pt continues to note swelling in the L knee, he was assured that this was normal and it normally takes time for  swelling to conclude. He still has difficulty with eccentric lowering descending stairs when leading w/ R leg. Knee ROM is well WFL. He showed good response to the progression of TE. Pt is progressing toward goals. Continued to emphasize quad focused exercises to improve stability with stairs and improve confidence w/ ADLs. Pt would continue to benefit from PT in order to improve function.   OBJECTIVE IMPAIRMENTS Abnormal gait, decreased balance, decreased endurance, decreased mobility, difficulty walking, decreased ROM, decreased strength, increased edema, increased fascial restrictions, increased muscle spasms, impaired flexibility, impaired sensation, and pain.   ACTIVITY LIMITATIONS carrying, lifting, bending, sitting, standing, squatting, stairs, transfers, and locomotion level  PARTICIPATION LIMITATIONS: meal prep, cleaning, laundry, driving, shopping, and community activity  PERSONAL FACTORS 1-2 comorbidities: R TKA, obesity, gout  are also affecting patient's functional outcome.   REHAB POTENTIAL: Excellent  CLINICAL DECISION MAKING: Stable/uncomplicated  EVALUATION COMPLEXITY: Low   GOALS: Goals reviewed with patient? Yes   SHORT TERM GOALS: Target date: 10/24/2021    Independent with initial HEP. Baseline: independent with current HEP.   Goal status: MET   LONG TERM GOALS: Target date: 12/05/2021   Independent with advanced/ongoing HEP to improve outcomes and carryover.  Baseline: needs progression.  Goal status: IN PROGRESS  2.  [name]. will demonstrate left knee flexion to 115 deg to ascend/descend stairs. Baseline: 100 Goal status: MET  10/19/2021 -118  3.  [name]. will demonstrate full left knee extension for safety with gait. Baseline: lacking 5 deg due to edema Goal status: MET  4.  [name]. will be able to ambulate 600' safely with LRAD and normal gait pattern to access community.  Baseline: slow antalgic with 2WRW Goal  status: IN PROGRESS  5.  [name]. will be able to ascend/descend stairs with 1 HR and reciprocal step pattern safely to access home and community.  Baseline: unable Goal status: IN PROGRESS - pt reports good going up stairs but difficult going down - 11/18/21  6.  [name]. will demonstrate > 19/24 on DGI to demonstrate decreased risk of falls.   Baseline: to be assessed Goal status: IN PROGRESS  7.  [name]. will demonstrate improved functional LE strength by completing 5x STS in <15 seconds.  (MCID 5 seconds) Baseline: 20 seconds  Goal status: IN  PROGRESS  8.  [name]. will demonstrate >30/80 on LEFS to demonstrate improved mobility.  Baseline: 18/80 = 77.5% disability  Goal status: IN PROGRESS  9.  [name]. will demonstrate 5/5 LLE strength without pain for safety with gait.  Baseline: see objective Goal status: IN PROGRESS    PLAN: PT FREQUENCY: 2-3x/week  PT DURATION: 8 weeks  PLANNED INTERVENTIONS: Therapeutic exercises, Therapeutic activity, Neuromuscular re-education, Balance training, Gait training, Patient/Family education, Joint mobilization, Stair training, Aquatic Therapy, Dry Needling, Electrical stimulation, Cryotherapy, Moist heat, scar mobilization, Taping, Vasopneumatic device, Ultrasound, Ionotophoresis 6m/ml Dexamethasone, Manual therapy, and Re-evaluation  PLAN FOR NEXT SESSION:  continue to progress exercises as tolerated, especially stairs, balance manual therapy focusing on ROM, modalities PRN   BArtist Pais PTA 11/18/2021, 12:07 PM

## 2021-11-21 ENCOUNTER — Encounter: Payer: Self-pay | Admitting: Physical Therapy

## 2021-11-21 ENCOUNTER — Ambulatory Visit: Payer: BC Managed Care – PPO | Admitting: Physical Therapy

## 2021-11-21 DIAGNOSIS — M25662 Stiffness of left knee, not elsewhere classified: Secondary | ICD-10-CM

## 2021-11-21 DIAGNOSIS — R262 Difficulty in walking, not elsewhere classified: Secondary | ICD-10-CM

## 2021-11-21 DIAGNOSIS — M6281 Muscle weakness (generalized): Secondary | ICD-10-CM

## 2021-11-21 DIAGNOSIS — M25562 Pain in left knee: Secondary | ICD-10-CM

## 2021-11-21 NOTE — Therapy (Signed)
OUTPATIENT PHYSICAL THERAPY Treatment Note Progress Note Reporting Period 10/10/2021 to 11/21/2021  See note below for Objective Data and Assessment of Progress/Goals.      Patient Name: Ernest Navarro. MRN: [id number] DOB:[birth year], 61 y.o., male Today's Date: 11/21/2021   PT End of Session - 11/21/21 1316     Visit Number 11    Number of Visits 18    Date for PT Re-Evaluation 12/05/21    PT Start Time 1315    PT Stop Time 1409    PT Time Calculation (min) 54 min    Activity Tolerance Patient tolerated treatment well    Behavior During Therapy [facility] for tasks assessed/performed               Past Medical History:  Diagnosis Date   Arthritis    bilateral knees   Essential hypertension, benign 06/19/2012   Gout 06/19/2012   Headache    Hyperglycemia 06/19/2012   March 2014 A1c 6.5 (repeat needed)    Pneumonia    Pre-diabetes    Sleep apnea    Past Surgical History:  Procedure Laterality Date   KNEE SURGERY Right    x3 arthroscopies   TOTAL KNEE ARTHROPLASTY Right 10/28/2020   Procedure: RIGHT TOTAL KNEE ARTHROPLASTY;  Surgeon: Meredith Pel, MD;  Location: [place];  Service: Orthopedics;  Laterality: Right;   TOTAL KNEE ARTHROPLASTY Left 09/05/2021   Procedure: LEFT TOTAL KNEE ARTHROPLASTY;  Surgeon: Meredith Pel, MD;  Location: [place];  Service: Orthopedics;  Laterality: Left;   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   Arthritis of right knee    S/P total knee arthroplasty, right 10/28/2020   Obesity 06/29/2015   Obstructive sleep apnea 05/06/2015   Seasonal and perennial allergic rhinitis 05/06/2015   Hyperglycemia 06/19/2012   Essential hypertension, benign 06/19/2012   Gout 06/19/2012   Nocturia 06/19/2012    PCP: General medical clinic  REFERRING PROVIDER: Meredith Pel MD  REFERRING DIAG: [phone] (ICD-10-CM) - S/P total knee arthroplasty,  left  THERAPY DIAG:  Acute pain of left knee  Stiffness of left knee, not elsewhere classified  Muscle weakness (generalized)  Difficulty in walking, not elsewhere classified  Rationale for Evaluation and Treatment Rehabilitation  ONSET DATE: 09/05/2021  SUBJECTIVE:   SUBJECTIVE STATEMENT: Pt reports continued swelling in L knee, doing exercises at least once per day.  Taking celebrex and tramadol.  Taking tramadol 2x/day, tried 1 pill at night and that was ok, with tylenol.   Concerned about continued swelling.   PERTINENT HISTORY: S/p L TKA on 09/05/2021, R TKA on 10/28/2020, gout, obesity  PAIN:  Are you having pain? Yes: NPRS scale: 3/10 Pain location: L knee  PRECAUTIONS: None  WEIGHT BEARING RESTRICTIONS No  FALLS:  Has patient fallen in last 6 months? No  LIVING ENVIRONMENT: Lives with: lives alone Lives in: House/apartment Stairs: Yes: Internal: 15 steps; on left going up Has following equipment at home: Walker - 2 wheeled and bed side commode  OCCUPATION: works from home, Personnel officer  PLOF: Independent  PATIENT GOALS full ROM in L knee like R - the same strength, flexibility, extension and flexion.   Return to golf.    OBJECTIVE:   DIAGNOSTIC FINDINGS: 09/19/2021 XR L knee: AP and lateral views of left knee reviewed.  Left total knee prosthesis in  good position alignment without any complicating features.  No evidence of loosening noted.  No abnormal patellar height.  No periprosthetic fracture noted.  PATIENT SURVEYS:  LEFS 18/80.  22.5% ability = 77.5% disability  COGNITION:  Overall cognitive status: Within functional limits for tasks assessed     SENSATION: Mild numbness lateral side L knee  EDEMA:  Circumferential: R 51.5 cm, L 52.5 cm  POSTURE: rounded shoulders and forward head  PALPATION: Tightness along scar  LOWER EXTREMITY ROM:  (A)ctive/(P)assive ROM Right eval Left eval Left 10/12/2021 Left 10/19/2021 Left 11/03/21 Left   11/15/2021  Knee flexion 110A, 115 P 98A, 100P 110 P 118 119 Pre-manual 110, post-manual 119  Knee extension 0 5 4 0 0 0   (Blank rows = not tested)  LOWER EXTREMITY MMT:  MMT Right eval Left eval Left 11/21/21  Hip flexion 5 4* 5  Hip extension     Hip abduction 4+ 4+ 5  Hip adduction _0 Knee flexion 5 3+ 4+  Knee extension 5 4+  5  Ankle dorsiflexion 5 4* 5  Ankle plantarflexion      (Blank rows = not tested) *pain   FUNCTIONAL TESTS:  5 times sit to stand: 20 seconds without UE assist  GAIT: Distance walked: 50 Assistive device utilized: Walker - 2 wheeled Level of assistance: Modified independence Comments:  slow gait, reciprocal stepping, slightly antalgic, 0.48 m/s    TODAY'S TREATMENT: 11/21/21 Therapeutic Exercise: to improve strength and mobility.   Bike L3 x 6 min  Step ups 2 x 10 bil (2 risers)  Cybex leg extensions - starting concentric phase 20# with bil LE, then dropping RLE to focus on isometric then eccentric lowering.   Cybex knee flexions - starting concentric phase 20# with bil LE, then dropping RLE to focus on eccentric phase.  Leg press 25# 2 x 10 bil LE Calf raise on leg press 25# bil LE Manual Therapy: to decrease muscle spasm and pain and improve mobility.  Scar tissue mobilization, patellar mobs, IASTM with s/s tools to distal quad.   Vasopneumatic: Gameready at end of session for post session soreness/edema.  10 min, low compression, 34 deg (coldest).  11/18/21 Therapeutic Exercise: Bike L3x2mn Leg extension L LE 2 x 10 10#, BLE 20# 2x10 Eccentric step down 6' step 2x10 Lat step down w/ R leg tap to ground 2x10 Leg curls BLE 25# 2x10, LLE 10# 2x10 Standing L SLS with opp leg reach to colored dots 1/2 circle pattern 3x  Manual Therapy: STM to R proximal quads, focused on TP along RF Vasopneumatic: Gameready at end of session for post session soreness/edema.  10 min, low compression, 34 deg (coldest).   11/15/2021 Therapeutic  Exercise: to improve strength and mobility.  Demo, verbal and tactile cues throughout for technique. Bike L2 x 6 min seat 11 Step ups (2 risers) 2 x 10 bil -2 UE support needed Side step ups (1 riser) 2 x 10 bil - 1UE support Runners stretch between sets of step ups.  Step down (no riser) 2 x 10 bil, able to tap heel to floor on L today.  Manual Therapy: to decrease muscle spasm and pain and improve mobility STM/TPR L quads, patellar mobs, scar tissue mobilization, skilled palpation and monitoring during dry needling. Trigger Point Dry-Needling  Treatment instructions: Expect mild to moderate muscle soreness.  Patient verbalized understanding of these instructions and education. Patient Consent Given: Yes Education handout provided: Yes Muscles treated: R rectus femoris Treatment response/outcome: Twitch Response Elicited and Palpable Increase  in Muscle Length Vasopneumatic: Gameready at end of session for post session soreness/edema.  10 min, low compression, 34 deg (coldest).   11/10/2021 Therapeutic Exercise: to improve strength and mobility.   Bike L2 x 6 min seat 11 Step ups (1 riser)  x 10 bil  Side step ups (1 riser) x 10 bil Step down (no riser) 2 x 10 L heel tap, for R heel tap added small airex pad to decrease distance to ~ 1 inch  Eccentric heel raises on step x 10 Standing runners stretch Bridges 2 x 10  Hip ER/IR stretch in supine 2 x 10 Neuromuscular Reeducation: to improve balance and stability. SBA  in corner for safety.  On Airex: Ankle sways x 15 wide BOS, ankle sways x 10 with narrow BOS Eyes open feet apart x 30 sec  Eyes open feet apart with head nods x 10 Eyes open feet apart with head turns x 10 Eyes closed feet apart x 30 sec Eyes closed feet apart with head nods x 10 Vasopneumatic: Gameready at end of session for post session soreness/edema.  10 min, low compression, 34 deg (coldest).  11/07/2021 Therapeutic Exercise: to improve strength and mobility.   Bike  L2 x 6 min seat 12 Step ups (1 riser) 2 x 10 bil  Side step ups (1 riser) 2 x 10 bil  Eccentric heel raises on step x 10 Single leg RDLS 2 x 10 R/L - counter support.  Standing runners stretch Leg extension with 3# leg weight 2 x 10 bil  Cybex knee flexion 15# 2 x 10 LLE Cybex knee extension 10# 2 x 10 LLE Vasopneumatic: Gameready at end of session for post session soreness/edema.  10 min, low compression, 34 deg (coldest). Manual Therapy: to decrease muscle spasm and pain and improve mobility. Scar tissue mobilization and cross friction patellar ligament while taking seated rest breaks.    PATIENT EDUCATION:  Education details: Dry needling Person educated: Patient Education method: Theatre stage manager Education comprehension: verbalized understanding   HOME EXERCISE PROGRAM: Access Code: [email or web address]   ASSESSMENT:  CLINICAL IMPRESSION: Pt continues to note swelling in the L knee, he was assured that this was normal and it normally takes time for swelling to conclude. He still has difficulty with eccentric lowering descending stairs when leading with R leg.  Today continued to focus on eccentric strengthening of Left quads and hamstrings.   Game ready at end of session for post-session soreness and edema.  Pt would continue to benefit from PT in order to improve function.   OBJECTIVE IMPAIRMENTS Abnormal gait, decreased balance, decreased endurance, decreased mobility, difficulty walking, decreased ROM, decreased strength, increased edema, increased fascial restrictions, increased muscle spasms, impaired flexibility, impaired sensation, and pain.   ACTIVITY LIMITATIONS carrying, lifting, bending, sitting, standing, squatting, stairs, transfers, and locomotion level  PARTICIPATION LIMITATIONS: meal prep, cleaning, laundry, driving, shopping, and community activity  PERSONAL FACTORS 1-2 comorbidities: R TKA, obesity, gout  are also affecting patient's functional outcome.   REHAB  POTENTIAL: Excellent  CLINICAL DECISION MAKING: Stable/uncomplicated  EVALUATION COMPLEXITY: Low   GOALS: Goals reviewed with patient? Yes   SHORT TERM GOALS: Target date: 10/24/2021    Independent with initial HEP. Baseline: independent with current HEP.   Goal status: MET   LONG TERM GOALS: Target date: 12/05/2021   Independent with advanced/ongoing HEP to improve outcomes and carryover.  Baseline: needs progression.  Goal status: IN PROGRESS  2.  [name]. will demonstrate left knee flexion  to 115 deg to ascend/descend stairs. Baseline: 100 Goal status: MET  10/19/2021 -118  3.  [name]. will demonstrate full left knee extension for safety with gait. Baseline: lacking 5 deg due to edema Goal status: MET  4.  [name]. will be able to ambulate 600' safely with LRAD and normal gait pattern to access community.  Baseline: slow antalgic with 2WRW Goal status: IN PROGRESS 11/21/21- no AD needed   5.  [name]. will be able to ascend/descend stairs with 1 HR and reciprocal step pattern safely to access home and community.  Baseline: unable Goal status: IN PROGRESS - pt reports good going up stairs but difficult going down - 11/18/21  6.  [name]. will demonstrate > 19/24 on DGI to demonstrate decreased risk of falls.   Baseline: to be assessed Goal status: IN PROGRESS  7.  [name]. will demonstrate improved functional LE strength by completing 5x STS in <15 seconds.  (MCID 5 seconds) Baseline: 20 seconds  Goal status: IN PROGRESS  8.  [name]. will demonstrate >30/80 on LEFS to demonstrate improved mobility.  Baseline: 18/80 = 77.5% disability  Goal status: IN PROGRESS  9.  [name]. will demonstrate 5/5 LLE strength without pain for safety with gait.  Baseline: see objective Goal status: IN PROGRESS 11/21/21- 4+/5 L hamstring, all other 5/5    PLAN: PT FREQUENCY: 2-3x/week  PT  DURATION: 8 weeks  PLANNED INTERVENTIONS: Therapeutic exercises, Therapeutic activity, Neuromuscular re-education, Balance training, Gait training, Patient/Family education, Joint mobilization, Stair training, Aquatic Therapy, Dry Needling, Electrical stimulation, Cryotherapy, Moist heat, scar mobilization, Taping, Vasopneumatic device, Ultrasound, Ionotophoresis 29m/ml Dexamethasone, Manual therapy, and Re-evaluation  PLAN FOR NEXT SESSION:  continue to progress exercises as tolerated, especially stairs, balance manual therapy focusing on ROM, modalities PRN   ERennie Natter PT, DPT  11/21/2021, 2:20 PM

## 2021-11-23 ENCOUNTER — Ambulatory Visit (INDEPENDENT_AMBULATORY_CARE_PROVIDER_SITE_OTHER): Payer: BC Managed Care – PPO | Admitting: Orthopedic Surgery

## 2021-11-23 DIAGNOSIS — Z96652 Presence of left artificial knee joint: Secondary | ICD-10-CM

## 2021-11-23 MED ORDER — TRAMADOL HCL 50 MG PO TABS: 50.0000 mg | ORAL_TABLET | Freq: Three times a day (TID) | ORAL | 0 refills | Status: DC | PRN

## 2021-11-24 ENCOUNTER — Ambulatory Visit: Payer: BC Managed Care – PPO | Admitting: Physical Therapy

## 2021-11-24 ENCOUNTER — Encounter: Payer: Self-pay | Admitting: Physical Therapy

## 2021-11-24 DIAGNOSIS — M25662 Stiffness of left knee, not elsewhere classified: Secondary | ICD-10-CM

## 2021-11-24 DIAGNOSIS — M6281 Muscle weakness (generalized): Secondary | ICD-10-CM

## 2021-11-24 DIAGNOSIS — M25562 Pain in left knee: Secondary | ICD-10-CM

## 2021-11-24 DIAGNOSIS — R262 Difficulty in walking, not elsewhere classified: Secondary | ICD-10-CM

## 2021-11-24 NOTE — Progress Notes (Signed)
Post-Op Visit Note   Patient: Ernest Navarro.           Date of Birth: 10/27/1960           MRN: [id number] Visit Date: 11/23/2021 PCP: Clinic, General Medical   Assessment & Plan:  Chief Complaint:  Chief Complaint  Patient presents with   Left Knee - Routine Post Op     09/05/21 (11w 2d) Left Total Knee Arthroplasty - Left     Visit Diagnoses: No diagnosis found.  Plan: Patient is a 61 year old male who returns s/p left total knee arthroplasty on 09/05/2021.  He reports continued pains currently but feels he is making good improvement.  Motion improving.  Still working with physical therapy and he is reached max of 120 degrees of flexion and PT.  Taking 1 tramadol and 2 Tylenol every 8 hours for pain control.  He has no issues with codeine cough stairs sequentially but does have a little bit of difficulty going downstairs needing to take each test to get in time.  No issue with getting up from the low chair or toilet seat.  On exam, patient has 0 degrees extension and 110 degrees knee flexion.  Small effusion noted.  Follow-Up Instructions: No follow-ups on file.   Orders:  No orders of the defined types were placed in this encounter.  Meds ordered this encounter  Medications   traMADol (ULTRAM) 50 MG tablet    Sig: Take 1 tablet (50 mg total) by mouth every 8 (eight) hours as needed.    Dispense:  40 tablet    Refill:  0    Not to exceed 5 additional fills before 02/04/2024PLEASE REFILL.    Imaging: No results found.  PMFS History: 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   Arthritis of right knee    S/P total knee arthroplasty, right 10/28/2020   Obesity 06/29/2015   Obstructive sleep apnea 05/06/2015   Seasonal and perennial allergic rhinitis 05/06/2015   Hyperglycemia 06/19/2012   Essential hypertension, benign 06/19/2012   Gout 06/19/2012   Nocturia 06/19/2012   Past  Medical History:  Diagnosis Date   Arthritis    bilateral knees   Essential hypertension, benign 06/19/2012   Gout 06/19/2012   Headache    Hyperglycemia 06/19/2012   March 2014 A1c 6.5 (repeat needed)    Pneumonia    Pre-diabetes    Sleep apnea     Family History  Problem Relation Age of Onset   Lymphoma Brother    Diabetes Maternal Grandmother    Colon cancer Neg Hx    Pancreatic cancer Neg Hx    Stomach cancer Neg Hx    Esophageal cancer Neg Hx    Liver disease Neg Hx     Past Surgical History:  Procedure Laterality Date   KNEE SURGERY Right    x3 arthroscopies   TOTAL KNEE ARTHROPLASTY Right 10/28/2020   Procedure: RIGHT TOTAL KNEE ARTHROPLASTY;  Surgeon: Cammy Copa, MD;  Location: [facility];  Service: Orthopedics;  Laterality: Right;   TOTAL KNEE ARTHROPLASTY Left 09/05/2021   Procedure: LEFT TOTAL KNEE ARTHROPLASTY;  Surgeon: Cammy Copa, MD;  Location: [facility];  Service: Orthopedics;  Laterality: Left;   Social History   Occupational History   Not on file  Tobacco Use   Smoking status: Never   Smokeless tobacco: Never  Vaping Use  Vaping Use: Never used  Substance and Sexual Activity   Alcohol use: No    Alcohol/week: 0.0 standard drinks of alcohol   Drug use: No   Sexual activity: Not on file

## 2021-11-24 NOTE — Therapy (Signed)
OUTPATIENT PHYSICAL THERAPY Treatment Note  Patient Name: Ernest Navarro. MRN: [id number] DOB:01-Aug-1960, 61 y.o., male Today's Date: 11/24/2021   PT End of Session - 11/24/21 1317     Visit Number 12    Number of Visits 18    Date for PT Re-Evaluation 12/05/21    PT Start Time 1315    PT Stop Time 1408    PT Time Calculation (min) 53 min    Activity Tolerance Patient tolerated treatment well    Behavior During Therapy [facility] for tasks assessed/performed               Past Medical History:  Diagnosis Date   Arthritis    bilateral knees   Essential hypertension, benign 06/19/2012   Gout 06/19/2012   Headache    Hyperglycemia 06/19/2012   March 2014 A1c 6.5 (repeat needed)    Pneumonia    Pre-diabetes    Sleep apnea    Past Surgical History:  Procedure Laterality Date   KNEE SURGERY Right    x3 arthroscopies   TOTAL KNEE ARTHROPLASTY Right 10/28/2020   Procedure: RIGHT TOTAL KNEE ARTHROPLASTY;  Surgeon: Meredith Pel, MD;  Location: [place];  Service: Orthopedics;  Laterality: Right;   TOTAL KNEE ARTHROPLASTY Left 09/05/2021   Procedure: LEFT TOTAL KNEE ARTHROPLASTY;  Surgeon: Meredith Pel, MD;  Location: [place];  Service: Orthopedics;  Laterality: Left;   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   Arthritis of right knee    S/P total knee arthroplasty, right 10/28/2020   Obesity 06/29/2015   Obstructive sleep apnea 05/06/2015   Seasonal and perennial allergic rhinitis 05/06/2015   Hyperglycemia 06/19/2012   Essential hypertension, benign 06/19/2012   Gout 06/19/2012   Nocturia 06/19/2012    PCP: General medical clinic  REFERRING PROVIDER: Meredith Pel MD  REFERRING DIAG: [phone] (ICD-10-CM) - S/P total knee arthroplasty, left  THERAPY DIAG:  Acute pain of left knee  Stiffness of left knee, not elsewhere classified  Muscle weakness  (generalized)  Difficulty in walking, not elsewhere classified  Rationale for Evaluation and Treatment Rehabilitation  ONSET DATE: 09/05/2021  SUBJECTIVE:   SUBJECTIVE STATEMENT: Pt saw orthopedist yesterday, who was pleased with progress, said main concern was swelling.  Plan to return to work part time on 12/02/21 and continue therapy for an additional 4 weeks until 12/23/21, returns to MD on 12/25/21, and planning full return to work 01/01/22.    PERTINENT HISTORY: S/p L TKA on 09/05/2021, R TKA on 10/28/2020, gout, obesity  PAIN:  Are you having pain? Yes: NPRS scale: 4/10 Pain location: L knee  PRECAUTIONS: None  WEIGHT BEARING RESTRICTIONS No  FALLS:  Has patient fallen in last 6 months? No  LIVING ENVIRONMENT: Lives with: lives alone Lives in: House/apartment Stairs: Yes: Internal: 15 steps; on left going up Has following equipment at home: Walker - 2 wheeled and bed side commode  OCCUPATION: works from home, Personnel officer  PLOF: Independent  PATIENT GOALS full ROM in L knee like R - the same strength, flexibility, extension and flexion.   Return to golf.    OBJECTIVE:   DIAGNOSTIC FINDINGS: 09/19/2021 XR L knee: AP and lateral views of left knee reviewed.  Left total knee prosthesis in  good position alignment without any complicating features.  No evidence of loosening noted.  No abnormal patellar height.  No periprosthetic fracture noted.  PATIENT SURVEYS:  LEFS 18/80.  22.5% ability = 77.5% disability  COGNITION:  Overall cognitive status: Within functional limits for tasks assessed     SENSATION: Mild numbness lateral side L knee  EDEMA:  Circumferential: R 51.5 cm, L 52.5 cm  POSTURE: rounded shoulders and forward head  PALPATION: Tightness along scar  LOWER EXTREMITY ROM:  (A)ctive/(P)assive ROM Right eval Left eval Left 10/12/2021 Left 10/19/2021 Left 11/03/21 Left  11/15/2021  Knee flexion 110A, 115 P 98A, 100P 110 P 118 119 Pre-manual 110,  post-manual 119  Knee extension 0 5 4 0 0 0   (Blank rows = not tested)  LOWER EXTREMITY MMT:  MMT Right eval Left eval Left 11/21/21  Hip flexion 5 4* 5  Hip extension     Hip abduction 4+ 4+ 5  Hip adduction _0 Knee flexion 5 3+ 4+  Knee extension 5 4+  5  Ankle dorsiflexion 5 4* 5  Ankle plantarflexion      (Blank rows = not tested) *pain   FUNCTIONAL TESTS:  5 times sit to stand: 20 seconds without UE assist  GAIT: Distance walked: 50 Assistive device utilized: Walker - 2 wheeled Level of assistance: Modified independence Comments:  slow gait, reciprocal stepping, slightly antalgic, 0.48 m/s    TODAY'S TREATMENT: 11/24/2021 Therapeutic Exercise: to improve strength and mobility.  Bike L3 x 6 min  Step downs (no riser) 2 x 10 bil for eccentric strengthening.  Cybex leg extensions - starting concentric phase 20# with bil LE, then dropping RLE to focus on isometric then eccentric lowering.  Repeated other side as well 2 x 10 bil  Cybex knee flexions - starting concentric phase 20# with bil LE, then dropping RLE to focus on eccentric phase.  Squats 2 x 10  Single leg RDLS 2 x 10 bil - in front of mat table with SBA for safety- unable to complete second set on L due to muscle fatigue.  Vasopneumatic: Gameready at end of session for post session soreness/edema.  15 min, low compression, 34 deg (coldest).  11/21/21 Therapeutic Exercise: to improve strength and mobility.   Bike L3 x 6 min  Step ups 2 x 10 bil (2 risers)  Cybex leg extensions - starting concentric phase 20# with bil LE, then dropping RLE to focus on isometric then eccentric lowering.   Cybex knee flexions - starting concentric phase 20# with bil LE, then dropping RLE to focus on eccentric phase.  Leg press 25# 2 x 10 bil LE Calf raise on leg press 25# bil LE Manual Therapy: to decrease muscle spasm and pain and improve mobility.  Scar tissue mobilization, patellar mobs, IASTM with s/s tools to distal  quad.   Vasopneumatic: Gameready at end of session for post session soreness/edema.  10 min, low compression, 34 deg (coldest).  11/18/21 Therapeutic Exercise: Bike L3x80mn Leg extension L LE 2 x 10 10#, BLE 20# 2x10 Eccentric step down 6' step 2x10 Lat step down w/ R leg tap to ground 2x10 Leg curls BLE 25# 2x10, LLE 10# 2x10 Standing L SLS with opp leg reach to colored dots 1/2 circle pattern 3x  Manual Therapy: STM to R proximal quads, focused on TP along RF Vasopneumatic: Gameready at end of session for post session soreness/edema.  10 min, low compression, 34 deg (coldest).   11/15/2021 Therapeutic Exercise: to improve strength and mobility.  Demo, verbal and tactile cues throughout for technique. Bike L2 x 6 min seat 11 Step ups (  2 risers) 2 x 10 bil -2 UE support needed Side step ups (1 riser) 2 x 10 bil - 1UE support Runners stretch between sets of step ups.  Step down (no riser) 2 x 10 bil, able to tap heel to floor on L today.  Manual Therapy: to decrease muscle spasm and pain and improve mobility STM/TPR L quads, patellar mobs, scar tissue mobilization, skilled palpation and monitoring during dry needling. Trigger Point Dry-Needling  Treatment instructions: Expect mild to moderate muscle soreness.  Patient verbalized understanding of these instructions and education. Patient Consent Given: Yes Education handout provided: Yes Muscles treated: R rectus femoris Treatment response/outcome: Twitch Response Elicited and Palpable Increase in Muscle Length Vasopneumatic: Gameready at end of session for post session soreness/edema.  10 min, low compression, 34 deg (coldest).       PATIENT EDUCATION:  Education details: continue HEP as tolerated Person educated: Patient Education method: Explanation Education comprehension: verbalized understanding   HOME EXERCISE PROGRAM: Access Code: [email or web address]   ASSESSMENT:  CLINICAL IMPRESSION: Patient is making good progress,  today L knee PROM 0-120.  Continued focus on eccentric strengthening, noted some weakness still in R quads/hamstrings as well, so exercises this side as well.  Significant improvement today in eccentric strength stepping down from aerobic step on L.   Game ready at end of session for post-session soreness and edema.  Pt would continue to benefit from PT in order to improve function.   OBJECTIVE IMPAIRMENTS Abnormal gait, decreased balance, decreased endurance, decreased mobility, difficulty walking, decreased ROM, decreased strength, increased edema, increased fascial restrictions, increased muscle spasms, impaired flexibility, impaired sensation, and pain.   ACTIVITY LIMITATIONS carrying, lifting, bending, sitting, standing, squatting, stairs, transfers, and locomotion level  PARTICIPATION LIMITATIONS: meal prep, cleaning, laundry, driving, shopping, and community activity  PERSONAL FACTORS 1-2 comorbidities: R TKA, obesity, gout  are also affecting patient's functional outcome.   REHAB POTENTIAL: Excellent  CLINICAL DECISION MAKING: Stable/uncomplicated  EVALUATION COMPLEXITY: Low   GOALS: Goals reviewed with patient? Yes   SHORT TERM GOALS: Target date: 10/24/2021    Independent with initial HEP. Baseline: independent with current HEP.   Goal status: MET   LONG TERM GOALS: Target date: 12/05/2021   Independent with advanced/ongoing HEP to improve outcomes and carryover.  Baseline: needs progression.  Goal status: IN PROGRESS  2.  Ernest Navarro. will demonstrate left knee flexion to 115 deg to ascend/descend stairs. Baseline: 100 Goal status: MET  10/19/2021 -118  3.  Ernest Navarro. will demonstrate full left knee extension for safety with gait. Baseline: lacking 5 deg due to edema Goal status: MET  4.  Ernest Navarro. will be able to ambulate 600' safely with LRAD and normal gait pattern to access community.  Baseline: slow antalgic with 2WRW Goal status: IN  PROGRESS 11/21/21- no AD needed   5.  Ernest Navarro. will be able to ascend/descend stairs with 1 HR and reciprocal step pattern safely to access home and community.  Baseline: unable Goal status: IN PROGRESS - pt reports good going up stairs but difficult going down - 11/18/21  6.  Ernest Navarro. will demonstrate > 19/24 on DGI to demonstrate decreased risk of falls.   Baseline: to be assessed Goal status: IN PROGRESS  7.  Ernest Navarro. will demonstrate improved functional LE strength by completing 5x STS in <15 seconds.  (MCID 5 seconds) Baseline: 20 seconds  Goal status: IN PROGRESS  8.  Ernest Navarro. will demonstrate >30/80 on LEFS to demonstrate improved mobility.  Baseline: 18/80 = 77.5% disability  Goal status: IN PROGRESS  9.  Ernest Navarro. will demonstrate 5/5 LLE strength without pain for safety with gait.  Baseline: see objective Goal status: IN PROGRESS 11/21/21- 4+/5 L hamstring, all other 5/5    PLAN: PT FREQUENCY: 2-3x/week  PT DURATION: 8 weeks  PLANNED INTERVENTIONS: Therapeutic exercises, Therapeutic activity, Neuromuscular re-education, Balance training, Gait training, Patient/Family education, Joint mobilization, Stair training, Aquatic Therapy, Dry Needling, Electrical stimulation, Cryotherapy, Moist heat, scar mobilization, Taping, Vasopneumatic device, Ultrasound, Ionotophoresis 84m/ml Dexamethasone, Manual therapy, and Re-evaluation  PLAN FOR NEXT SESSION:  continue to progress exercises as tolerated, especially stairs and eccentric strengthening, modalities PRN   ERennie Natter PT, DPT  11/24/2021, 2:09 PM

## 2021-11-25 ENCOUNTER — Encounter: Payer: Self-pay | Admitting: Orthopedic Surgery

## 2021-11-27 NOTE — Therapy (Addendum)
PHYSICAL THERAPY DISCHARGE SUMMARY  Visits from Start of Care: 13  Current functional level related to goals / functional outcomes: Improved L knee ROM 0-119, improved gait, functional mobility and LLE strength   Remaining deficits: Pain, difficulty on stairs   Education / Equipment: HEP  Plan: Patient agrees to discharge.  He called on 12/14/2021 to report he had been released by his surgeon and cancelled remaining visits.     Rennie Natter, PT, DPT 4:37 PM 12/14/2021    OUTPATIENT PHYSICAL THERAPY Treatment Note  Patient Name: Ernest Navarro. MRN: [id number] DOB:October 03, 1960, 61 y.o., male Today's Date: 11/28/2021   PT End of Session - 11/28/21 1309     Visit Number 13    Number of Visits 18    Date for PT Re-Evaluation 12/05/21    PT Start Time 9476    PT Stop Time 1350    PT Time Calculation (min) 42 min    Activity Tolerance Patient tolerated treatment well    Behavior During Therapy [facility] for tasks assessed/performed                Past Medical History:  Diagnosis Date   Arthritis    bilateral knees   Essential hypertension, benign 06/19/2012   Gout 06/19/2012   Headache    Hyperglycemia 06/19/2012   March 2014 A1c 6.5 (repeat needed)    Pneumonia    Pre-diabetes    Sleep apnea    Past Surgical History:  Procedure Laterality Date   KNEE SURGERY Right    x3 arthroscopies   TOTAL KNEE ARTHROPLASTY Right 10/28/2020   Procedure: RIGHT TOTAL KNEE ARTHROPLASTY;  Surgeon: Meredith Pel, MD;  Location: [place];  Service: Orthopedics;  Laterality: Right;   TOTAL KNEE ARTHROPLASTY Left 09/05/2021   Procedure: LEFT TOTAL KNEE ARTHROPLASTY;  Surgeon: Meredith Pel, MD;  Location: [place];  Service: Orthopedics;  Laterality: Left;   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   Arthritis of right knee    S/P total knee arthroplasty, right 10/28/2020   Obesity  06/29/2015   Obstructive sleep apnea 05/06/2015   Seasonal and perennial allergic rhinitis 05/06/2015   Hyperglycemia 06/19/2012   Essential hypertension, benign 06/19/2012   Gout 06/19/2012   Nocturia 06/19/2012    PCP: General medical clinic  REFERRING PROVIDER: Meredith Pel MD  REFERRING DIAG: [phone] (ICD-10-CM) - S/P total knee arthroplasty, left  THERAPY DIAG:  Acute pain of left knee  Stiffness of left knee, not elsewhere classified  Muscle weakness (generalized)  Difficulty in walking, not elsewhere classified  Rationale for Evaluation and Treatment Rehabilitation  ONSET DATE: 09/05/2021  SUBJECTIVE:   SUBJECTIVE STATEMENT: Pt states knee is still hurting and left hip also a little today  Per last MD appt:  Plan to return to work part time on 12/02/21 and continue therapy for an additional 4 weeks until 12/23/21, returns to MD on 12/25/21, and planning full return to work 01/01/22.    PERTINENT HISTORY: S/p L TKA on 09/05/2021, R TKA on 10/28/2020, gout, obesity  PAIN:  Are you having pain? Yes: NPRS scale: 3-4/10 Pain location: L knee  PRECAUTIONS: None  WEIGHT BEARING RESTRICTIONS No  FALLS:  Has patient fallen in last 6 months? No  LIVING ENVIRONMENT: Lives with: lives alone Lives in: House/apartment Stairs: Yes: Internal: 15 steps; on left going up Has following equipment at home:  Walker - 2 wheeled and bed side commode  OCCUPATION: works from home, Personnel officer  PLOF: Independent  PATIENT GOALS full ROM in L knee like R - the same strength, flexibility, extension and flexion.   Return to golf.    OBJECTIVE:   DIAGNOSTIC FINDINGS: 09/19/2021 XR L knee: AP and lateral views of left knee reviewed.  Left total knee prosthesis in  good position alignment without any complicating features.  No evidence of loosening noted.  No abnormal patellar height.  No periprosthetic fracture noted.  PATIENT SURVEYS:  LEFS 18/80.  22.5% ability = 77.5%  disability  COGNITION:  Overall cognitive status: Within functional limits for tasks assessed     SENSATION: Mild numbness lateral side L knee  EDEMA:  Circumferential: R 51.5 cm, L 52.5 cm  POSTURE: rounded shoulders and forward head  PALPATION: Tightness along scar  LOWER EXTREMITY ROM:  (A)ctive/(P)assive ROM Right eval Left eval Left 10/12/2021 Left 10/19/2021 Left 11/03/21 Left  11/15/2021  Knee flexion 110A, 115 P 98A, 100P 110 P 118 119 Pre-manual 110, post-manual 119  Knee extension 0 5 4 0 0 0   (Blank rows = not tested)  LOWER EXTREMITY MMT:  MMT Right eval Left eval Left 11/21/21  Hip flexion 5 4* 5  Hip extension     Hip abduction 4+ 4+ 5  Hip adduction 5 5 5   Knee flexion 5 3+ 4+  Knee extension 5 4+  5  Ankle dorsiflexion 5 4* 5  Ankle plantarflexion      (Blank rows = not tested) *pain   FUNCTIONAL TESTS:  5 times sit to stand: 20 seconds without UE assist  GAIT: Distance walked: 50 Assistive device utilized: Walker - 2 wheeled Level of assistance: Modified independence Comments:  slow gait, reciprocal stepping, slightly antalgic, 0.48 m/s    TODAY'S TREATMENT:  11/28/21 Therapeutic Exercise: to improve strength and mobility.  Bike L3 x 6 min Step downs (no riser) 2 x 10 bil for eccentric strengthening.  Squats 2 x 10 Single leg RDLs with one UE support for balance facing mat table - 1x10, 2x5 L, 2x 10 R Cybex knee flexions - concentric phase 20# with bil LE, then dropping RLE to focus on eccentric phase. X 10 each Cybex leg extensions - concentric phase 20# with bil LE, then dropping RLE to focus on isometric then eccentric lowering. X 10 ea; bil   Vasopneumatic: Gameready at end of session for post session soreness/edema.  15 min, low compression, 34 deg (coldest).    11/24/2021 Therapeutic Exercise: to improve strength and mobility.  Bike L3 x 6 min  Step downs (no riser) 2 x 10 bil for eccentric strengthening.  Cybex leg extensions  - starting concentric phase 20# with bil LE, then dropping RLE to focus on isometric then eccentric lowering.  Repeated other side as well 2 x 10 bil  Cybex knee flexions - starting concentric phase 20# with bil LE, then dropping RLE to focus on eccentric phase.  Squats 2 x 10  Single leg RDLS 2 x 10 bil - in front of mat table with SBA for safety- unable to complete second set on L due to muscle fatigue.  Vasopneumatic: Gameready at end of session for post session soreness/edema.  15 min, low compression, 34 deg (coldest).  11/21/21 Therapeutic Exercise: to improve strength and mobility.   Bike L3 x 6 min  Step ups 2 x 10 bil (2 risers)  Cybex leg extensions - starting concentric phase  20# with bil LE, then dropping RLE to focus on isometric then eccentric lowering.   Cybex knee flexions - starting concentric phase 20# with bil LE, then dropping RLE to focus on eccentric phase.  Leg press 25# 2 x 10 bil LE Calf raise on leg press 25# bil LE Manual Therapy: to decrease muscle spasm and pain and improve mobility.  Scar tissue mobilization, patellar mobs, IASTM with s/s tools to distal quad.   Vasopneumatic: Gameready at end of session for post session soreness/edema.  10 min, low compression, 34 deg (coldest).       PATIENT EDUCATION:  Education details: continue HEP as tolerated Person educated: Patient Education method: Explanation Education comprehension: verbalized understanding   HOME EXERCISE PROGRAM: Access Code: [email or web address]   ASSESSMENT:  CLINICAL IMPRESSION: Ernest Navarro still reports lateral Left knee pain but able to tolerate TE with no increased pain. Visible weakness with eccentric activities bil L>R. Form with step downs and squats improves with second set. He will benefit from ongoing progression of these.  OBJECTIVE IMPAIRMENTS Abnormal gait, decreased balance, decreased endurance, decreased mobility, difficulty walking, decreased ROM, decreased strength, increased edema,  increased fascial restrictions, increased muscle spasms, impaired flexibility, impaired sensation, and pain.   ACTIVITY LIMITATIONS carrying, lifting, bending, sitting, standing, squatting, stairs, transfers, and locomotion level  PARTICIPATION LIMITATIONS: meal prep, cleaning, laundry, driving, shopping, and community activity  PERSONAL FACTORS 1-2 comorbidities: R TKA, obesity, gout  are also affecting patient's functional outcome.   REHAB POTENTIAL: Excellent  CLINICAL DECISION MAKING: Stable/uncomplicated  EVALUATION COMPLEXITY: Low   GOALS: Goals reviewed with patient? Yes   SHORT TERM GOALS: Target date: 10/24/2021    Independent with initial HEP. Baseline: independent with current HEP.   Goal status: MET   LONG TERM GOALS: Target date: 12/05/2021   Independent with advanced/ongoing HEP to improve outcomes and carryover.  Baseline: needs progression.  Goal status: IN PROGRESS  2.  Ernest Navarro. will demonstrate left knee flexion to 115 deg to ascend/descend stairs. Baseline: 100 Goal status: MET  10/19/2021 -118  3.  Ernest Navarro. will demonstrate full left knee extension for safety with gait. Baseline: lacking 5 deg due to edema Goal status: MET  4.  Ernest Navarro. will be able to ambulate 600' safely with LRAD and normal gait pattern to access community.  Baseline: slow antalgic with 2WRW Goal status: IN PROGRESS 11/21/21- no AD needed   5.  Ernest Navarro. will be able to ascend/descend stairs with 1 HR and reciprocal step pattern safely to access home and community.  Baseline: unable Goal status: IN PROGRESS - pt reports good going up stairs but difficult going down - 11/18/21  6.  Ernest Navarro. will demonstrate > 19/24 on DGI to demonstrate decreased risk of falls.   Baseline: to be assessed Goal status: IN PROGRESS  7.  Ernest Navarro. will demonstrate improved functional LE strength by completing 5x STS in <15 seconds.  (MCID 5  seconds) Baseline: 20 seconds  Goal status: IN PROGRESS  8.  Ernest Navarro. will demonstrate >30/80 on LEFS to demonstrate improved mobility.  Baseline: 18/80 = 77.5% disability  Goal status: IN PROGRESS  9.  Ernest Navarro. will demonstrate 5/5 LLE strength without pain for safety with gait.  Baseline: see objective Goal status: IN PROGRESS 11/21/21- 4+/5 L hamstring, all other 5/5    PLAN: PT FREQUENCY: 2-3x/week  PT DURATION: 8 weeks  PLANNED INTERVENTIONS:  Therapeutic exercises, Therapeutic activity, Neuromuscular re-education, Balance training, Gait training, Patient/Family education, Joint mobilization, Stair training, Aquatic Therapy, Dry Needling, Electrical stimulation, Cryotherapy, Moist heat, scar mobilization, Taping, Vasopneumatic device, Ultrasound, Ionotophoresis 43m/ml Dexamethasone, Manual therapy, and Re-evaluation  PLAN FOR NEXT SESSION:  continue to progress exercises as tolerated, especially stairs and eccentric strengthening, modalities PRN   Silas Sedam, PT, DPT  11/28/2021, 1:47 PM

## 2021-11-28 ENCOUNTER — Ambulatory Visit: Payer: BC Managed Care – PPO | Admitting: Physical Therapy

## 2021-11-28 DIAGNOSIS — R262 Difficulty in walking, not elsewhere classified: Secondary | ICD-10-CM

## 2021-11-28 DIAGNOSIS — M25662 Stiffness of left knee, not elsewhere classified: Secondary | ICD-10-CM

## 2021-11-28 DIAGNOSIS — M25562 Pain in left knee: Secondary | ICD-10-CM

## 2021-11-28 DIAGNOSIS — M6281 Muscle weakness (generalized): Secondary | ICD-10-CM

## 2021-11-30 ENCOUNTER — Encounter: Payer: Self-pay | Admitting: Orthopedic Surgery

## 2021-11-30 ENCOUNTER — Telehealth: Payer: Self-pay | Admitting: Orthopedic Surgery

## 2021-11-30 ENCOUNTER — Other Ambulatory Visit: Payer: Self-pay | Admitting: Surgical

## 2021-11-30 MED ORDER — TRAMADOL HCL 50 MG PO TABS: 100.0000 mg | ORAL_TABLET | Freq: Three times a day (TID) | ORAL | 0 refills | Status: DC | PRN

## 2021-11-30 NOTE — Telephone Encounter (Signed)
Lets try to see him back 3 weeks to reassess.  I do think it is reasonable for 3 months out to start weaning pain medicine.  Thanks

## 2021-11-30 NOTE — Telephone Encounter (Signed)
He is about 3 months out from knee replacement.  Need to start weaning him off of the tramadol so that we can hopefully be completely off tramadol by October.  We will plan on doing 2 tablets every 8 hours for 1 week then 2 tablets every 12 hours for 1 week then 1 tablet every 12 hours for 1 week and then 1 tablet daily for final week.  He can take other medication such as muscle relaxer, NSAID, Tylenol to help with pain control but do not really want to be on tramadol much longer.

## 2021-11-30 NOTE — Telephone Encounter (Signed)
Patient called advised he started taking 1 Tramadol  every 8 hours but it wasn't helping with the pain. Patient said he went  back to taking 2 every 8 hours like before and it does help with the pain. Patient said he the pain is worse when he goes to rehab. Patient said he is running out of his pain medicine and would like the Rx written so that he can take 2 tabs every 8 hours. The number to contact patient is [phone]

## 2021-11-30 NOTE — Telephone Encounter (Signed)
IC discussed with patient per Leane Call note. He stated that this knee is more painful than the last one he had done and that this is not his first time around. Patient was very adamant about speaking with one of you directly about this. He states that the pain is too severe for him to come off tramadol right now.

## 2021-12-01 ENCOUNTER — Encounter: Payer: BC Managed Care – PPO | Admitting: Physical Therapy

## 2021-12-01 NOTE — Telephone Encounter (Signed)
IC patient and advised per your note Patient stated "well let me tell you something, I will NOT be participating in physical therapy because I cannot do my therapy without the pain medication so you can relay that message to Dorene Grebe and let him decide how progressive he wants me to be at my next appointment at the end of September" This is the second call I have made to patient in 2 days were he speaks very aggressively about pain medication. Could you or Franky Macho please call patient?

## 2021-12-06 ENCOUNTER — Ambulatory Visit: Payer: BC Managed Care – PPO | Admitting: Physical Therapy

## 2021-12-12 ENCOUNTER — Ambulatory Visit: Payer: BC Managed Care – PPO | Admitting: Physical Therapy

## 2021-12-13 ENCOUNTER — Other Ambulatory Visit: Payer: Self-pay | Admitting: Surgical

## 2021-12-14 ENCOUNTER — Ambulatory Visit: Payer: BC Managed Care – PPO | Admitting: Physical Therapy

## 2021-12-14 NOTE — Telephone Encounter (Signed)
I talked to Ernest Navarro.  He is down to 1 tramadol a day.  Has decided to stop going to therapy on his own because that was actually hurting him more.  I think is a good idea.  Plan to finalize the pain medicine tapering schedule next week.  At his appointment.

## 2021-12-15 ENCOUNTER — Encounter: Payer: Self-pay | Admitting: Orthopedic Surgery

## 2021-12-19 NOTE — Telephone Encounter (Signed)
What do you want to do about this?

## 2021-12-20 ENCOUNTER — Encounter: Payer: BC Managed Care – PPO | Admitting: Physical Therapy

## 2021-12-21 NOTE — Telephone Encounter (Signed)
I think this is not something I have ever prescribed in 23 years and I do not think it is something I am very comfortable with right now.

## 2021-12-26 ENCOUNTER — Encounter: Payer: Self-pay | Admitting: Surgical

## 2021-12-26 ENCOUNTER — Ambulatory Visit (INDEPENDENT_AMBULATORY_CARE_PROVIDER_SITE_OTHER): Payer: BC Managed Care – PPO | Admitting: Surgical

## 2021-12-26 DIAGNOSIS — Z96652 Presence of left artificial knee joint: Secondary | ICD-10-CM

## 2021-12-26 MED ORDER — CYCLOBENZAPRINE HCL 10 MG PO TABS: 10.0000 mg | ORAL_TABLET | Freq: Every day | ORAL | 0 refills | Status: DC

## 2021-12-26 NOTE — Progress Notes (Signed)
Post-Op Visit Note   Patient: Ernest Navarro.           Date of Birth: Dec 06, 1960           MRN: [id number] Visit Date: 12/26/2021 PCP: Clinic, General Medical   Assessment & Plan:  Chief Complaint: No chief complaint on file.  Visit Diagnoses:  1. S/P total knee arthroplasty, left     Plan: Patient is a 61 year old male who presents s/p left total knee arthroplasty on 09/05/2021.  Overall he is progressing well.  He has discontinued physical therapy regularly which has helped with his pain.  He is taking Celebrex once daily as well as occasional Tylenol and tramadol.  Takes tramadol 1-2 times per day max.  Not taking any muscle relaxers.  He is complaining of primarily lateral sided knee pain.  Knee is sore in the morning.  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  After about 5 hours it is very sore but tolerable.  He is having some trouble sleeping and wakes up about every 2 hours due to pain.  No fevers or chills.  No changes in the appearance of the incision.  Overall he does feel pain is progressively improving.  He is able to get up from a low toilet without any difficulty.  He is able to take stairs sequentially both going up and down.  Going down is a little bit more difficult but this is getting easier.  He is doing an exercise program consisting of pool workouts, going to the [facility] 3 times per week, going to stretch them 2 times per week which he just started in the last week and he feels this is helping.  On exam, patient has incision that is well-healed from prior surgery.  No effusion.  No calf tenderness.  Negative Homans' sign.  He is able to perform straight leg raise with no extensor lag.  Quad strength rated 5/5.  Range of motion from 0 degrees extension to 120 degrees of knee flexion.  Has a little bit of tenderness over the lateral femoral condyle with a little bit of crepitus noted with passive motion of the knee.  Plan is continue  with work restriction as he is having a lot of increased pain after 5 hours of working.  We will continue with working to wean off the tramadol altogether.  He is okay for 1 more prescription after he finishes up this prescription.  He did bring his bottle and to show how many pills he has left.  After this upcoming prescription, no more tramadol and he is agreeable to this.  He has excellent range of motion on exam today without any hint of infection.  Does have a little bit of irritation and pain over the lateral femoral condyle that may be some IT band bursitis.  Recommended IT stretching exercises and demonstrated this for him in the clinic today.  Work note sent to his MyChart with plan to continue 5-hour workdays up until 02/01/2022 and then he will return to full duty.  Follow-up in 4 weeks for clinical recheck with Dr. Marlou Sa.  Prescribed Flexeril today to see if this will help with some of his pain while sleeping.  Follow-Up Instructions: No follow-ups on file.   Orders:  No orders of the defined types were placed in this encounter.  No orders of the defined types were placed in this encounter.   Imaging: No  results found.  PMFS History: 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   Arthritis of right knee    S/P total knee arthroplasty, right 10/28/2020   Obesity 06/29/2015   Obstructive sleep apnea 05/06/2015   Seasonal and perennial allergic rhinitis 05/06/2015   Hyperglycemia 06/19/2012   Essential hypertension, benign 06/19/2012   Gout 06/19/2012   Nocturia 06/19/2012   Past Medical History:  Diagnosis Date   Arthritis    bilateral knees   Essential hypertension, benign 06/19/2012   Gout 06/19/2012   Headache    Hyperglycemia 06/19/2012   March 2014 A1c 6.5 (repeat needed)    Pneumonia    Pre-diabetes    Sleep apnea     Family History  Problem Relation Age of Onset   Lymphoma Brother     Diabetes Maternal Grandmother    Colon cancer Neg Hx    Pancreatic cancer Neg Hx    Stomach cancer Neg Hx    Esophageal cancer Neg Hx    Liver disease Neg Hx     Past Surgical History:  Procedure Laterality Date   KNEE SURGERY Right    x3 arthroscopies   TOTAL KNEE ARTHROPLASTY Right 10/28/2020   Procedure: RIGHT TOTAL KNEE ARTHROPLASTY;  Surgeon: Meredith Pel, MD;  Location: [place];  Service: Orthopedics;  Laterality: Right;   TOTAL KNEE ARTHROPLASTY Left 09/05/2021   Procedure: LEFT TOTAL KNEE ARTHROPLASTY;  Surgeon: Meredith Pel, MD;  Location: [place];  Service: Orthopedics;  Laterality: Left;   Social History   Occupational History   Not on file  Tobacco Use   Smoking status: Never   Smokeless tobacco: Never  Vaping Use   Vaping Use: Never used  Substance and Sexual Activity   Alcohol use: No    Alcohol/week: 0.0 standard drinks of alcohol   Drug use: No   Sexual activity: Not on file

## 2022-01-10 ENCOUNTER — Other Ambulatory Visit: Payer: Self-pay | Admitting: Surgical

## 2022-01-10 ENCOUNTER — Telehealth: Payer: Self-pay | Admitting: Orthopedic Surgery

## 2022-01-10 NOTE — Telephone Encounter (Signed)
Pt called requesting a refill of tramadol. Please send to pharmacy on file. Pt phone number is [phone].

## 2022-01-11 ENCOUNTER — Telehealth: Payer: Self-pay | Admitting: Orthopedic Surgery

## 2022-01-11 NOTE — Telephone Encounter (Signed)
Patient aware this was called in  

## 2022-01-11 NOTE — Telephone Encounter (Signed)
Pt called about an update on his pain medication. Asked for this message to be sent urgent. Pt states he has no pain medication. Please send to pharmacy on file. Pt asked for a call when meds have been sent to pharmacy.Pt phone number is [phone].

## 2022-01-11 NOTE — Telephone Encounter (Signed)
I refilled tramadol.  Just as we discussed at the last appointment, this should be his last refill of tramadol before we completely discontinue this.

## 2022-01-11 NOTE — Telephone Encounter (Signed)
I called patient and advised. 

## 2022-01-11 NOTE — Telephone Encounter (Signed)
I called patient and advised, sent to pharmacy.

## 2022-01-16 ENCOUNTER — Other Ambulatory Visit: Payer: Self-pay | Admitting: Surgical

## 2022-01-23 ENCOUNTER — Ambulatory Visit (INDEPENDENT_AMBULATORY_CARE_PROVIDER_SITE_OTHER): Payer: BC Managed Care – PPO | Admitting: Orthopedic Surgery

## 2022-01-23 ENCOUNTER — Encounter: Payer: Self-pay | Admitting: Orthopedic Surgery

## 2022-01-23 DIAGNOSIS — Z96652 Presence of left artificial knee joint: Secondary | ICD-10-CM

## 2022-01-26 NOTE — Progress Notes (Signed)
Post-Op Visit Note   Patient: Ernest Navarro.           Date of Birth: 11-11-1960           MRN: [id number] Visit Date: 01/23/2022 PCP: Clinic, General Medical   Assessment & Plan:  Chief Complaint:  Chief Complaint  Patient presents with   Left Knee - Follow-up    09/05/2021 left TKA   Visit Diagnoses:  1. S/P total knee arthroplasty, left     Plan: Amarey is a patient who is now just over 3 months out left total knee replacement.  He states he is coming along.  Has a little bit of lateral pain on the left-hand side at night but overall he is making progress.  Taking tramadol about 1/day.  States the pain is better.  He is walking and doing exercises.  Doing stairs.  He is working from home on a reduced schedule.  Anticipate full schedule on November 1.  Going to stretch zone.  On examination he has excellent range of motion of that left knee from 0 to about 10 5-1 10.  Good stability at 0 and 30 degrees to varus and valgus stress.  Patella tracks well.  Plan at this time is left knee quad strengthening exercises.  The lateral pain I think is something that should run its course.  No effusion in the knee today.  He has about 15-20 tramadol left.  He will follow-up with [place] as needed.  May need occasional tramadol's in the future but currently he is tapering down nicely from all of his narcotic pain medicine.  Not taking tramadol on a daily basis at this time also.  Follow-Up Instructions: No follow-ups on file.   Orders:  No orders of the defined types were placed in this encounter.  No orders of the defined types were placed in this encounter.   Imaging: No results found.  PMFS History: 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   Arthritis of right knee    S/P total knee arthroplasty, right 10/28/2020   Obesity 06/29/2015   Obstructive sleep apnea 05/06/2015   Seasonal and perennial  allergic rhinitis 05/06/2015   Hyperglycemia 06/19/2012   Essential hypertension, benign 06/19/2012   Gout 06/19/2012   Nocturia 06/19/2012   Past Medical History:  Diagnosis Date   Arthritis    bilateral knees   Essential hypertension, benign 06/19/2012   Gout 06/19/2012   Headache    Hyperglycemia 06/19/2012   March 2014 A1c 6.5 (repeat needed)    Pneumonia    Pre-diabetes    Sleep apnea     Family History  Problem Relation Age of Onset   Lymphoma Brother    Diabetes Maternal Grandmother    Colon cancer Neg Hx    Pancreatic cancer Neg Hx    Stomach cancer Neg Hx    Esophageal cancer Neg Hx    Liver disease Neg Hx     Past Surgical History:  Procedure Laterality Date   KNEE SURGERY Right    x3 arthroscopies   TOTAL KNEE ARTHROPLASTY Right 10/28/2020   Procedure: RIGHT TOTAL KNEE ARTHROPLASTY;  Surgeon: Cammy Copa, MD;  Location: [facility];  Service: Orthopedics;  Laterality: Right;   TOTAL KNEE ARTHROPLASTY Left 09/05/2021   Procedure: LEFT TOTAL KNEE ARTHROPLASTY;  Surgeon: Cammy Copa, MD;  Location: [facility];  Service: Orthopedics;  Laterality: Left;   Social History   Occupational History   Not on file  Tobacco Use   Smoking status: Never   Smokeless tobacco: Never  Vaping Use   Vaping Use: Never used  Substance and Sexual Activity   Alcohol use: No    Alcohol/week: 0.0 standard drinks of alcohol   Drug use: No   Sexual activity: Not on file

## 2022-02-01 DIAGNOSIS — Z23 Encounter for immunization: Secondary | ICD-10-CM

## 2022-02-15 ENCOUNTER — Other Ambulatory Visit: Payer: Self-pay | Admitting: Orthopedic Surgery

## 2022-02-22 ENCOUNTER — Telehealth: Payer: Self-pay | Admitting: Orthopedic Surgery

## 2022-02-22 ENCOUNTER — Other Ambulatory Visit: Payer: Self-pay | Admitting: Surgical

## 2022-02-22 NOTE — Telephone Encounter (Signed)
Pt called requesting a refill of tramadol. Please send before the end of the day. Please send to pharmacy on file. Pt phone number is [phone].

## 2022-02-26 ENCOUNTER — Other Ambulatory Visit: Payer: Self-pay | Admitting: Surgical

## 2022-02-27 NOTE — Telephone Encounter (Signed)
Sent in

## 2022-03-20 ENCOUNTER — Other Ambulatory Visit: Payer: Self-pay | Admitting: Surgical

## 2022-04-04 ENCOUNTER — Other Ambulatory Visit: Payer: Self-pay | Admitting: Surgical

## 2022-04-05 ENCOUNTER — Encounter: Payer: Self-pay | Admitting: Family Medicine

## 2022-04-05 ENCOUNTER — Telehealth: Payer: Self-pay | Admitting: Family Medicine

## 2022-04-05 ENCOUNTER — Ambulatory Visit (INDEPENDENT_AMBULATORY_CARE_PROVIDER_SITE_OTHER): Payer: 59 | Admitting: Family Medicine

## 2022-04-05 VITALS — BP 144/90 | HR 91 | Temp 98.4°F | Ht 72.0 in | Wt 321.3 lb

## 2022-04-05 DIAGNOSIS — I10 Essential (primary) hypertension: Secondary | ICD-10-CM

## 2022-04-05 DIAGNOSIS — Z96652 Presence of left artificial knee joint: Secondary | ICD-10-CM

## 2022-04-05 DIAGNOSIS — E119 Type 2 diabetes mellitus without complications: Secondary | ICD-10-CM | POA: Diagnosis not present

## 2022-04-05 DIAGNOSIS — M1712 Unilateral primary osteoarthritis, left knee: Secondary | ICD-10-CM | POA: Diagnosis not present

## 2022-04-05 DIAGNOSIS — Z6841 Body Mass Index (BMI) 40.0 and over, adult: Secondary | ICD-10-CM

## 2022-04-05 DIAGNOSIS — Z7689 Persons encountering health services in other specified circumstances: Secondary | ICD-10-CM

## 2022-04-05 LAB — POCT GLYCOSYLATED HEMOGLOBIN (HGB A1C): Hemoglobin A1C: 7.6 % — AB (ref 4.0–5.6)

## 2022-04-05 MED ORDER — ASPIRIN 81 MG PO TBEC: 81.0000 mg | DELAYED_RELEASE_TABLET | Freq: Every day | ORAL | 1 refills | Status: DC

## 2022-04-05 MED ORDER — METFORMIN HCL 500 MG PO TABS: 500.0000 mg | ORAL_TABLET | Freq: Two times a day (BID) | ORAL | 1 refills | Status: DC

## 2022-04-05 MED ORDER — LOSARTAN POTASSIUM 25 MG PO TABS: 25.0000 mg | ORAL_TABLET | Freq: Every day | ORAL | 1 refills | Status: DC

## 2022-04-05 MED ORDER — AMLODIPINE BESYLATE 10 MG PO TABS: 10.0000 mg | ORAL_TABLET | Freq: Every day | ORAL | 1 refills | Status: DC

## 2022-04-05 MED ORDER — GABAPENTIN 300 MG PO CAPS: 300.0000 mg | ORAL_CAPSULE | Freq: Every day | ORAL | 0 refills | Status: DC | PRN

## 2022-04-05 MED ORDER — TRAMADOL HCL 50 MG PO TABS: 100.0000 mg | ORAL_TABLET | Freq: Every day | ORAL | 0 refills | Status: DC | PRN

## 2022-04-05 NOTE — Telephone Encounter (Signed)
Patient called and requested a refill on his amlodipine. Pharmacy tech states there are no refills left on it or it is expired. Buel Ream

## 2022-04-05 NOTE — Progress Notes (Signed)
New Patient Office Visit  Subjective    Patient ID: Ernest Bolander., male    DOB: 09-20-1960  Age: 62 y.o. MRN: [id number]  CC:  Chief Complaint  Patient presents with   New Patient (Initial Visit)    Patient in office to est PCP - requesting rx rf of BP medication = L knee replacement  x June 5th 23- just released to return to work - still some swelling in L knee and slight pain = history of pre-diabetes about 6 mths ago - wanting to be rechecked due to weight gain.     HPI Ernest Navarro. presents to establish care  Left knee OA/status post TKA Pt reports a hx of left knee replacement June 2023. He has hx of right knee replacement a year previously. He is seeing Dr Marcene Duos with [facility]. He's been released to return to work Nov 2023. He is taking Tylenol prn for pain now. He says he has experienced spasms in the left leg and takes flexeril prn. He used ultram prn and asks for me to refill this. He would like to restart therapy for his left knee. He is still trying to get more use of his knee. Still has some pain and swelling.   Tingling  He reports when he had surgery in June 2023. He had issues with the IV in his left arm and since then, he's had tingling in his fingers of his left hand. He already had left leg tingling in his left leg but seems to have gone away a bit.   Diabetes Pt is concerned with his sugar and asks that his A1c be checked today. He reports a hx of 'prediabetes'. According to his previous A1cs, he's had diabetes for the last 9 years. A1c up from 6.5 to 6.8 to 7.0 in May 2023 to 7.6 today. He was put on Metformin but stopped by a provider for unknown reasons. Pt never had diabetic eye exam. Not taking aspirin or ACEI/ARB. Already taking Atorvastatin 20mg  daily.  Hypertension He has HTN and taking Amlodipine 10mg  for a while. Previous blood pressures have been above goal 150 or higher SBP. Denies chest pains or SOB. No headaches reported. Not checking  blood pressures outside the office.   Outpatient Encounter Medications as of 04/05/2022  Medication Sig   acetaminophen (TYLENOL) 500 MG tablet Take 1,000 mg by mouth every 6 (six) hours as needed for moderate pain.   amLODipine (NORVASC) 10 MG tablet Take 10 mg by mouth daily.   aspirin EC 81 MG tablet Take 1 tablet (81 mg total) by mouth daily. Swallow whole.   atorvastatin (LIPITOR) 20 MG tablet Take 20 mg by mouth daily.   celecoxib (CELEBREX) 100 MG capsule TAKE 1 CAPSULE BY MOUTH TWICE A DAY   cyclobenzaprine (FLEXERIL) 10 MG tablet TAKE 1 TABLET BY MOUTH AT BEDTIME. DO NOT OPERATE A MOTOR VEHICLE AFTER TAKING THIS MEDICATION   losartan (COZAAR) 25 MG tablet Take 1 tablet (25 mg total) by mouth daily.   [DISCONTINUED] gabapentin (NEURONTIN) 300 MG capsule TAKE 1 CAPSULE BY MOUTH THREE TIMES A DAY   [DISCONTINUED] traMADol (ULTRAM) 50 MG tablet Take 2 tablets (100 mg total) by mouth daily as needed.   gabapentin (NEURONTIN) 300 MG capsule Take 1 capsule (300 mg total) by mouth daily as needed.   metFORMIN (GLUCOPHAGE) 500 MG tablet Take 1 tablet (500 mg total) by mouth 2 (two) times daily with a meal.   traMADol (  ULTRAM) 50 MG tablet Take 2 tablets (100 mg total) by mouth daily as needed.   [DISCONTINUED] metFORMIN (GLUCOPHAGE) 500 MG tablet Take 500 mg by mouth 2 (two) times daily with a meal. (Patient not taking: Reported on 04/05/2022)   [DISCONTINUED] tamsulosin (FLOMAX) 0.4 MG CAPS capsule Take 0.4 mg by mouth daily. (Patient not taking: Reported on 04/05/2022)   No facility-administered encounter medications on file as of 04/05/2022.    Past Medical History:  Diagnosis Date   Arthritis    bilateral knees   Class 3 severe obesity due to excess calories with serious comorbidity and body mass index (BMI) of 40.0 to 44.9 in adult [facility]) 11/13/2018   Essential hypertension, benign 06/19/2012   Obstructive sleep apnea syndrome 05/06/2015   Formatting of this note might be different from the  original. Overview: High probability diagnosis based on previous reported sleep study and weight gain with witnessed apneas and loud snoring. We discussed the basic physiology and medical concerns. Plan-schedule sleep study, emphasize driving responsibility and advise weight loss Last Assessment & Plan: Formatting of this note might be different    Other male erectile dysfunction 05/25/2017   Sleep apnea    Type 2 diabetes mellitus without complication, without long-term current use of insulin ([place]) 05/15/2019    Past Surgical History:  Procedure Laterality Date   KNEE SURGERY Right    x3 arthroscopies   TOTAL KNEE ARTHROPLASTY Right 10/28/2020   Procedure: RIGHT TOTAL KNEE ARTHROPLASTY;  Surgeon: Meredith Pel, MD;  Location: [place];  Service: Orthopedics;  Laterality: Right;   TOTAL KNEE ARTHROPLASTY Left 09/05/2021   Procedure: LEFT TOTAL KNEE ARTHROPLASTY;  Surgeon: Meredith Pel, MD;  Location: [place];  Service: Orthopedics;  Laterality: Left;    Family History  Problem Relation Age of Onset   Lymphoma Brother    Diabetes Maternal Grandmother    Colon cancer Neg Hx    Pancreatic cancer Neg Hx    Stomach cancer Neg Hx    Esophageal cancer Neg Hx    Liver disease Neg Hx     Social History   Socioeconomic History   Marital status: Single    Spouse name: Not on file   Number of children: Not on file   Years of education: Not on file   Highest education level: Not on file  Occupational History   Not on file  Tobacco Use   Smoking status: Never   Smokeless tobacco: Never  Vaping Use   Vaping Use: Never used  Substance and Sexual Activity   Alcohol use: No    Alcohol/week: 0.0 standard drinks of alcohol   Drug use: No   Sexual activity: Not on file  Other Topics Concern   Not on file  Social History Narrative   Not on file   Social Determinants of Health   Financial Resource Strain: Not on file  Food Insecurity: Not on file  Transportation Needs: Not on  file  Physical Activity: Not on file  Stress: Not on file  Social Connections: Not on file  Intimate Partner Violence: Not on file    Review of Systems  Constitutional:  Negative for chills and fever.  HENT:  Negative for congestion and sore throat.   Eyes:  Negative for blurred vision and double vision.  Respiratory:  Negative for cough and shortness of breath.   Cardiovascular:  Negative for chest pain and orthopnea.  Genitourinary:  Negative for dysuria and urgency.  Musculoskeletal:  Positive for joint  pain. Negative for neck pain.  Neurological:  Negative for dizziness and headaches.  Psychiatric/Behavioral:  Negative for depression. The patient is not nervous/anxious.   All other systems reviewed and are negative.       Objective    BP (!) 144/90   Pulse 91   Temp 98.4 F (36.9 C)   Ht 6' (1.829 m)   Wt (!) 321 lb 5 oz (145.7 kg)   SpO2 96%   BMI 43.58 kg/m   Physical Exam Vitals and nursing note reviewed.  Constitutional:      Appearance: Normal appearance. He is obese.  HENT:     Head: Normocephalic and atraumatic.     Right Ear: Tympanic membrane, ear canal and external ear normal.     Left Ear: Tympanic membrane, ear canal and external ear normal.     Nose: Nose normal.     Mouth/Throat:     Mouth: Mucous membranes are moist.     Pharynx: Oropharynx is clear.  Eyes:     Extraocular Movements: Extraocular movements intact.     Conjunctiva/sclera: Conjunctivae normal.     Pupils: Pupils are equal, round, and reactive to light.  Cardiovascular:     Rate and Rhythm: Normal rate and regular rhythm.     Pulses: Normal pulses.     Heart sounds: Normal heart sounds.  Pulmonary:     Effort: Pulmonary effort is normal.     Breath sounds: Normal breath sounds.  Abdominal:     General: Bowel sounds are normal.     Palpations: Abdomen is soft.  Musculoskeletal:        General: Normal range of motion.  Skin:    General: Skin is warm.     Capillary Refill:  Capillary refill takes less than 2 seconds.  Neurological:     General: No focal deficit present.     Mental Status: He is alert. Mental status is at baseline.  Psychiatric:        Mood and Affect: Mood normal.        Behavior: Behavior normal.        Thought Content: Thought content normal.        Judgment: Judgment normal.    Last hemoglobin A1c Lab Results  Component Value Date   HGBA1C 7.6 (A) 04/05/2022        Assessment & Plan:   Problem List Items Addressed This Visit       Cardiovascular and Mediastinum   Benign essential hypertension   Relevant Medications   atorvastatin (LIPITOR) 20 MG tablet   aspirin EC 81 MG tablet   losartan (COZAAR) 25 MG tablet     Endocrine   Type 2 diabetes mellitus without complication, without long-term current use of insulin (HCC)   Relevant Medications   atorvastatin (LIPITOR) 20 MG tablet   metFORMIN (GLUCOPHAGE) 500 MG tablet   aspirin EC 81 MG tablet   losartan (COZAAR) 25 MG tablet   Other Relevant Orders   POCT HgB A1C (Completed)   Ambulatory referral to Optometry   Comprehensive metabolic panel   Microalbumin / creatinine urine ratio     Musculoskeletal and Integument   Arthritis of left knee   Relevant Medications   gabapentin (NEURONTIN) 300 MG capsule   aspirin EC 81 MG tablet   traMADol (ULTRAM) 50 MG tablet   Other Relevant Orders   Ambulatory referral to Physical Therapy     Other   S/P total knee arthroplasty, left   Relevant  Medications   traMADol (ULTRAM) 50 MG tablet   Other Relevant Orders   Ambulatory referral to Physical Therapy   Class 3 severe obesity due to excess calories with serious comorbidity and body mass index (BMI) of 40.0 to 44.9 in adult [facility])   Relevant Medications   metFORMIN (GLUCOPHAGE) 500 MG tablet   Other Visit Diagnoses     Encounter to establish care with new doctor    -  Primary     Discussed with pt that based on previous A1c's, he's had diabetes for at least 9 years.  Discussed complications of untreated and uncontrolled diabetes. Advised on restarting the Metformin that he was stopped on by previous provider, for unknown reasons. Pt denies diarrhea with this medicine and kidney function labs have all been good. Will restart Metformin along with checking urine micro and CMP today. He is to continue Atorvastatin 20mg  daily and added Aspirin 81 mg to regimen. Discussed diet and lifestyle changes. Will defer healthy weight and wellness for now as he would like to try on his own with PT for knee. He says he lost weight when doing this previously. Have referred pt for diabetic eye exam. Foot exam done today.  Due to elevated blood pressures above goal and he is maxed out on Amlodipine, will add Losartan 25mg  to Amlodipine 10mg  daily.  Referred back for PT for knee mobility. Refilled Ultram x 1 for his knee pain. Discussed with patient that if he needs to continue this after this rx, will need to be referred to pain management. He voiced understanding is agreeable to plan. He reported neurontin use for tingling in his left hand from the IV he had during surgery for his knee in June 2023 along with some tingling of his left leg. Also explained to patient neuropathy symptoms that could go along with diabetes as well. Will monitor for now.    Return in about 3 months (around 07/05/2022) for Hypertension, Diabetes.   Leeanne Rio, MD

## 2022-04-05 NOTE — Telephone Encounter (Signed)
Refilled Amlodipine.

## 2022-04-06 ENCOUNTER — Other Ambulatory Visit: Payer: Self-pay

## 2022-04-06 ENCOUNTER — Encounter: Payer: Self-pay | Admitting: Family Medicine

## 2022-04-06 DIAGNOSIS — E119 Type 2 diabetes mellitus without complications: Secondary | ICD-10-CM

## 2022-04-06 LAB — COMPREHENSIVE METABOLIC PANEL
ALT: 29 IU/L (ref 0–44)
AST: 16 IU/L (ref 0–40)
Albumin/Globulin Ratio: 2 (ref 1.2–2.2)
Albumin: 4.5 g/dL (ref 3.9–4.9)
Alkaline Phosphatase: 127 IU/L — ABNORMAL HIGH (ref 44–121)
BUN/Creatinine Ratio: 12 (ref 10–24)
BUN: 11 mg/dL (ref 8–27)
Bilirubin Total: 0.3 mg/dL (ref 0.0–1.2)
CO2: 26 mmol/L (ref 20–29)
Calcium: 11.5 mg/dL — ABNORMAL HIGH (ref 8.6–10.2)
Chloride: 103 mmol/L (ref 96–106)
Creatinine, Ser: 0.91 mg/dL (ref 0.76–1.27)
Globulin, Total: 2.3 g/dL (ref 1.5–4.5)
Glucose: 177 mg/dL — ABNORMAL HIGH (ref 70–99)
Potassium: 4.7 mmol/L (ref 3.5–5.2)
Sodium: 143 mmol/L (ref 134–144)
Total Protein: 6.8 g/dL (ref 6.0–8.5)
eGFR: 96 mL/min/{1.73_m2} (ref >59)

## 2022-04-06 LAB — MICROALBUMIN / CREATININE URINE RATIO
Creatinine, Urine: 512.9 mg/dL
Microalb/Creat Ratio: 9 mg/g creat (ref 0–29)
Microalbumin, Urine: 43.8 ug/mL

## 2022-04-06 MED ORDER — ASPIRIN 81 MG PO TBEC: 81.0000 mg | DELAYED_RELEASE_TABLET | Freq: Every day | ORAL | 1 refills | Status: DC

## 2022-04-06 NOTE — Telephone Encounter (Signed)
Patient informed. 

## 2022-04-11 ENCOUNTER — Ambulatory Visit: Payer: 59 | Admitting: Physical Therapy

## 2022-04-11 ENCOUNTER — Telehealth: Payer: Self-pay | Admitting: Family Medicine

## 2022-04-11 NOTE — Telephone Encounter (Signed)
Patient called and stated [facility] requires a signature on Outpatient Rehab referral before it can be sent. Spoke with [facility]. [facility] faxed a request to [facility] that did not go through and provided them fax for [employer]. Fax received and placed in providers box for signature per [facility]. Buel Ream

## 2022-04-11 NOTE — Telephone Encounter (Signed)
Paperwork given to [employer] to fax

## 2022-04-11 NOTE — Telephone Encounter (Signed)
I never received any paperwork to sign until now.  Have signed this referral and placed in Kim's box for faxing back.

## 2022-04-12 NOTE — Telephone Encounter (Signed)
Paperwork faxed back and confirmation received 04/11/2022. Buel Ream

## 2022-04-14 ENCOUNTER — Ambulatory Visit: Payer: 59

## 2022-04-17 ENCOUNTER — Other Ambulatory Visit: Payer: Self-pay | Admitting: Surgical

## 2022-05-02 ENCOUNTER — Encounter: Payer: 59 | Admitting: Physical Therapy

## 2022-05-03 LAB — HM DIABETES EYE EXAM

## 2022-05-03 LAB — DIABETIC EYE EXAM

## 2022-05-04 ENCOUNTER — Telehealth: Payer: Self-pay | Admitting: Family Medicine

## 2022-05-04 NOTE — Telephone Encounter (Signed)
Completed and in my box for picking up

## 2022-05-04 NOTE — Telephone Encounter (Signed)
Patient came in to request a renewal on expired 6 month handicap placard. Patient stated we should have the forms, to which we did not but were able to print one from the [facility] website. Patient was provided form to fill out personal information. Patient was informed of 3-5 business day turn around and potential fees, to which patient stated 'that is not the process' based on experience with differing provider in [place]. Paperwork attached to provider billing form and placed in provider's box. Buel Ream

## 2022-05-08 ENCOUNTER — Other Ambulatory Visit: Payer: Self-pay | Admitting: Family Medicine

## 2022-05-08 ENCOUNTER — Telehealth: Payer: Self-pay | Admitting: Radiology

## 2022-05-08 NOTE — Telephone Encounter (Signed)
Completed forms received by front desk. Attempted to call patient regarding completion and pickup of forms, voicemail was not available. Patient called back and was informed that paperwork is completed and ready for pickup. Ernest Navarro

## 2022-05-08 NOTE — Telephone Encounter (Signed)
Patient calling states that  he would like to further discuss the [facility] therapy and would like to get a written rx for a at home hot tub for him to do his hydro therapy or if this is something that he would have to do at a hydro facility.  His call back number is [phone]

## 2022-05-09 ENCOUNTER — Encounter: Payer: 59 | Admitting: Physical Therapy

## 2022-05-09 NOTE — Telephone Encounter (Signed)
Hi.  I have never prescribed a hot tub for anyone in 24 years.  However on the attached statement I do not think I could at this point because it has already been purchased and the date has to be on her before the purchase date I typically do not backdate any prescriptions.

## 2022-05-09 NOTE — Telephone Encounter (Signed)
Patient has sent in My Chart message regarding information he received for tax deduction if he has prescription for hot tub.  This message has been sent to Dr. Marlou Sa and is awaiting response.

## 2022-05-09 NOTE — Telephone Encounter (Signed)
I can write a prescription for him to do hydrotherapy at a facility and insurance may or may not cover..  There is a high likelihood that insurance will not pay for him to do at home hot tub

## 2022-05-12 ENCOUNTER — Encounter: Payer: Self-pay | Admitting: Family Medicine

## 2022-05-15 ENCOUNTER — Other Ambulatory Visit: Payer: Self-pay | Admitting: Surgical

## 2022-05-18 ENCOUNTER — Ambulatory Visit: Payer: 59 | Admitting: Family Medicine

## 2022-05-18 ENCOUNTER — Encounter: Payer: Self-pay | Admitting: Family Medicine

## 2022-05-18 VITALS — BP 169/98 | HR 91 | Temp 97.8°F | Ht 72.0 in | Wt 327.9 lb

## 2022-05-18 DIAGNOSIS — I10 Essential (primary) hypertension: Secondary | ICD-10-CM | POA: Diagnosis not present

## 2022-05-18 DIAGNOSIS — M1712 Unilateral primary osteoarthritis, left knee: Secondary | ICD-10-CM | POA: Diagnosis not present

## 2022-05-18 NOTE — Progress Notes (Signed)
   Acute Office Visit  Subjective:     Patient ID: Ernest Malachi., male    DOB: Dec 18, 1960, 62 y.o.   MRN: [id number]  Chief Complaint  Patient presents with   Knee Pain    Pt would like to discuss doing Hydrotherapy at home for continued treatment for bilateral knee.    Knee Pain   Patient is in today for acute visit.  Pt reports he's had 8 sessions of PT for his knees. He wanted to have a conversation regarding hydrotherapy. He reports swelling in his legs more than his left knee. His orthopedic provider has released him. He did join the [facility]  He feels bloated at times. He's been doing a smoothies in the morning. He has cut back on pork, meat, and fatty food. He's been eating more salads lately.  Pt does have elevated blood pressure this morning, just took his bp medicines at 630am. He has a bp cuff at home but not checking his blood pressure.   Review of Systems  Musculoskeletal:  Positive for joint pain.  All other systems reviewed and are negative.       Objective:    BP (!) 169/98   Pulse 91   Temp 97.8 F (36.6 C) (Oral)   Ht 6' (1.829 m)   Wt (!) 327 lb 14.4 oz (148.7 kg)   SpO2 96%   BMI 44.47 kg/m    Physical Exam Vitals and nursing note reviewed.  Constitutional:      Appearance: Normal appearance. He is obese.  HENT:     Head: Normocephalic and atraumatic.     Right Ear: External ear normal.     Left Ear: External ear normal.  Cardiovascular:     Pulses: Normal pulses.  Pulmonary:     Effort: Pulmonary effort is normal.  Skin:    General: Skin is warm.     Capillary Refill: Capillary refill takes less than 2 seconds.  Neurological:     General: No focal deficit present.     Mental Status: He is alert and oriented to person, place, and time. Mental status is at baseline.  Psychiatric:        Mood and Affect: Mood normal.        Behavior: Behavior normal.        Thought Content: Thought content normal.        Judgment: Judgment normal.     No results found for any visits on 05/18/22.      Assessment & Plan:   Problem List Items Addressed This Visit       Cardiovascular and Mediastinum   Benign essential hypertension     Musculoskeletal and Integument   Arthritis of left knee - Primary    No orders of the defined types were placed in this encounter. To discuss hydrotherapy with orthopedic regarding home therapy? Not sure if this would be feasible. Offered pt doing aquatic therapy at a facility such as the [facility] To monitor blood pressures outside the office and bring log back to appt in 6 weeks.   No follow-ups on file.  Leeanne Rio, MD

## 2022-06-18 ENCOUNTER — Other Ambulatory Visit: Payer: Self-pay | Admitting: Surgical

## 2022-06-20 ENCOUNTER — Telehealth (INDEPENDENT_AMBULATORY_CARE_PROVIDER_SITE_OTHER): Payer: 59 | Admitting: Family Medicine

## 2022-06-20 ENCOUNTER — Encounter: Payer: Self-pay | Admitting: Family Medicine

## 2022-06-20 DIAGNOSIS — U071 COVID-19: Secondary | ICD-10-CM | POA: Diagnosis not present

## 2022-06-20 MED ORDER — NIRMATRELVIR/RITONAVIR (PAXLOVID)TABLET: 3.0000 | ORAL_TABLET | Freq: Two times a day (BID) | ORAL | 0 refills | Status: AC

## 2022-06-20 NOTE — Progress Notes (Signed)
Established Patient Virtual video Visit  Subjective   Patient ID: Ernest Navarro., male    DOB: [birth year]  Age: 62 y.o. MRN: [id number]  Chief Complaint  Patient presents with   Covid Positive   Virtual Visit via Video Note  I connected with Ernest Navarro. on 06/20/22 at  2:50 PM EDT by a video enabled telemedicine application and verified that I am speaking with the correct person using two identifiers.  Location: Patient: At home in [facility] Provider: Primary care at [facility]   I discussed the limitations of evaluation and management by telemedicine and the availability of in person appointments. The patient expressed understanding and agreed to proceed.  History of Present Illness:  Pt reports he had a family event over the weekend. They were outside during that time. On Sunday evening he felt weak and fatigue. He also had itchy scratchy throat and attributed it to allergies. He then woke up Monday feeling achy. He took a Covid test and was positive. He has had chills, sweats, muscle aches with cough productive of a lot of phlegm. He says he's had headaches that he's taken Tylenol for and haven't helped much.  T Max 100 yesterday. Denies chest pains or SOB. Pain and discomfort in his joints has worsened since being sick. Scale 7/10.   Review of Systems  Constitutional:  Positive for chills, fever and malaise/fatigue.  HENT:  Negative for congestion and sore throat.   Respiratory:  Positive for cough and sputum production. Negative for shortness of breath and wheezing.   Cardiovascular:  Negative for chest pain.  Musculoskeletal:  Positive for joint pain.  All other systems reviewed and are negative.    Objective:     There were no vitals taken for this visit.   Physical Exam Nursing note reviewed.  Constitutional:      Appearance: Normal appearance. He is obese. He is ill-appearing.  HENT:     Head: Normocephalic and atraumatic.      Right Ear: External ear normal.     Left Ear: External ear normal.     Nose: Nose normal.  Eyes:     Extraocular Movements: Extraocular movements intact.  Pulmonary:     Effort: Pulmonary effort is normal. No respiratory distress.     Breath sounds: No wheezing.  Neurological:     Mental Status: He is alert.  Psychiatric:        Mood and Affect: Mood normal.        Behavior: Behavior normal.        Thought Content: Thought content normal.        Judgment: Judgment normal.    No results found for any visits on 06/20/22.    The ASCVD Risk score (Arnett DK, et al., 2019) failed to calculate for the following reasons:   Cannot find a previous HDL lab    Assessment & Plan:   Problem List Items Addressed This Visit   None Visit Diagnoses     COVID-19    -  Primary   Relevant Medications   nirmatrelvir/ritonavir (PAXLOVID) 20 x 150 MG & 10 x 100MG  TABS      Due to covid + and worsening symptoms, clinically pt is ill appearing. Will go ahead and treat with Paxlovid. He is to stop Amlodipine, Aspirin, and Atorvastatin while taking this medicine.   I discussed the assessment and treatment plan with the patient. The patient was provided an opportunity to ask  questions and all were answered. The patient agreed with the plan and demonstrated an understanding of the instructions.   The patient was advised to call back or seek an in-person evaluation if the symptoms worsen or if the condition fails to improve as anticipated.   Total time spent with patient today 22 minutes. This includes reviewing records, evaluating the patient and coordinating care. Face-to-face time >50%.   Leeanne Rio, MD  No follow-ups on file.    Leeanne Rio, MD

## 2022-06-28 ENCOUNTER — Other Ambulatory Visit: Payer: Self-pay | Admitting: Surgical

## 2022-06-28 ENCOUNTER — Telehealth: Payer: Self-pay | Admitting: Orthopedic Surgery

## 2022-06-28 DIAGNOSIS — M1712 Unilateral primary osteoarthritis, left knee: Secondary | ICD-10-CM

## 2022-06-28 DIAGNOSIS — Z96652 Presence of left artificial knee joint: Secondary | ICD-10-CM

## 2022-06-28 MED ORDER — TRAMADOL HCL 50 MG PO TABS: 50.0000 mg | ORAL_TABLET | Freq: Every day | ORAL | 0 refills | Status: DC | PRN

## 2022-06-28 NOTE — Telephone Encounter (Signed)
FYI Pt was called and advised. He stated it started after he had covid. His pcp took him off his celebrex. I advised him to reach back out to them to see if he can resume that as well. He stated he would.

## 2022-06-28 NOTE — Telephone Encounter (Signed)
Sent in refill of tramadol, can't do anything stronger. This is only temporary

## 2022-06-28 NOTE — Telephone Encounter (Signed)
Pt called pain medication. Pt states his knee is bothering him for the last 3 days down to the nerve. Pt states his appt is 4/8 and can't take being in pain that long. Please call pt about this matter at [phone].

## 2022-07-10 ENCOUNTER — Ambulatory Visit: Payer: 59 | Admitting: Family Medicine

## 2022-07-10 ENCOUNTER — Ambulatory Visit: Payer: 59 | Admitting: Orthopedic Surgery

## 2022-08-04 ENCOUNTER — Ambulatory Visit: Payer: 59 | Admitting: Family Medicine

## 2022-08-22 ENCOUNTER — Ambulatory Visit (INDEPENDENT_AMBULATORY_CARE_PROVIDER_SITE_OTHER): Payer: 59 | Admitting: Family Medicine

## 2022-08-22 ENCOUNTER — Encounter: Payer: Self-pay | Admitting: Family Medicine

## 2022-08-22 VITALS — BP 160/88 | HR 101 | Temp 98.0°F | Resp 18 | Ht 72.0 in | Wt 316.8 lb

## 2022-08-22 DIAGNOSIS — E119 Type 2 diabetes mellitus without complications: Secondary | ICD-10-CM

## 2022-08-22 DIAGNOSIS — I10 Essential (primary) hypertension: Secondary | ICD-10-CM | POA: Diagnosis not present

## 2022-08-22 MED ORDER — GLIPIZIDE 10 MG PO TABS
10.0000 mg | ORAL_TABLET | Freq: Every day | ORAL | 0 refills | Status: DC
Start: 2022-08-22 — End: 2022-09-15

## 2022-08-22 MED ORDER — LOSARTAN POTASSIUM 50 MG PO TABS: 50.0000 mg | ORAL_TABLET | Freq: Every day | ORAL | 1 refills | Status: DC

## 2022-08-22 NOTE — Progress Notes (Signed)
Established Patient Office Visit  Subjective   Patient ID: Ernest Navarro., male    DOB: 23-Jan-1961  Age: 62 y.o. MRN: [id number]  Chief Complaint  Patient presents with   Follow-up    Patient is here for a follow up appointment , patient would like to start ozempic    HPI  Hypertension Pt is taking Amlodipine 10mg  and Losartan 25mg  together, the same time. He takes it about 2pm. Not checking blood pressures at home.   Diabetes Pt is taking Metformin 500mg  BID. He reports it's making him nauseated. He stopped it for the last 3 weeks and he no longer has these symptoms. He would like to start Ozempic. Pt was seen in January 2024 and had labs done. Noted to have elevated calcium levels.    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    Objective:     BP (!) 165/111   Pulse (!) 101   Temp 98 F (36.7 C) (Oral)   Resp 18   Ht 6' (1.829 m)   Wt (!) 316 lb 12.8 oz (143.7 kg)   SpO2 96%   BMI 42.97 kg/m    Physical Exam Vitals and nursing note reviewed.  Constitutional:      Appearance: Normal appearance. He is obese.  HENT:     Head: Normocephalic and atraumatic.     Right Ear: External ear normal.     Left Ear: External ear normal.     Nose: Nose normal.     Mouth/Throat:     Mouth: Mucous membranes are moist.   Eyes:     Pupils: Pupils are equal, round, and reactive to light.  Cardiovascular:     Rate and Rhythm: Regular rhythm. Tachycardia present.     Pulses: Normal pulses.     Heart sounds: Normal heart sounds.  Pulmonary:     Effort: Pulmonary effort is normal.     Breath sounds: Normal breath sounds.  Skin:    Capillary Refill: Capillary refill takes less than 2 seconds.  Neurological:     General: No focal deficit present.     Mental Status: He is alert and oriented to person, place, and time. Mental status is at baseline.  Psychiatric:        Mood and Affect: Mood normal.        Behavior: Behavior normal.        Thought Content: Thought content normal.        Judgment: Judgment normal.    No results found for any visits on 08/22/22.    The ASCVD Risk score (Arnett DK, et al., 2019) failed to calculate for the following reasons:   Cannot find a previous HDL lab   Cannot find a previous total cholesterol lab    Assessment & Plan:   Problem List Items  Addressed This Visit   None  Benign essential hypertension -     Losartan Potassium; Take 1 tablet (50 mg total) by mouth daily.  Dispense: 90 tablet; Refill: 1 -     PTH, intact and calcium -     Basic metabolic panel  Type 2 diabetes mellitus without complication, without long-term current use of insulin (HCC) -     PTH, intact and calcium -     Basic metabolic panel -     Hemoglobin A1c -     glipiZIDE; Take 1 tablet (10 mg total) by mouth daily before breakfast.  Dispense: 30 tablet; Refill: 0  Hypercalcemia -     PTH, intact and calcium -     Basic metabolic panel   Blood pressure not at goal. Increase Losartan from 25mg  to 50mg  daily. Will check A1c but for now, add Glipizide 10mg  daily. Pt hasn't been taking any diabetic medicines in the last 3 weeks. Pending results, may add weekly injection for diabetes and weight control. Due to elevated calcium levels, screen PTH today.  No follow-ups on file.     Suzan Slick, MD

## 2022-08-23 ENCOUNTER — Other Ambulatory Visit: Payer: Self-pay | Admitting: Family Medicine

## 2022-08-23 ENCOUNTER — Encounter: Payer: Self-pay | Admitting: Family Medicine

## 2022-08-23 DIAGNOSIS — E119 Type 2 diabetes mellitus without complications: Secondary | ICD-10-CM

## 2022-08-23 LAB — BASIC METABOLIC PANEL
BUN/Creatinine Ratio: 9 — ABNORMAL LOW (ref 10–24)
BUN: 8 mg/dL (ref 8–27)
CO2: 25 mmol/L (ref 20–29)
Calcium: 11.1 mg/dL — ABNORMAL HIGH (ref 8.6–10.2)
Chloride: 103 mmol/L (ref 96–106)
Creatinine, Ser: 0.94 mg/dL (ref 0.76–1.27)
Glucose: 106 mg/dL — ABNORMAL HIGH (ref 70–99)
Potassium: 4.3 mmol/L (ref 3.5–5.2)
Sodium: 143 mmol/L (ref 134–144)
eGFR: 92 mL/min/{1.73_m2} (ref >59)

## 2022-08-23 LAB — HEMOGLOBIN A1C
Est. average glucose Bld gHb Est-mCnc: 194 mg/dL
Hgb A1c MFr Bld: 8.4 % — ABNORMAL HIGH (ref 4.8–5.6)

## 2022-08-23 LAB — PTH, INTACT AND CALCIUM: PTH: 104 pg/mL — ABNORMAL HIGH (ref 15–65)

## 2022-08-23 MED ORDER — SEMAGLUTIDE(0.25 OR 0.5MG/DOS) 2 MG/3ML ~~LOC~~ SOPN
0.2500 mg | PEN_INJECTOR | SUBCUTANEOUS | 1 refills | Status: DC
Start: 2022-08-23 — End: 2022-10-24

## 2022-08-31 ENCOUNTER — Telehealth: Payer: Self-pay | Admitting: Family Medicine

## 2022-08-31 DIAGNOSIS — Z96652 Presence of left artificial knee joint: Secondary | ICD-10-CM

## 2022-08-31 DIAGNOSIS — M1712 Unilateral primary osteoarthritis, left knee: Secondary | ICD-10-CM

## 2022-08-31 MED ORDER — TRAMADOL HCL 50 MG PO TABS
50.0000 mg | ORAL_TABLET | Freq: Two times a day (BID) | ORAL | 0 refills | Status: AC | PRN
Start: 2022-08-31 — End: 2022-09-07

## 2022-08-31 NOTE — Telephone Encounter (Signed)
I've refilled this one time only for a week. We will discuss pain management referral next visit with me.

## 2022-08-31 NOTE — Telephone Encounter (Signed)
Patient calling in to request a prescription of tramaDOL for hip, back, and lower leg pain until next scheduled visit of 06/04/20204 at 4 PM. Please advise. Katha Hamming

## 2022-08-31 NOTE — Addendum Note (Signed)
Addended by: Suzan Slick on: 08/31/2022 02:29 PM   Modules accepted: Orders

## 2022-08-31 NOTE — Telephone Encounter (Signed)
Spoke with patient regarding refill of Ultram and he states that he no longer see's Ortho doctor who prescribed medication last, and he was wanting you as his PCP to take over the prescription for the Ultram, he said ,since he will be coming to see you 09/05/22 could you prescribe the Ultram for him because he is in a lot of pain , and last he would like a referral to a specialist for his lower back pain as well.  Please Advise

## 2022-08-31 NOTE — Telephone Encounter (Signed)
Please inform pt that since he's routinely gotten Ultram filled by Ortho. I've filled only once. Ortho has filled last so will need to continue getting refill from their office. Can't have multiple providers prescribing the same controlled medicine.

## 2022-08-31 NOTE — Telephone Encounter (Signed)
Spoke with patient and informed him about message from Dr. Wyline Mood , Patient is aware of refill for only one week until appointment and aware of discussing pain management.

## 2022-09-05 ENCOUNTER — Ambulatory Visit (INDEPENDENT_AMBULATORY_CARE_PROVIDER_SITE_OTHER): Payer: 59 | Admitting: Family Medicine

## 2022-09-05 ENCOUNTER — Encounter: Payer: Self-pay | Admitting: Family Medicine

## 2022-09-05 VITALS — BP 144/89 | HR 103 | Temp 97.9°F | Resp 20 | Ht 72.0 in | Wt 317.0 lb

## 2022-09-05 DIAGNOSIS — M5432 Sciatica, left side: Secondary | ICD-10-CM

## 2022-09-05 DIAGNOSIS — R7989 Other specified abnormal findings of blood chemistry: Secondary | ICD-10-CM

## 2022-09-05 DIAGNOSIS — M79605 Pain in left leg: Secondary | ICD-10-CM

## 2022-09-05 DIAGNOSIS — M545 Low back pain, unspecified: Secondary | ICD-10-CM

## 2022-09-05 MED ORDER — GABAPENTIN 300 MG PO CAPS
300.0000 mg | ORAL_CAPSULE | Freq: Two times a day (BID) | ORAL | 0 refills | Status: DC
Start: 2022-09-05 — End: 2022-10-23

## 2022-09-05 NOTE — Progress Notes (Signed)
Established Patient Office Visit  Subjective   Patient ID: Ernest Maylor., male    DOB: 1960-12-11  Age: 62 y.o. MRN: [id number]  Chief Complaint  Patient presents with   Follow-up    Patient is here to follow up for BP, calcium, and Glucose, he would also like a referral for MRI for his back and the pain in his left lower back and left leg    HPI  Hypertension Pt is on Amlodipine 10mg  daily and was increased on his Losartan 25 mg to 50 mg. Blood pressure better today. Denies chest pains or SOB.  Back pain Pt reports he's had lower back pain with radiation down left leg. He seen Ortho and he was told to get the knee replaced on the left side. He says the pain shoots down feels like stabbing pain. He uses the Ultram for this pain but was initially placed on this for his knee[place] and now with pain. Pt is also diabetic and was started on Ozempic to help with diabetes and weight control.  Hypercalcemia  Pt also had physical and was noted to have several hypercalcemia results. This was repeated last month along with elevated PTH. He also has diffuse joint pains including knees with knee replacement. Now he is having back with left leg sciatica pain.   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   S/P total knee arthroplasty, right 10/28/2020   Type 2 diabetes mellitus without complication, without long-term current use of insulin (HCC) 05/15/2019   Hypotestosteronism 05/13/2019   Class 3 severe obesity due to excess calories with serious comorbidity and body mass index (BMI) of 40.0 to 44.9 in adult [facility]) 11/13/2018   Other male erectile dysfunction 05/25/2017   Lichen planus 05/27/2015   Obstructive sleep apnea syndrome 05/06/2015   Gout 06/19/2012   Benign essential hypertension 06/19/2012     Review of Systems  Musculoskeletal:  Positive for back pain  and joint pain.       Left lower back pain with radicular pain and tingling  Neurological:  Positive for tingling.       Left leg pain  All other systems reviewed and are negative.    Objective:     BP (!) 144/89   Pulse (!) 103   Temp 97.9 F (36.6 C) (Oral)   Resp 20   Ht 6' (1.829 m)   Wt (!) 317 lb (143.8 kg)   SpO2 96%   BMI 42.99 kg/m    Physical Exam Vitals and nursing note reviewed.  Constitutional:      Appearance: Normal appearance. He is obese.  HENT:     Head: Normocephalic and atraumatic.     Right Ear: External ear normal.     Left Ear: External ear normal.     Nose: Nose normal.     Mouth/Throat:     Mouth: Mucous membranes are moist.  Eyes:     Conjunctiva/sclera: Conjunctivae normal.  Cardiovascular:     Rate and Rhythm: Regular rhythm. Tachycardia present.     Pulses: Normal pulses.     Heart sounds: Normal heart sounds.  Pulmonary:     Effort: Pulmonary effort is normal.     Breath sounds: Normal breath sounds.  Musculoskeletal:        General: Tenderness present.  Comments: Positive SLR on left with TTP to left SI joint  Skin:    Capillary Refill: Capillary refill takes less than 2 seconds.  Neurological:     General: No focal deficit present.     Mental Status: He is alert and oriented to person, place, and time. Mental status is at baseline.  Psychiatric:        Mood and Affect: Mood normal.        Behavior: Behavior normal.        Thought Content: Thought content normal.        Judgment: Judgment normal.    No results found for any visits on 09/05/22.    The ASCVD Risk score (Arnett DK, et al., 2019) failed to calculate for the following reasons:   Cannot find a previous HDL lab   Cannot find a previous total cholesterol lab    Assessment & Plan:   Problem List Items Addressed This Visit   None  Lumbar pain with radiation down left leg -     Gabapentin; Take 1 capsule (300 mg total) by mouth 2 (two) times daily.   Dispense: 90 capsule; Refill: 0 -     DG Lumbar Spine Complete  Sciatica of left side -     Gabapentin; Take 1 capsule (300 mg total) by mouth 2 (two) times daily.  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 Send for xrays. Pending results, refer for PT. For now, has ultram to use prn and will increase Gabapentin to 300mg  BID from daily. Follow up pending results. Refer to Endocrinology for evaluation of hypercalcemia and elevated PTH.  No follow-ups on file.    Suzan Slick, MD

## 2022-09-06 ENCOUNTER — Telehealth: Payer: Self-pay

## 2022-09-06 NOTE — Telephone Encounter (Signed)
Spoke with patient and informed him of A1C results and also instructions for the Ozempic, Patient states that he has already started the Ozempic and that he has one week left on 0.25 , he wants to know did you mean for him to start the .05 and follow up in four weeks afterwards?

## 2022-09-14 ENCOUNTER — Other Ambulatory Visit: Payer: Self-pay | Admitting: Family Medicine

## 2022-09-14 DIAGNOSIS — E119 Type 2 diabetes mellitus without complications: Secondary | ICD-10-CM

## 2022-09-19 ENCOUNTER — Other Ambulatory Visit: Payer: Self-pay | Admitting: Family Medicine

## 2022-09-19 ENCOUNTER — Other Ambulatory Visit: Payer: Self-pay | Admitting: Surgical

## 2022-09-19 DIAGNOSIS — M5432 Sciatica, left side: Secondary | ICD-10-CM

## 2022-09-19 DIAGNOSIS — M79605 Pain in left leg: Secondary | ICD-10-CM

## 2022-09-19 DIAGNOSIS — I10 Essential (primary) hypertension: Secondary | ICD-10-CM

## 2022-09-20 ENCOUNTER — Ambulatory Visit (INDEPENDENT_AMBULATORY_CARE_PROVIDER_SITE_OTHER): Payer: 59

## 2022-09-20 ENCOUNTER — Telehealth: Payer: Self-pay | Admitting: Family Medicine

## 2022-09-20 DIAGNOSIS — M545 Low back pain, unspecified: Secondary | ICD-10-CM

## 2022-09-20 DIAGNOSIS — M79605 Pain in left leg: Secondary | ICD-10-CM

## 2022-09-20 DIAGNOSIS — M5432 Sciatica, left side: Secondary | ICD-10-CM | POA: Diagnosis not present

## 2022-09-20 NOTE — Telephone Encounter (Signed)
Patient calling to inquire about status of endocrinology referral. States it was placed at his last visit but he has not heard back. Patient requesting update if possible at number on file. Please advise. Katha Hamming

## 2022-09-21 ENCOUNTER — Other Ambulatory Visit: Payer: Self-pay

## 2022-09-26 ENCOUNTER — Other Ambulatory Visit: Payer: Self-pay | Admitting: Family Medicine

## 2022-09-26 DIAGNOSIS — E119 Type 2 diabetes mellitus without complications: Secondary | ICD-10-CM

## 2022-10-10 ENCOUNTER — Other Ambulatory Visit: Payer: Self-pay | Admitting: Surgical

## 2022-10-16 ENCOUNTER — Encounter: Payer: Self-pay | Admitting: Family Medicine

## 2022-10-16 ENCOUNTER — Telehealth: Payer: Self-pay | Admitting: Family Medicine

## 2022-10-16 NOTE — Telephone Encounter (Signed)
Patient calling in requesting results of imaging finalized on 06/24. Patient also requesting updated handicap placard and an updated letter for his work accommodations. Patient states he is still in pain. Patient would like to speak with doctor or have her call him. Patient also has forms that need to be filled out for company.

## 2022-10-16 NOTE — Telephone Encounter (Signed)
Patient was transferred , and was wanting to be reminded of Xray results, Patient was requesting medicine for back pain advised patient that he would have to speak with provider, provider will return next week. Patient wanted to schedule an appointment for next week with provider, transferred patient to schedule appointment

## 2022-10-21 ENCOUNTER — Other Ambulatory Visit: Payer: Self-pay | Admitting: Family Medicine

## 2022-10-21 ENCOUNTER — Other Ambulatory Visit: Payer: Self-pay | Admitting: Surgical

## 2022-10-21 DIAGNOSIS — E119 Type 2 diabetes mellitus without complications: Secondary | ICD-10-CM

## 2022-10-23 ENCOUNTER — Ambulatory Visit: Payer: 59 | Admitting: Family Medicine

## 2022-10-23 ENCOUNTER — Ambulatory Visit (INDEPENDENT_AMBULATORY_CARE_PROVIDER_SITE_OTHER): Payer: 59

## 2022-10-23 ENCOUNTER — Encounter: Payer: Self-pay | Admitting: Family Medicine

## 2022-10-23 ENCOUNTER — Ambulatory Visit (INDEPENDENT_AMBULATORY_CARE_PROVIDER_SITE_OTHER): Payer: 59 | Admitting: Student

## 2022-10-23 ENCOUNTER — Other Ambulatory Visit (HOSPITAL_BASED_OUTPATIENT_CLINIC_OR_DEPARTMENT_OTHER): Payer: Self-pay | Admitting: Student

## 2022-10-23 ENCOUNTER — Other Ambulatory Visit (HOSPITAL_BASED_OUTPATIENT_CLINIC_OR_DEPARTMENT_OTHER): Payer: Self-pay

## 2022-10-23 VITALS — BP 150/104 | HR 110 | Temp 98.3°F | Resp 18 | Ht 72.0 in | Wt 312.5 lb

## 2022-10-23 DIAGNOSIS — Z96652 Presence of left artificial knee joint: Secondary | ICD-10-CM

## 2022-10-23 DIAGNOSIS — M25562 Pain in left knee: Secondary | ICD-10-CM

## 2022-10-23 DIAGNOSIS — M545 Low back pain, unspecified: Secondary | ICD-10-CM

## 2022-10-23 DIAGNOSIS — G8929 Other chronic pain: Secondary | ICD-10-CM

## 2022-10-23 DIAGNOSIS — M79605 Pain in left leg: Secondary | ICD-10-CM

## 2022-10-23 DIAGNOSIS — M5432 Sciatica, left side: Secondary | ICD-10-CM

## 2022-10-23 MED ORDER — MELOXICAM 15 MG PO TABS
15.0000 mg | ORAL_TABLET | Freq: Every day | ORAL | 0 refills | Status: AC
  Filled 2022-10-23: qty 14, 14d supply, fill #0

## 2022-10-23 MED ORDER — GABAPENTIN 300 MG PO CAPS: 300.0000 mg | ORAL_CAPSULE | Freq: Two times a day (BID) | ORAL | 1 refills | Status: DC

## 2022-10-23 NOTE — Progress Notes (Signed)
Established Patient Office Visit  Subjective   Patient ID: Ernest Navarro., male    DOB: 1961-02-23  Age: 62 y.o. MRN: [id number]  Chief Complaint  Patient presents with   Results    Patient is here to discuss Xray Results and he has paperwork for healthcare provider medical accomodation, as well as Handicap placard paperwork, He would like a referral to pain management, and physical therapy in [place], Patient also states that he would like to get a sooner visit to endocrinology the earliest appt November.    HPI  Sciatica Pt reports left leg pain, starts in the left lower back. He was started on Gabapentin 300 mg BID but he wasn't taking it consistently in the last week. He would like to start PT and see pain clinic for further treatment options. He does report he needs refills of the Gabapentin.  Pt would like handicap placard extended.  He reports he's been revoked from work since 2021 for the knee. He reports he's been working from home since 2021. He was working from home remotely during the knee replacements. He is unable to walk due to left leg pain from sciatica and still having left knee swelling.  He asks about Endocrinology referral and wondered if this could be done sooner. His appt isn't until November.   Review of Systems  Musculoskeletal:  Positive for back pain and joint pain.     Objective:     BP (!) 150/104   Pulse (!) 110   Temp 98.3 F (36.8 C) (Oral)   Resp 18   Ht 6' (1.829 m)   Wt (!) 312 lb 8 oz (141.7 kg)   SpO2 97%   BMI 42.38 kg/m    Physical Exam Vitals and nursing note reviewed.  Constitutional:      Appearance: Normal appearance. He is obese.  HENT:     Right Ear: External ear normal.     Left Ear: External ear normal.     Nose: Nose normal.  Cardiovascular:     Rate and Rhythm: Tachycardia present.  Pulmonary:     Effort: Pulmonary effort is normal.  Neurological:     General: No focal deficit present.     Mental Status:  He is alert. Mental status is at baseline.  Psychiatric:        Mood and Affect: Mood normal.        Behavior: Behavior normal.        Thought Content: Thought content normal.        Judgment: Judgment normal.    No results found for any visits on 10/23/22.    The ASCVD Risk score (Arnett DK, et al., 2019) failed to calculate for the following reasons:   Cannot find a previous HDL lab   Cannot find a previous total cholesterol lab    Assessment & Plan:   Problem List Items Addressed This Visit   None Lumbar pain with radiation down left leg -     Ambulatory referral to Physical Therapy -     Ambulatory referral to Pain Clinic -     Gabapentin; Take 1 capsule (300 mg total) by mouth 2 (two) times daily.  Dispense: 180 capsule; Refill: 1  Sciatica of left side -     Ambulatory referral to Physical Therapy -     Ambulatory referral to Pain Clinic -     Gabapentin; Take 1 capsule (300 mg total) by mouth 2 (two) times daily.  Dispense:  180 capsule; Refill: 1   To refer to PT and pain management for low back pain and left sciatica.  To take gabapentin 300 mg BID for now and follow up in 4 weeks.  To complete DMV form and accommodation form for sciatica due to limited mobility.  No follow-ups on file.    Suzan Slick, MD

## 2022-10-25 ENCOUNTER — Other Ambulatory Visit (HOSPITAL_COMMUNITY): Payer: Self-pay | Admitting: Nurse Practitioner

## 2022-10-25 NOTE — Progress Notes (Signed)
Chief Complaint: Left knee pain     History of Present Illness:    Ernest Navarro. is a 62 y.o. male is presenting today with left knee pain and swelling.  He did have a left total knee arthroplasty on 09/05/2021 with Dr. August Saucer.  He states that he has never quite achieved his desired level of mobility or been completely pain-free.  The swelling of his left knee has also never completely subsided and the skin changes today.  He describes that he has pain radiating down the left lower leg as well as up the thigh toward the hip.  States that he has difficulty with standing for periods of time, walking distances, and general mobility.  He has been taking gabapentin which she has not noticed a significant difference from.  Denies fever or chills.   Surgical History:   Left TKA-09/05/2021  PMH/PSH/Family History/Social History/Meds/Allergies:    Past Medical History:  Diagnosis Date   Arthritis    bilateral knees   Class 3 severe obesity due to excess calories with serious comorbidity and body mass index (BMI) of 40.0 to 44.9 in adult [facility]) 11/13/2018   Essential hypertension, benign 06/19/2012   Obstructive sleep apnea syndrome 05/06/2015   Formatting of this note might be different from the original. Overview: High probability diagnosis based on previous reported sleep study and weight gain with witnessed apneas and loud snoring. We discussed the basic physiology and medical concerns. Plan-schedule sleep study, emphasize driving responsibility and advise weight loss Last Assessment & Plan: Formatting of this note might be different    Other male erectile dysfunction 05/25/2017   Sleep apnea    Type 2 diabetes mellitus without complication, without long-term current use of insulin (HCC) 05/15/2019   Past Surgical History:  Procedure Laterality Date   KNEE SURGERY Right    x3 arthroscopies   TOTAL KNEE ARTHROPLASTY Right 10/28/2020   Procedure: RIGHT TOTAL  KNEE ARTHROPLASTY;  Surgeon: Cammy Copa, MD;  Location: [facility] OR;  Service: Orthopedics;  Laterality: Right;   TOTAL KNEE ARTHROPLASTY Left 09/05/2021   Procedure: LEFT TOTAL KNEE ARTHROPLASTY;  Surgeon: Cammy Copa, MD;  Location: [facility] OR;  Service: Orthopedics;  Laterality: Left;   Social History   Socioeconomic History   Marital status: Single    Spouse name: Not on file   Number of children: Not on file   Years of education: Not on file   Highest education level: Not on file  Occupational History   Not on file  Tobacco Use   Smoking status: Never   Smokeless tobacco: Never  Vaping Use   Vaping status: Never Used  Substance and Sexual Activity   Alcohol use: No    Alcohol/week: 0.0 standard drinks of alcohol   Drug use: No   Sexual activity: Not on file  Other Topics Concern   Not on file  Social History Narrative   Not on file   Social Determinants of Health   Financial Resource Strain: Not on file  Food Insecurity: Not on file  Transportation Needs: Not on file  Physical Activity: Not on file  Stress: Not on file  Social Connections: Unknown (08/13/2021)   Received from [facility], [facility]   Social Network    Social Network: Not on file   Family History  Problem Relation Age of Onset   Lymphoma Brother    Diabetes Maternal Grandmother    Colon cancer Neg Hx    Pancreatic cancer Neg Hx    Stomach cancer Neg Hx    Esophageal cancer Neg Hx    Liver disease Neg Hx    Allergies  Allergen Reactions   Metformin And Related Nausea Only   Oxycodone Nausea Only    Makes him "delirious"   Current Outpatient Medications  Medication Sig Dispense Refill   meloxicam (MOBIC) 15 MG tablet Take 1 tablet (15 mg total) by mouth daily for 14 days. 14 tablet 0   acetaminophen (TYLENOL) 500 MG tablet Take 1,000 mg by mouth every 6 (six) hours as needed for moderate pain.     amLODipine (NORVASC) 10 MG tablet TAKE 1 TABLET BY MOUTH EVERY DAY 90 tablet 1    aspirin EC 81 MG tablet Take 1 tablet (81 mg total) by mouth daily. Swallow whole. 90 tablet 1   atorvastatin (LIPITOR) 20 MG tablet Take 20 mg by mouth daily.     celecoxib (CELEBREX) 100 MG capsule TAKE 1 CAPSULE BY MOUTH TWICE A DAY 60 capsule 0   cyclobenzaprine (FLEXERIL) 10 MG tablet TAKE 1 TABLET BY MOUTH AT BEDTIME. DO NOT OPERATE A MOTOR VEHICLE AFTER TAKING THIS MEDICATION 30 tablet 0   gabapentin (NEURONTIN) 300 MG capsule Take 1 capsule (300 mg total) by mouth 2 (two) times daily. 180 capsule 1   glipiZIDE (GLUCOTROL) 10 MG tablet TAKE 1 TABLET BY MOUTH DAILY BEFORE BREAKFAST. 90 tablet 1   losartan (COZAAR) 50 MG tablet Take 1 tablet (50 mg total) by mouth daily. 90 tablet 1   Semaglutide,0.25 or 0.5MG /DOS, (OZEMPIC, 0.25 OR 0.5 MG/DOSE,) 2 MG/3ML SOPN INJECT 0.25MG  INTO THE SKIN ONE TIME PER WEEK 2 mL 1   No current facility-administered medications for this visit.   No results found.  Review of Systems:   A ROS was performed including pertinent positives and negatives as documented in the HPI.  Physical Exam :   Constitutional: NAD and appears stated age Neurological: Alert and oriented Psych: Appropriate affect and cooperative There were no vitals taken for this visit.   Comprehensive Musculoskeletal Exam:    Left knee appears swollen with a mild effusion present.  No significant erythema or warmth.  Active range of motion from 0 to 100 degrees.  No instability with varus or valgus stress.  No calf tenderness.  Knee flexion and extension strength is 5/5.  Imaging:   Xray (left knee 3 views): Normal-appearing total knee arthroplasty components.  No changes from previous x-ray on 09/19/21    I personally reviewed and interpreted the radiographs.   Assessment:   62 y.o. male over 1 year status post left total knee arthroplasty.  He reports continued pain as well as mobility deficits.  He is experiencing some radiating pain that seems consistent with nerve pain.  Has  not noticed relief with gabapentin.  I would like to go ahead and start him on meloxicam for pain and inflammation control and would like him to follow-up with Dr. August Saucer for further evaluation.  He has an appointment scheduled in about 2 weeks.  I will extend his note for work today regarding his condition.  Plan :    -Begin meloxicam and follow-up with Dr. August Saucer as scheduled on 11/08/2022     I personally saw and evaluated the patient, and participated in the management and treatment plan.  Hazle Nordmann, PA-C  Orthopedics  This document was dictated using Conservation officer, historic buildings. A reasonable attempt at proof reading has been made to minimize errors.

## 2022-10-26 ENCOUNTER — Ambulatory Visit: Payer: 59 | Attending: Family Medicine | Admitting: Physical Therapy

## 2022-10-26 ENCOUNTER — Other Ambulatory Visit: Payer: Self-pay

## 2022-10-26 DIAGNOSIS — M5432 Sciatica, left side: Secondary | ICD-10-CM | POA: Diagnosis present

## 2022-10-26 DIAGNOSIS — M545 Low back pain, unspecified: Secondary | ICD-10-CM | POA: Diagnosis present

## 2022-10-26 DIAGNOSIS — R262 Difficulty in walking, not elsewhere classified: Secondary | ICD-10-CM | POA: Diagnosis present

## 2022-10-26 DIAGNOSIS — M6281 Muscle weakness (generalized): Secondary | ICD-10-CM | POA: Diagnosis present

## 2022-10-26 DIAGNOSIS — M79605 Pain in left leg: Secondary | ICD-10-CM | POA: Insufficient documentation

## 2022-10-26 NOTE — Therapy (Signed)
OUTPATIENT PHYSICAL THERAPY THORACOLUMBAR EVALUATION   Patient Name: Ernest Navarro. MRN: [id number] DOB:[birth year], 62 y.o., male Today's Date: 10/26/2022  END OF SESSION:  PT End of Session - 10/26/22 1605     Visit Number 1    Number of Visits 16    Date for PT Re-Evaluation 12/21/22    Authorization Type [employer]    PT Start Time 1610    PT Stop Time 1655    PT Time Calculation (min) 45 min    Activity Tolerance Patient tolerated treatment well             Past Medical History:  Diagnosis Date   Arthritis    bilateral knees   Class 3 severe obesity due to excess calories with serious comorbidity and body mass index (BMI) of 40.0 to 44.9 in adult (HCC) 11/13/2018   Essential hypertension, benign 06/19/2012   Obstructive sleep apnea syndrome 05/06/2015   Formatting of this note might be different from the original. Overview: High probability diagnosis based on previous reported sleep study and weight gain with witnessed apneas and loud snoring. We discussed the basic physiology and medical concerns. Plan-schedule sleep study, emphasize driving responsibility and advise weight loss Last Assessment & Plan: Formatting of this note might be different    Other male erectile dysfunction 05/25/2017   Sleep apnea    Type 2 diabetes mellitus without complication, without long-term current use of insulin (HCC) 05/15/2019   Past Surgical History:  Procedure Laterality Date   KNEE SURGERY Right    x3 arthroscopies   TOTAL KNEE ARTHROPLASTY Right 10/28/2020   Procedure: RIGHT TOTAL KNEE ARTHROPLASTY;  Surgeon: Cammy Copa, MD;  Location: [facility] OR;  Service: Orthopedics;  Laterality: Right;   TOTAL KNEE ARTHROPLASTY Left 09/05/2021   Procedure: LEFT TOTAL KNEE ARTHROPLASTY;  Surgeon: Cammy Copa, MD;  Location: [facility] OR;  Service: Orthopedics;  Laterality: Left;   Patient Active Problem List   Diagnosis Date Noted   Arthritis of left knee    S/P total knee arthroplasty,  left 09/05/2021   S/P total knee arthroplasty, right 10/28/2020   Type 2 diabetes mellitus without complication, without long-term current use of insulin (HCC) 05/15/2019   Hypotestosteronism 05/13/2019   Class 3 severe obesity due to excess calories with serious comorbidity and body mass index (BMI) of 40.0 to 44.9 in adult (HCC) 11/13/2018   Other male erectile dysfunction 05/25/2017   Lichen planus 05/27/2015   Obstructive sleep apnea syndrome 05/06/2015   Gout 06/19/2012   Benign essential hypertension 06/19/2012    PCP: Suzan Slick, MD  REFERRING PROVIDER: Suzan Slick, MD  REFERRING DIAG: M54.50,M79.605 (ICD-10-CM) - Lumbar pain with radiation down left leg M54.32 (ICD-10-CM) - Sciatica of left side  Rationale for Evaluation and Treatment: Rehabilitation  THERAPY DIAG:  Lumbar pain with radiation down left leg  Sciatica of left side  Muscle weakness (generalized)  Difficulty in walking, not elsewhere classified  ONSET DATE: End of 2023  SUBJECTIVE:  SUBJECTIVE STATEMENT: Pt states he had a R TKA in 2022. Did L TKA summer of 2023. Pt reports he was getting swelling in his L knee and radiating pain into the left leg. Pt got imaging performed which demonstrated disc space loss at L5-S1. Pt states pain radiates from L hip down to leg. Pt states pain wakes him up in the middle of the night. Pt states pain is constant. Pt states side of his knee is a little numb but this may be due to the surgery. Does state knee buckles at times  PERTINENT HISTORY:  R TKA in 2022, L TKA in 2023  PAIN:  Are you having pain? Yes: NPRS scale: 5 currently, at worst 11/10 Pain location: L hip radiating down to lateral left knee (primarily) and into lower leg Pain description:  throbbing/aching Aggravating factors: Walking (<5 min), sitting (5-10 min) Relieving factors: medication (to a certain degree), ice/heat   PRECAUTIONS: None  RED FLAGS: None   WEIGHT BEARING RESTRICTIONS: No  FALLS:  Has patient fallen in last 6 months? 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 August Saucer follow up in 2 weeks  OBJECTIVE:   DIAGNOSTIC FINDINGS:  Lumbar x-ray 09/20/22 IMPRESSION: Advanced disc space height loss at L5-S1.  Left knee x-ray 10/23/22 results pending  PATIENT SURVEYS:  FOTO 40; predicted 54  SCREENING FOR RED FLAGS: Bowel or bladder incontinence: No Spinal tumors: No Cauda equina syndrome: No Compression fracture: No Abdominal aneurysm: No  COGNITION: Overall cognitive status: Within functional limits for tasks assessed     SENSATION: N/T L hip to lateral leg  MUSCLE LENGTH: Hamstrings: Right 80 deg; Left 70 deg Thomas test: Right 10 deg; Left 10 deg (pulls on L low back)  POSTURE: posterior pelvic tilt  PALPATION: TTP L piriformis and bilat QL  LUMBAR ROM:   AROM eval  Flexion 90% pain coming up  Extension 100% *  Right lateral flexion 100%  Left lateral flexion 100%  Right rotation 90% increases s/s  Left rotation 90% increases s/s   (Blank rows = not tested)  LOWER EXTREMITY ROM:     Active  Right eval Left eval  Hip flexion    Hip extension    Hip abduction    Hip adduction    Hip internal rotation    Hip external rotation    Knee flexion    Knee extension    Ankle dorsiflexion    Ankle plantarflexion    Ankle inversion    Ankle eversion     (Blank rows = not tested)  LOWER EXTREMITY MMT:    MMT Right eval  Left eval  Hip flexion 4+ 4+  Hip extension 3+ 3+  Hip abduction 4+ 3+  Hip adduction    Hip internal rotation    Hip external rotation    Knee flexion 4 4-  Knee extension 5 5  Ankle dorsiflexion    Ankle plantarflexion    Ankle inversion    Ankle eversion     (Blank rows = not tested)  LUMBAR SPECIAL TESTS:  Straight leg raise test: Positive, Slump test: Negative, Single leg stance test: Positive, FABER test: Positive, and Thomas test: Positive  FUNCTIONAL TESTS:  5 times  sit to stand: 15.26 sec SLS: 5 sec on R, 4 sec on L  GAIT: WNL  TODAY'S TREATMENT:                                                                                                                              DATE: 10/26/22 See HEP below    PATIENT EDUCATION:  Education details: Exam findings, POC, initial HEP Person educated: Patient Education method: Explanation, Demonstration, and Handouts Education comprehension: verbalized understanding, returned demonstration, and needs further education  HOME EXERCISE PROGRAM: Access Code: [email or web address] URL: [email or web address] Date: 10/26/2022 Prepared by: Vernon Prey April Kirstie Peri  Exercises - Prone Press Up  - 1 x daily - 7 x weekly - 1 sets - 10 reps - Supine Piriformis Stretch with Leg Straight  - 1 x daily - 7 x weekly - 2 sets - 30 sec hold - Supine Figure 4  - 1 x daily - 7 x weekly - 2 sets - 30 sec hold - Isometric Gluteus Medius at Wall  - 1 x daily - 7 x weekly - 2 sets - 10 reps - 5 sec hold - Prone Hip Extension on Table  - 1 x daily - 7 x weekly - 2 sets - 10 reps  ASSESSMENT:  CLINICAL IMPRESSION: Patient is a 62 y.o. M who was seen today for physical therapy evaluation and treatment for L lumbar radiculopathy. Assessment significant for L>R glute weakness, core weakness, piriformis tightness with radiating symptoms from L hip to lateral L leg. Increased symptom centralization when pt performed prone press up. Pt would benefit  from PT to address these deficits for improved overall function with work and community tasks. Pt states he will be on vacation next week -- will start PT when he returns.    OBJECTIVE IMPAIRMENTS: Abnormal gait, decreased activity tolerance, decreased balance, decreased endurance, decreased mobility, difficulty walking, decreased ROM, decreased strength, increased fascial restrictions, increased muscle spasms, impaired flexibility, impaired sensation, improper body mechanics, postural dysfunction, and pain.   ACTIVITY LIMITATIONS: bending, sitting, standing, squatting, stairs, transfers, and locomotion level  PARTICIPATION LIMITATIONS: meal prep, cleaning, laundry, driving, shopping, community activity, occupation, and yard work  PERSONAL FACTORS: Age, Fitness, Past/current experiences, and Time since onset of injury/illness/exacerbation are also affecting patient's functional outcome.   REHAB POTENTIAL: Good  CLINICAL DECISION MAKING: Evolving/moderate complexity  EVALUATION COMPLEXITY: Moderate   GOALS: Goals reviewed with patient? Yes  SHORT TERM GOALS: Target date: 11/30/2022   Pt will be ind with initial HEP Baseline: Goal status: INITIAL  2.  Pt will demo 5x STS of </=13 sec for increased functional LE strength Baseline:  Goal status: INITIAL   LONG TERM GOALS: Target date: 12/28/2022   Pt will be ind with progression and management of HEP Baseline:  Goal status: INITIAL  2.  Pt will be able to walk at least 20 min with pain </=3/10 Baseline:  Goal status: INITIAL  3.  Pt will be  able to sit to perform desk/work functions for at least 45 min with pain </=3/10 Baseline:  Goal status: INITIAL  4.  Pt will be able to perform SLS for at least 10 sec on R & L LE to demo improved LE/core stability in standing Baseline:  Goal status: INITIAL  5.  Pt will have improved FOTO to >/=54 Baseline:  Goal status: INITIAL   PLAN:  PT FREQUENCY: 2x/week (starting after  next week when pt returns from vacation)  PT DURATION: 8 weeks  PLANNED INTERVENTIONS: Therapeutic exercises, Therapeutic activity, Neuromuscular re-education, Balance training, Gait training, Patient/Family education, Self Care, Joint mobilization, Stair training, Aquatic Therapy, Dry Needling, Electrical stimulation, Spinal mobilization, Cryotherapy, Moist heat, Taping, Vasopneumatic device, Ionotophoresis 4mg /ml Dexamethasone, Manual therapy, and Re-evaluation.  PLAN FOR NEXT SESSION: Assess response to HEP. Consider manual work/TPDN for L piriformis. Work on core and hip strengthening. McKenzie extension if indicated (it helped centralize it to his hips on eval)   Mouhamad Teed April Ma L Everlena Mackley, PT 10/26/2022, 5:06 PM

## 2022-10-27 ENCOUNTER — Encounter: Payer: Self-pay | Admitting: Physical Medicine & Rehabilitation

## 2022-11-08 ENCOUNTER — Ambulatory Visit: Payer: 59 | Admitting: Orthopedic Surgery

## 2022-11-08 ENCOUNTER — Other Ambulatory Visit: Payer: Self-pay

## 2022-11-08 ENCOUNTER — Encounter: Payer: Self-pay | Admitting: Orthopedic Surgery

## 2022-11-08 DIAGNOSIS — G8929 Other chronic pain: Secondary | ICD-10-CM | POA: Diagnosis not present

## 2022-11-08 DIAGNOSIS — Z96652 Presence of left artificial knee joint: Secondary | ICD-10-CM

## 2022-11-08 DIAGNOSIS — M25562 Pain in left knee: Secondary | ICD-10-CM

## 2022-11-08 NOTE — Progress Notes (Signed)
Office Visit Note   Patient: Ernest Navarro.           Date of Birth: [birth year]           MRN: [id number] Visit Date: 11/08/2022 Requested by: Suzan Slick, MD [address] [place],  [place] [address] PCP: Suzan Slick, MD  Subjective: Chief Complaint  Patient presents with   Left Knee - Pain    HPI: Landy Bulger. is a 62 y.o. male who presents to the office reporting left knee pain and swelling.  Patient underwent left total knee replacement 09/20/2021.  Working from home.  Describes pain primarily in the suprapatellar region.  Taking Tylenol for pain..                ROS: All systems reviewed are negative as they relate to the chief complaint within the history of present illness.  Patient denies fevers or chills.  Assessment & Plan: Visit Diagnoses:  1. Chronic pain of left knee   2. S/P total knee arthroplasty, left     Plan: Impression is left total knee pain which appears to be primarily suprapatellar in nature around the quad tendon attachment site.  Radiographically the knee looks good.  No effusion.  Very stable with excellent range of motion from 0 to about 1 15-1 20.  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  Limiting travel would be beneficial due to his decreased walking endurance.  Patient also has increased body mass index which puts additional strain on the knee.  He is trying to limit the amount of nonover-the-counter medication he takes for pain.  Follow-Up Instructions: No follow-ups on file.   Orders:  No orders of the defined types were placed in this encounter.  No orders of the defined types were placed in this encounter.     Procedures: No procedures performed   Clinical Data: No additional findings.  Objective: Vital Signs: There were no vitals taken for this visit.  Physical Exam:  Constitutional: Patient appears  well-developed HEENT:  Head: Normocephalic Eyes:EOM are normal Neck: Normal range of motion Cardiovascular: Normal rate Pulmonary/chest: Effort normal Neurologic: Patient is alert Skin: Skin is warm Psychiatric: Patient has normal mood and affect  Ortho Exam: Ortho exam demonstrates no effusion in the left knee.  Range of motion 0-1 20.  Stable collateral ligaments at 0 30 degrees.  Extensor mechanism intact.  No warmth to the left knee.  Specialty Comments:  No specialty comments available.  Imaging: No results found.   PMFS History: 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   S/P total knee arthroplasty, right 10/28/2020   Type 2 diabetes mellitus without complication, without long-term current use of insulin (HCC) 05/15/2019   Hypotestosteronism 05/13/2019   Class 3 severe obesity due to excess calories with serious comorbidity and body mass index (BMI) of 40.0 to 44.9 in adult [facility]) 11/13/2018   Other male erectile dysfunction 05/25/2017   Lichen planus 05/27/2015   Obstructive sleep apnea syndrome 05/06/2015   Gout 06/19/2012   Benign essential hypertension 06/19/2012   Past Medical History:  Diagnosis Date   Arthritis    bilateral knees   Class 3 severe obesity due to excess calories with serious comorbidity and body mass index (BMI) of 40.0  to 44.9 in adult [facility]) 11/13/2018   Essential hypertension, benign 06/19/2012   Obstructive sleep apnea syndrome 05/06/2015   Formatting of this note might be different from the original. Overview: High probability diagnosis based on previous reported sleep study and weight gain with witnessed apneas and loud snoring. We discussed the basic physiology and medical concerns. Plan-schedule sleep study, emphasize driving responsibility and advise weight loss Last Assessment & Plan: Formatting of this note might be different    Other male erectile  dysfunction 05/25/2017   Sleep apnea    Type 2 diabetes mellitus without complication, without long-term current use of insulin (HCC) 05/15/2019    Family History  Problem Relation Age of Onset   Lymphoma Brother    Diabetes Maternal Grandmother    Colon cancer Neg Hx    Pancreatic cancer Neg Hx    Stomach cancer Neg Hx    Esophageal cancer Neg Hx    Liver disease Neg Hx     Past Surgical History:  Procedure Laterality Date   KNEE SURGERY Right    x3 arthroscopies   TOTAL KNEE ARTHROPLASTY Right 10/28/2020   Procedure: RIGHT TOTAL KNEE ARTHROPLASTY;  Surgeon: Cammy Copa, MD;  Location: [facility] OR;  Service: Orthopedics;  Laterality: Right;   TOTAL KNEE ARTHROPLASTY Left 09/05/2021   Procedure: LEFT TOTAL KNEE ARTHROPLASTY;  Surgeon: Cammy Copa, MD;  Location: [facility] OR;  Service: Orthopedics;  Laterality: Left;   Social History   Occupational History   Not on file  Tobacco Use   Smoking status: Never   Smokeless tobacco: Never  Vaping Use   Vaping status: Never Used  Substance and Sexual Activity   Alcohol use: No    Alcohol/week: 0.0 standard drinks of alcohol   Drug use: No   Sexual activity: Not on file

## 2022-11-09 ENCOUNTER — Encounter: Payer: 59 | Attending: Physical Medicine & Rehabilitation | Admitting: Physical Medicine & Rehabilitation

## 2022-11-09 ENCOUNTER — Encounter: Payer: Self-pay | Admitting: Physical Medicine & Rehabilitation

## 2022-11-09 ENCOUNTER — Encounter: Payer: 59 | Admitting: Physical Medicine & Rehabilitation

## 2022-11-09 DIAGNOSIS — M545 Low back pain, unspecified: Secondary | ICD-10-CM | POA: Insufficient documentation

## 2022-11-09 DIAGNOSIS — M79605 Pain in left leg: Secondary | ICD-10-CM | POA: Diagnosis present

## 2022-11-09 DIAGNOSIS — M5432 Sciatica, left side: Secondary | ICD-10-CM | POA: Diagnosis present

## 2022-11-09 MED ORDER — GABAPENTIN 400 MG PO CAPS
400.0000 mg | ORAL_CAPSULE | Freq: Two times a day (BID) | ORAL | 1 refills | Status: DC
Start: 2022-11-09 — End: 2023-03-19

## 2022-11-09 NOTE — Progress Notes (Signed)
Subjective:    Patient ID: Ernest Crocker., male    DOB: Sep 17, 1960, 62 y.o.   MRN: [id number] Referred by PCP  HPI CC:  Low back pain radiating  to outside of left ankle  62 year old male with greater than 3 months duration of low back pain.  He has been evaluated by orthopedics but mainly in terms of his knee pain.  He has seen his primary care physician who started gabapentin because the patient was complaining of pain going down the left leg.  The patient states that the leg pain hurts more than the back pain.  He has some numbness in the left lateral thigh as well as the left ankle.  He has numbness at the lateral aspect of both knees since his total knee replacements.  He does not have any new bowel or bladder dysfunction no new lower extremity weakness.  He has undergone x-rays of the lumbar spine which were reviewed with the patient that shows lower lumbar facet arthropathy at L4-5 L5-S1 as well as L5-S1 moderate to severe disc space loss. The patient has never had back surgery in the past.  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  Gabapentin prescribed by PCP, no side effects but   Tramadol after knee surgery  but now taking tylenol  Takes celebrex once a day , prescribed twice a day   Occ takes cyclobenzaprine   CLINICAL DATA:  Low back pain with left leg pain   EXAM: LUMBAR SPINE - COMPLETE 4+ VIEW   COMPARISON:  None Available.   FINDINGS: Segmentation: 5 lumbar-type vertebral bodies based on the available coverage. No evidence of acute fracture or traumatic listhesis. Mild disc space height loss at L4-L5 and advanced disc space height loss at L5-S1. Moderate to advanced lower lumbar facet arthropathy.   IMPRESSION: Advanced disc space height loss at L5-S1.     Electronically Signed   By: Minerva Fester M.D.   On: 09/24/2022 22:18  Left Knee TKR 1 yr ago RIght knee TKR ~51yr ago    Pain Inventory Average Pain 5 Pain Right Now 7 My pain is constant, sharp, stabbing, and aching  In the last 24 hours, has pain interfered with the following? General activity 7 Relation with others 7 Enjoyment of life 9 What TIME of day is your pain at its worst? morning , daytime, evening, and night Sleep (in general) Poor  Pain is worse with: walking, sitting, inactivity, and standing Pain improves with: rest, therapy/exercise, and medication Relief from Meds: 10  walk without assistance how many minutes can you walk? 5 ability to climb steps?  yes do you drive?  yes Do you have any goals in this area?  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  yes  bowel control problems weakness tingling spasms  X-ray  New patient    Family History  Problem Relation Age of Onset   Lymphoma Brother    Diabetes Maternal Grandmother    Colon cancer Neg Hx    Pancreatic cancer Neg Hx    Stomach cancer Neg Hx    Esophageal cancer Neg Hx    Liver disease Neg Hx    Social History   Socioeconomic History   Marital status: Single    Spouse name: Not on file   Number of children: Not on file   Years  of education: Not on file   Highest education level: Not on file  Occupational History   Not on file  Tobacco Use   Smoking status: Never   Smokeless tobacco: Never  Vaping Use   Vaping status: Never Used  Substance and Sexual Activity   Alcohol use: No    Alcohol/week: 0.0 standard drinks of alcohol   Drug use: No   Sexual activity: Not on file  Other Topics Concern   Not on file  Social History Narrative   Not on file   Social Determinants of Health   Financial Resource Strain: Not on file  Food Insecurity: Not on file  Transportation Needs: Not on file  Physical Activity: Not on file  Stress: Not on file  Social Connections: Unknown (08/13/2021)   Received from [facility]   Social  Network    Social Network: Not on file   Past Surgical History:  Procedure Laterality Date   KNEE SURGERY Right    x3 arthroscopies   TOTAL KNEE ARTHROPLASTY Right 10/28/2020   Procedure: RIGHT TOTAL KNEE ARTHROPLASTY;  Surgeon: Cammy Copa, MD;  Location: [facility] OR;  Service: Orthopedics;  Laterality: Right;   TOTAL KNEE ARTHROPLASTY Left 09/05/2021   Procedure: LEFT TOTAL KNEE ARTHROPLASTY;  Surgeon: Cammy Copa, MD;  Location: [facility] OR;  Service: Orthopedics;  Laterality: Left;   Past Medical History:  Diagnosis Date   Arthritis    bilateral knees   Class 3 severe obesity due to excess calories with serious comorbidity and body mass index (BMI) of 40.0 to 44.9 in adult [facility]) 11/13/2018   Essential hypertension, benign 06/19/2012   Obstructive sleep apnea syndrome 05/06/2015   Formatting of this note might be different from the original. Overview: High probability diagnosis based on previous reported sleep study and weight gain with witnessed apneas and loud snoring. We discussed the basic physiology and medical concerns. Plan-schedule sleep study, emphasize driving responsibility and advise weight loss Last Assessment & Plan: Formatting of this note might be different    Other male erectile dysfunction 05/25/2017   Sleep apnea    Type 2 diabetes mellitus without complication, without long-term current use of insulin (HCC) 05/15/2019   Ht 6' (1.829 m)   BMI 42.38 kg/m   Opioid Risk Score:   Fall Risk Score:  `1  Depression screen [facility] 2/9     04/05/2022    9:38 AM  Depression screen PHQ 2/9  Decreased Interest 0  Down, Depressed, Hopeless 0  PHQ - 2 Score 0  Altered sleeping 1  Tired, decreased energy 1  Change in appetite 1  Feeling bad or failure about yourself  0  Trouble concentrating 0  Suicidal thoughts 0  PHQ-9 Score 3  Difficult doing work/chores Not difficult at all     Review of Systems  Musculoskeletal:  Positive for back pain.       Lt hip thigh  leg pain  All other systems reviewed and are negative.      Objective:   Physical Exam  General No acute distress Mood and affect appropriate Extremities without edema except left lower has trace ankle edema. Motor strength is 5/5 bilateral hip flexor knee extensor ankle dorsiflexor Negative straight leg raising bilaterally Sensation is reduced over the left lateral thigh as well as medial ankle on the left side.  Normal sensation on the right side in these areas normal sensation at bilateral L2 L5 and S1 dermatome distribution. Deep tendon reflexes are absent  bilateral knees and ankles. Lumbar spine has some tenderness palpation right PSIS  Sacral thrust (prone) : Positive on the right Lateral compression: Negative FABER's: Positive bilaterally in SI Distraction (supine): Negative Thigh thrust test: Positive bilaterally in SI  Lumbar spine range of motion 50% flexion extension lateral bending and rotation.  He does have pain with left lateral bending.  Also endrange with flexion and extension. Ambulates without assistive device no evidence toe drag and instability Bilateral knees have full range of motion there is mild swelling at the left knee.  Knee incisions are well-healed.  Hip joints have good range of motion bilaterally.      Assessment & Plan:   #1.  Left lower extremity pain appears to be sciatic.  He does have lower lumbar facet arthropathy.  His pain and sensation issues correspond to the left L3 and left L4 dermatomal distribution.  Will increase gabapentin 400 mg twice daily will may need to titrate upward from there Patient will call in about a week to let us know how the medication is helping him.  We may have to go up to 600 mg or increase the frequency. In addition patient will complete his outpatient PT which will now focus on the lumbar spine issues I will see him back in 4 weeks.  At that point we will decide whether he is making any improvements and whether  further imaging may be needed. Will avoid narcotic analgesics given his work demands. 2.  Possible sacroiliac disorder this may not be primary issue could be a cause of pseudo sciatica.

## 2022-11-13 ENCOUNTER — Encounter (HOSPITAL_COMMUNITY)
Admission: RE | Admit: 2022-11-13 | Discharge: 2022-11-13 | Disposition: A | Discharge status: Home / Self Care | Payer: 59 | Source: Ambulatory Visit | Attending: Nurse Practitioner | Admitting: Nurse Practitioner

## 2022-11-13 MED ORDER — TECHNETIUM TC 99M SESTAMIBI - CARDIOLITE
26.7000 | Freq: Once | INTRAVENOUS | Status: AC | PRN
  Administered 2022-11-13: 26.7 via INTRAVENOUS

## 2022-11-14 ENCOUNTER — Ambulatory Visit: Payer: 59 | Attending: Family Medicine | Admitting: Physical Therapy

## 2022-11-14 ENCOUNTER — Encounter: Payer: Self-pay | Admitting: Physical Therapy

## 2022-11-14 DIAGNOSIS — M25662 Stiffness of left knee, not elsewhere classified: Secondary | ICD-10-CM | POA: Insufficient documentation

## 2022-11-14 DIAGNOSIS — R262 Difficulty in walking, not elsewhere classified: Secondary | ICD-10-CM | POA: Insufficient documentation

## 2022-11-14 DIAGNOSIS — M545 Low back pain, unspecified: Secondary | ICD-10-CM | POA: Insufficient documentation

## 2022-11-14 DIAGNOSIS — M79605 Pain in left leg: Secondary | ICD-10-CM | POA: Diagnosis present

## 2022-11-14 DIAGNOSIS — M25562 Pain in left knee: Secondary | ICD-10-CM | POA: Insufficient documentation

## 2022-11-14 DIAGNOSIS — M5432 Sciatica, left side: Secondary | ICD-10-CM | POA: Insufficient documentation

## 2022-11-14 DIAGNOSIS — M6281 Muscle weakness (generalized): Secondary | ICD-10-CM | POA: Insufficient documentation

## 2022-11-14 NOTE — Therapy (Signed)
OUTPATIENT PHYSICAL THERAPY THORACOLUMBAR TREATMENT   Patient Name: Ernest Navarro. MRN: [id number] DOB:1961/01/31, 62 y.o., male Today's Date: 11/14/2022  END OF SESSION:  PT End of Session - 11/14/22 1524     Visit Number 2    Number of Visits 16    Date for PT Re-Evaluation 12/21/22    Authorization Type [employer]    PT Start Time 1524    PT Stop Time 1605    PT Time Calculation (min) 41 min    Activity Tolerance Patient tolerated treatment well             Past Medical History:  Diagnosis Date   Arthritis    bilateral knees   Class 3 severe obesity due to excess calories with serious comorbidity and body mass index (BMI) of 40.0 to 44.9 in adult (HCC) 11/13/2018   Essential hypertension, benign 06/19/2012   Obstructive sleep apnea syndrome 05/06/2015   Formatting of this note might be different from the original. Overview: High probability diagnosis based on previous reported sleep study and weight gain with witnessed apneas and loud snoring. We discussed the basic physiology and medical concerns. Plan-schedule sleep study, emphasize driving responsibility and advise weight loss Last Assessment & Plan: Formatting of this note might be different    Other male erectile dysfunction 05/25/2017   Sleep apnea    Type 2 diabetes mellitus without complication, without long-term current use of insulin (HCC) 05/15/2019   Past Surgical History:  Procedure Laterality Date   KNEE SURGERY Right    x3 arthroscopies   TOTAL KNEE ARTHROPLASTY Right 10/28/2020   Procedure: RIGHT TOTAL KNEE ARTHROPLASTY;  Surgeon: Cammy Copa, MD;  Location: [facility] OR;  Service: Orthopedics;  Laterality: Right;   TOTAL KNEE ARTHROPLASTY Left 09/05/2021   Procedure: LEFT TOTAL KNEE ARTHROPLASTY;  Surgeon: Cammy Copa, MD;  Location: [facility] OR;  Service: Orthopedics;  Laterality: Left;   Patient Active Problem List   Diagnosis Date Noted   Arthritis of left knee    S/P total knee arthroplasty, left  09/05/2021   S/P total knee arthroplasty, right 10/28/2020   Type 2 diabetes mellitus without complication, without long-term current use of insulin (HCC) 05/15/2019   Hypotestosteronism 05/13/2019   Class 3 severe obesity due to excess calories with serious comorbidity and body mass index (BMI) of 40.0 to 44.9 in adult (HCC) 11/13/2018   Other male erectile dysfunction 05/25/2017   Lichen planus 05/27/2015   Obstructive sleep apnea syndrome 05/06/2015   Gout 06/19/2012   Benign essential hypertension 06/19/2012    PCP: Suzan Slick, MD  REFERRING PROVIDER: Suzan Slick, MD  REFERRING DIAG: M54.50,M79.605 (ICD-10-CM) - Lumbar pain with radiation down left leg M54.32 (ICD-10-CM) - Sciatica of left side  Rationale for Evaluation and Treatment: Rehabilitation  THERAPY DIAG:  Lumbar pain with radiation down left leg  Sciatica of left side  Muscle weakness (generalized)  Difficulty in walking, not elsewhere classified  Acute pain of left knee  Stiffness of left knee, not elsewhere classified  ONSET DATE: End of 2023  SUBJECTIVE:  SUBJECTIVE STATEMENT: Pt states his knees hurt more than his back today. Reports he had to walk around [facility] for a procedure which irritated his knees. Pt reports he saw ortho who saw patellar tendonitis in his L knee. Back pain is not going down to his knees today -- staying primarily in his back.  PERTINENT HISTORY:  R TKA in 2022, L TKA in 2023 From eval: Pt states he had a R TKA in 2022. Did L TKA summer of 2023. Pt reports he was getting swelling in his L knee and radiating pain into the left leg. Pt got imaging performed which demonstrated disc space loss at L5-S1. Pt states pain radiates from L hip down to leg. Pt states pain wakes him up in the  middle of the night. Pt states pain is constant. Pt states side of his knee is a little numb but this may be due to the surgery. Does state knee buckles at times  PAIN:  Are you having pain? Yes: NPRS scale: 9 in knee currently, 4 in back currently/10 Pain location: L hip radiating down to lateral left knee (primarily) and into lower leg Pain description: throbbing/aching Aggravating factors: Walking (<5 min), sitting (5-10 min) Relieving factors: medication (to a certain degree), ice/heat   PRECAUTIONS: None  RED FLAGS: None   WEIGHT BEARING RESTRICTIONS: No  FALLS:  Has patient fallen in last 6 months? 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 August Saucer follow up in 2 weeks  OBJECTIVE:   DIAGNOSTIC FINDINGS:  Lumbar x-ray 09/20/22 IMPRESSION: Advanced disc space height loss at L5-S1.  Left knee x-ray 10/23/22 results pending  PATIENT SURVEYS:  FOTO 40; predicted 54     SENSATION: N/T L hip to lateral leg  MUSCLE LENGTH: Hamstrings: Right 80 deg; Left 70 deg Thomas test: Right 10 deg; Left 10 deg (pulls on L low back)  POSTURE: posterior pelvic tilt  PALPATION: TTP L piriformis and bilat QL  LUMBAR ROM:   AROM eval  Flexion 90% pain coming up  Extension 100% *  Right lateral flexion 100%  Left lateral flexion 100%  Right rotation 90% increases s/s  Left rotation 90% increases s/s   (Blank rows = not tested)  LOWER EXTREMITY ROM:     Active  Right eval Left eval  Hip flexion    Hip extension    Hip abduction    Hip adduction    Hip internal rotation    Hip external rotation    Knee flexion    Knee extension    Ankle dorsiflexion    Ankle plantarflexion     Ankle inversion    Ankle eversion     (Blank rows = not tested)  LOWER EXTREMITY MMT:    MMT Right eval Left eval  Hip flexion 4+ 4+  Hip extension 3+ 3+  Hip abduction 4+ 3+  Hip adduction    Hip internal rotation    Hip external rotation    Knee flexion 4 4-  Knee extension 5 5  Ankle dorsiflexion    Ankle plantarflexion    Ankle inversion    Ankle eversion     (Blank rows = not tested)  LUMBAR SPECIAL TESTS:  Straight  leg raise test: Positive, Slump test: Negative, Single leg stance test: Positive, FABER test: Positive, and Thomas test: Positive  FUNCTIONAL TESTS:  5 times sit to stand: 15.26 sec SLS: 5 sec on R, 4 sec on L  GAIT: WNL  TODAY'S TREATMENT:    [facility] Adult PT Treatment:                                                DATE: 11/14/22 Therapeutic Exercise: Seated hamstring stretch x 30 sec B Supine ITB stretch x 30 sec B Sidelying quad stretch with strap x 1 min Sidelying hip flexor stretch x 1 min Sidelying hip ext iso against wall 10 x3 sec Sidelying hip ext iso against wall into clamshell x10  Supine SLR + PPT 2x10 Supine piriformis stretch x 30 sec Manual Therapy: IASTM glute med/max Modalities: Vaso, medium compression x 10 min, 34 deg L knee Ice pack R knee x10 min                                                                                                                             DATE: 10/26/22 See HEP below    PATIENT EDUCATION:  Education details: Exam findings, POC, initial HEP Person educated: Patient Education method: Explanation, Demonstration, and Handouts Education comprehension: verbalized understanding, returned demonstration, and needs further education  HOME EXERCISE PROGRAM: Access Code: [email or web address] URL: [email or web address] Date: 10/26/2022 Prepared by: Vernon Prey April Kirstie Peri  Exercises - Prone Press Up  - 1 x daily - 7 x weekly - 1 sets - 10 reps - Supine Piriformis Stretch with Leg Straight  -  1 x daily - 7 x weekly - 2 sets - 30 sec hold - Supine Figure 4  - 1 x daily - 7 x weekly - 2 sets - 30 sec hold - Isometric Gluteus Medius at Wall  - 1 x daily - 7 x weekly - 2 sets - 10 reps - 5 sec hold - Prone Hip Extension on Table  - 1 x daily - 7 x weekly - 2 sets - 10 reps  ASSESSMENT:  CLINICAL IMPRESSION: Treatment focused on hip and core strengthening. Worked on stretching hips for improved mobility. Provided ice to address knee pain/soreness.  From eval: Patient is a 62 y.o. M who was seen today for physical therapy evaluation and treatment for L lumbar radiculopathy. Assessment significant for L>R glute weakness, core weakness, piriformis tightness with radiating symptoms from L hip to lateral L leg. Increased symptom centralization when pt performed prone press up. Pt would benefit from PT to address these deficits for improved overall function with work and community tasks. Pt states he will be on vacation next week -- will start PT when he returns.      GOALS: Goals reviewed with patient? Yes  SHORT TERM GOALS: Target  date: 11/30/2022   Pt will be ind with initial HEP Baseline: Goal status: INITIAL  2.  Pt will demo 5x STS of </=13 sec for increased functional LE strength Baseline:  Goal status: INITIAL   LONG TERM GOALS: Target date: 12/28/2022   Pt will be ind with progression and management of HEP Baseline:  Goal status: INITIAL  2.  Pt will be able to walk at least 20 min with pain </=3/10 Baseline:  Goal status: INITIAL  3.  Pt will be able to sit to perform desk/work functions for at least 45 min with pain </=3/10 Baseline:  Goal status: INITIAL  4.  Pt will be able to perform SLS for at least 10 sec on R & L LE to demo improved LE/core stability in standing Baseline:  Goal status: INITIAL  5.  Pt will have improved FOTO to >/=54 Baseline:  Goal status: INITIAL   PLAN:  PT FREQUENCY: 2x/week (starting after next week when pt returns from  vacation)  PT DURATION: 8 weeks  PLANNED INTERVENTIONS: Therapeutic exercises, Therapeutic activity, Neuromuscular re-education, Balance training, Gait training, Patient/Family education, Self Care, Joint mobilization, Stair training, Aquatic Therapy, Dry Needling, Electrical stimulation, Spinal mobilization, Cryotherapy, Moist heat, Taping, Vasopneumatic device, Ionotophoresis 4mg /ml Dexamethasone, Manual therapy, and Re-evaluation.  PLAN FOR NEXT SESSION: Assess response to HEP. Consider manual work/TPDN for L piriformis. Work on core and hip strengthening. McKenzie extension if indicated (it helped centralize it to his hips on eval)    April Ma L [place], PT 11/14/2022, 3:24 PM

## 2022-11-16 ENCOUNTER — Ambulatory Visit: Payer: 59 | Admitting: Physical Therapy

## 2022-11-16 NOTE — Therapy (Deleted)
OUTPATIENT PHYSICAL THERAPY THORACOLUMBAR TREATMENT   Patient Name: Ernest Navarro. MRN: [id number] DOB:1961/03/05, 62 y.o., male Today's Date: 11/16/2022  END OF SESSION:    Past Medical History:  Diagnosis Date   Arthritis    bilateral knees   Class 3 severe obesity due to excess calories with serious comorbidity and body mass index (BMI) of 40.0 to 44.9 in adult [facility]) 11/13/2018   Essential hypertension, benign 06/19/2012   Obstructive sleep apnea syndrome 05/06/2015   Formatting of this note might be different from the original. Overview: High probability diagnosis based on previous reported sleep study and weight gain with witnessed apneas and loud snoring. We discussed the basic physiology and medical concerns. Plan-schedule sleep study, emphasize driving responsibility and advise weight loss Last Assessment & Plan: Formatting of this note might be different    Other male erectile dysfunction 05/25/2017   Sleep apnea    Type 2 diabetes mellitus without complication, without long-term current use of insulin (HCC) 05/15/2019   Past Surgical History:  Procedure Laterality Date   KNEE SURGERY Right    x3 arthroscopies   TOTAL KNEE ARTHROPLASTY Right 10/28/2020   Procedure: RIGHT TOTAL KNEE ARTHROPLASTY;  Surgeon: Cammy Copa, MD;  Location: [facility] OR;  Service: Orthopedics;  Laterality: Right;   TOTAL KNEE ARTHROPLASTY Left 09/05/2021   Procedure: LEFT TOTAL KNEE ARTHROPLASTY;  Surgeon: Cammy Copa, MD;  Location: [facility] OR;  Service: Orthopedics;  Laterality: Left;   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   S/P total knee arthroplasty, right 10/28/2020   Type 2 diabetes mellitus without complication, without long-term current use of insulin (HCC) 05/15/2019   Hypotestosteronism 05/13/2019   Class 3 severe obesity due to excess calories with serious comorbidity and body mass index  (BMI) of 40.0 to 44.9 in adult (HCC) 11/13/2018   Other male erectile dysfunction 05/25/2017   Lichen planus 05/27/2015   Obstructive sleep apnea syndrome 05/06/2015   Gout 06/19/2012   Benign essential hypertension 06/19/2012    PCP: Suzan Slick, MD  REFERRING PROVIDER: Suzan Slick, MD  REFERRING DIAG: M54.50,M79.605 (ICD-10-CM) - Lumbar pain with radiation down left leg M54.32 (ICD-10-CM) - Sciatica of left side  Rationale for Evaluation and Treatment: Rehabilitation  THERAPY DIAG:  No diagnosis found.  ONSET DATE: End of 2023  SUBJECTIVE:  SUBJECTIVE STATEMENT: *** Pt states his knees hurt more than his back today. Reports he had to walk around [facility] for a procedure which irritated his knees. Pt reports he saw ortho who saw patellar tendonitis in his L knee. Back pain is not going down to his knees today -- staying primarily in his back.  PERTINENT HISTORY:  R TKA in 2022, L TKA in 2023 From eval: Pt states he had a R TKA in 2022. Did L TKA summer of 2023. Pt reports he was getting swelling in his L knee and radiating pain into the left leg. Pt got imaging performed which demonstrated disc space loss at L5-S1. Pt states pain radiates from L hip down to leg. Pt states pain wakes him up in the middle of the night. Pt states pain is constant. Pt states side of his knee is a little numb but this may be due to the surgery. Does state knee buckles at times  PAIN:  Are you having pain? Yes: NPRS scale: 9 in knee currently, 4 in back currently/10 Pain location: L hip radiating down to lateral left knee (primarily) and into lower leg Pain description: throbbing/aching Aggravating factors: Walking (<5 min), sitting (5-10 min) Relieving factors: medication (to a certain degree), ice/heat    PRECAUTIONS: None  RED FLAGS: None   WEIGHT BEARING RESTRICTIONS: No  FALLS:  Has patient fallen in last 6 months? 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 August Saucer follow up in 2 weeks  OBJECTIVE:   DIAGNOSTIC FINDINGS:  Lumbar x-ray 09/20/22 IMPRESSION: Advanced disc space height loss at L5-S1.  Left knee x-ray 10/23/22 IMPRESSION: 1. Knee total arthroplasty without complication. 2. Minimal knee joint effusion, although diminished from prior exam.    PATIENT SURVEYS:  FOTO 40; predicted 54     SENSATION: N/T L hip to lateral leg  MUSCLE LENGTH: Hamstrings: Right 80 deg; Left 70 deg Thomas test: Right 10 deg; Left 10 deg (pulls on L low back)  POSTURE: posterior pelvic tilt  PALPATION: TTP L piriformis and bilat QL  LUMBAR ROM:   AROM eval  Flexion 90% pain coming up  Extension 100% *  Right lateral flexion 100%  Left lateral flexion 100%  Right rotation 90% increases s/s  Left rotation 90% increases s/s   (Blank rows = not tested)  LOWER EXTREMITY ROM:     Active  Right eval Left eval  Hip flexion    Hip extension    Hip abduction    Hip adduction    Hip internal rotation    Hip external rotation    Knee flexion    Knee extension    Ankle dorsiflexion    Ankle plantarflexion    Ankle inversion    Ankle eversion     (Blank rows = not tested)  LOWER EXTREMITY MMT:    MMT Right eval Left eval  Hip flexion 4+ 4+  Hip extension 3+ 3+  Hip abduction 4+ 3+  Hip adduction    Hip internal rotation    Hip external rotation    Knee flexion 4 4-  Knee extension 5 5  Ankle dorsiflexion    Ankle plantarflexion    Ankle inversion  Ankle eversion      (Blank rows = not tested)  LUMBAR SPECIAL TESTS:  Straight leg raise test: Positive, Slump test: Negative, Single leg stance test: Positive, FABER test: Positive, and Thomas test: Positive  FUNCTIONAL TESTS:  5 times sit to stand: 15.26 sec SLS: 5 sec on R, 4 sec on L  GAIT: WNL  TODAY'S TREATMENT:    [facility] Adult PT Treatment:                                                DATE: 11/16/22 Therapeutic Exercise: *** Manual Therapy: *** Neuromuscular re-ed: *** Therapeutic Activity: *** Gait: *** Modalities: *** Self Care: ***   [facility] Adult PT Treatment:                                                DATE: 11/14/22 Therapeutic Exercise: Seated hamstring stretch x 30 sec B Supine ITB stretch x 30 sec B Sidelying quad stretch with strap x 1 min Sidelying hip flexor stretch x 1 min Sidelying hip ext iso against wall 10 x3 sec Sidelying hip ext iso against wall into clamshell x10  Supine SLR + PPT 2x10 Supine piriformis stretch x 30 sec Manual Therapy: IASTM glute med/max Modalities: Vaso, medium compression x 10 min, 34 deg L knee Ice pack R knee x10 min                                                                                                                             DATE: 10/26/22 See HEP below    PATIENT EDUCATION:  Education details: Exam findings, POC, initial HEP Person educated: Patient Education method: Explanation, Demonstration, and Handouts Education comprehension: verbalized understanding, returned demonstration, and needs further education  HOME EXERCISE PROGRAM: Access Code: [email or web address] URL: [email or web address] Date: 10/26/2022 Prepared by: Vernon Prey April Kirstie Peri  Exercises - Prone Press Up  - 1 x daily - 7 x weekly - 1 sets - 10 reps - Supine Piriformis Stretch with Leg Straight  - 1 x daily - 7 x weekly - 2 sets - 30 sec hold - Supine Figure 4  - 1 x daily - 7 x weekly - 2 sets - 30 sec hold - Isometric Gluteus Medius  at Wall  - 1 x daily - 7 x weekly - 2 sets - 10 reps - 5 sec hold - Prone Hip Extension on Table  - 1 x daily - 7 x weekly - 2 sets - 10 reps  ASSESSMENT:  CLINICAL IMPRESSION: *** Treatment focused on hip and core strengthening. Worked on stretching hips for improved mobility. Provided ice to address knee pain/soreness.  From eval: Patient is a 62 y.o. M  who was seen today for physical therapy evaluation and treatment for L lumbar radiculopathy. Assessment significant for L>R glute weakness, core weakness, piriformis tightness with radiating symptoms from L hip to lateral L leg. Increased symptom centralization when pt performed prone press up. Pt would benefit from PT to address these deficits for improved overall function with work and community tasks. Pt states he will be on vacation next week -- will start PT when he returns.      GOALS: Goals reviewed with patient? Yes  SHORT TERM GOALS: Target date: 11/30/2022   Pt will be ind with initial HEP Baseline: Goal status: INITIAL  2.  Pt will demo 5x STS of </=13 sec for increased functional LE strength Baseline:  Goal status: INITIAL   LONG TERM GOALS: Target date: 12/28/2022   Pt will be ind with progression and management of HEP Baseline:  Goal status: INITIAL  2.  Pt will be able to walk at least 20 min with pain </=3/10 Baseline:  Goal status: INITIAL  3.  Pt will be able to sit to perform desk/work functions for at least 45 min with pain </=3/10 Baseline:  Goal status: INITIAL  4.  Pt will be able to perform SLS for at least 10 sec on R & L LE to demo improved LE/core stability in standing Baseline:  Goal status: INITIAL  5.  Pt will have improved FOTO to >/=54 Baseline:  Goal status: INITIAL   PLAN:  PT FREQUENCY: 2x/week (starting after next week when pt returns from vacation)  PT DURATION: 8 weeks  PLANNED INTERVENTIONS: Therapeutic exercises, Therapeutic activity, Neuromuscular re-education, Balance  training, Gait training, Patient/Family education, Self Care, Joint mobilization, Stair training, Aquatic Therapy, Dry Needling, Electrical stimulation, Spinal mobilization, Cryotherapy, Moist heat, Taping, Vasopneumatic device, Ionotophoresis 4mg /ml Dexamethasone, Manual therapy, and Re-evaluation.  PLAN FOR NEXT SESSION: Assess response to HEP. Consider manual work/TPDN for L piriformis. Work on core and hip strengthening. McKenzie extension if indicated (it helped centralize it to his hips on eval)    April Ma L , PT 11/16/2022, 7:55 AM

## 2022-11-18 ENCOUNTER — Other Ambulatory Visit: Payer: Self-pay | Admitting: Family Medicine

## 2022-11-18 DIAGNOSIS — I10 Essential (primary) hypertension: Secondary | ICD-10-CM

## 2022-11-21 ENCOUNTER — Ambulatory Visit: Payer: 59 | Admitting: Rehabilitative and Restorative Service Providers"

## 2022-11-22 ENCOUNTER — Ambulatory Visit (INDEPENDENT_AMBULATORY_CARE_PROVIDER_SITE_OTHER): Payer: 59 | Admitting: Sports Medicine

## 2022-11-22 ENCOUNTER — Encounter: Payer: Self-pay | Admitting: Sports Medicine

## 2022-11-22 VITALS — BP 143/96 | HR 90 | Ht 73.0 in | Wt 315.0 lb

## 2022-11-22 DIAGNOSIS — Z96652 Presence of left artificial knee joint: Secondary | ICD-10-CM

## 2022-11-22 DIAGNOSIS — M76892 Other specified enthesopathies of left lower limb, excluding foot: Secondary | ICD-10-CM

## 2022-11-22 NOTE — Progress Notes (Signed)
Ernest Navarro. - 62 y.o. male MRN [id number]  Date of birth: 06/26/1960  Office Visit Note: Visit Date: 11/22/2022 PCP: Suzan Slick, MD Referred by: Cammy Copa, MD  Subjective: Chief Complaint  Patient presents with   Left Leg - Pain   Medical Resident - Attending Physician Addendum:   I have independently interviewed and examined the patient myself. I have discussed the above with the original author and agree with their documentation. My edits for correction/addition/clarification have been made, see any changes above and below.   In summary, pleasant 62 year old male with residual left knee and quadricep pain, status post left total knee replacement 09/20/2021.  Has completed formalized physical therapy and taking anti-inflammatories without full relief.  Did perform trial of extracorporeal shockwave treatment therapy, patient tolerated well.  Will have him follow-up in 1 week and after 2 treatments we will see what degree of improvement he has before deciding on additional treatment.  Okay to continue HEP, meloxicam as needed.  Madelyn Brunner, DO Primary Care Sports Medicine Physician  [facility] - Orthopedics _________________________________   HPI: Ernest Navarro. is a pleasant 62 y.o. male who presents today for left suprapatellar knee pain.  Patient underwent total knee arthroplasty in June 2023.  States he is always had some pain since surgery and had completed rehab.  He had left knee x-ray on 7/22, which showed minimal knee joint effusion which was improved from prior exam. He was seen by Dr. August Saucer on 11/08/2022, who felt knee pain related to quad tendinitis and referred to our clinic.  Patient states he started physical therapy approximately 2 weeks ago for his lumbar radiculopathy. Therapist has been helping with knee and provided home exercises. Unfortunately he missed his appointment yesterday as he was not feeling well.  He uses as needed  Tylenol and meloxicam.  Additionally started Ozempic for diabetes and weight loss, managed by PCP.  Pertinent ROS were reviewed with the patient and found to be negative unless otherwise specified above in HPI.   Assessment & Plan: Visit Diagnoses:  1. Tendinitis of left quadriceps tendon   2. S/P total knee arthroplasty, left    Plan: Primarily suspect quadriceps tendinitis.  ESWT performed today (see procedure note below).  Recommend repeat session in approximately 1 week.  Continue physical therapy and home exercise.  Continue Tylenol and meloxicam.  Will likely benefit from weight loss management, continue treatment with PCP.  Follow-up in 1 week.  Follow-up: Return in about 1 week (around 11/29/2022) for for L-quad pain .   Meds & Orders: No orders of the defined types were placed in this encounter.  No orders of the defined types were placed in this encounter.    Procedures: Procedure: ECSWT Indications:  Quadriceps Tendinopathy   Procedure Details Consent: Risks of procedure as well as the alternatives and risks of each were explained to the patient.  Verbal consent for procedure obtained. Time Out: Verified patient identification, verified procedure, site was marked, verified correct patient position. The area was cleaned with alcohol swab.     The left quadricep tendon was targeted for Extracorporeal shockwave therapy.    Preset: Quadriceps Tendinitis Power Level: 11 mJ Frequency: 12 Hz Impulse/cycles: 2800 Head size: Regular   Patient tolerated procedure well without immediate complications.         Clinical History: No specialty comments available.  He reports that he has never smoked. He has never used smokeless tobacco.  Recent Labs  04/05/22 0945 08/22/22 1630  HGBA1C 7.6* 8.4*    Objective:   Vital Signs: BP (!) 143/96   Pulse 90   Ht 6\' 1"  (1.854 m)   Wt (!) 315 lb (142.9 kg)   BMI 41.56 kg/m   Physical Exam  Gen: Well-appearing, in no acute  distress; non-toxic CV: Well-perfused. Warm.  Resp: Breathing unlabored on room air; no wheezing. Psych: Fluid speech in conversation; appropriate affect; normal thought process Neuro: Sensation intact throughout. No gross coordination deficits.   Ortho Exam -Left knee: No gross deformity, ecchymosis. Mild effusion.  TTP over insertion of quad tendon on patella. FROM.  5/5 strength and normal sensation.  Stable joint with anterior, posterior, valgus and varus stress.  Imaging: Narrative & Impression  CLINICAL DATA:  Chronic left knee pain.   EXAM: LEFT KNEE - 3 VIEW   COMPARISON:  09/19/2021   FINDINGS: Knee total arthroplasty in expected alignment. No periprosthetic lucency or fracture. Minimal knee joint effusion, although diminished from prior exam. Small rounded soft tissue calcifications adjacent to the lateral patella may be dystrophic. No erosive change   IMPRESSION: 1. Knee total arthroplasty without complication. 2. Minimal knee joint effusion, although diminished from prior exam.     Electronically Signed   By: Narda Rutherford M.D.   On: 10/30/2022 11:37    Past Medical/Family/Surgical/Social History: Medications & Allergies reviewed per EMR, new medications updated. 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   S/P total knee arthroplasty, right 10/28/2020   Type 2 diabetes mellitus without complication, without long-term current use of insulin (HCC) 05/15/2019   Hypotestosteronism 05/13/2019   Class 3 severe obesity due to excess calories with serious comorbidity and body mass index (BMI) of 40.0 to 44.9 in adult [facility]) 11/13/2018   Other male erectile dysfunction 05/25/2017   Lichen planus 05/27/2015   Obstructive sleep apnea syndrome 05/06/2015   Gout 06/19/2012   Benign essential hypertension 06/19/2012   Past Medical History:  Diagnosis Date   Arthritis    bilateral  knees   Class 3 severe obesity due to excess calories with serious comorbidity and body mass index (BMI) of 40.0 to 44.9 in adult [facility]) 11/13/2018   Essential hypertension, benign 06/19/2012   Obstructive sleep apnea syndrome 05/06/2015   Formatting of this note might be different from the original. Overview: High probability diagnosis based on previous reported sleep study and weight gain with witnessed apneas and loud snoring. We discussed the basic physiology and medical concerns. Plan-schedule sleep study, emphasize driving responsibility and advise weight loss Last Assessment & Plan: Formatting of this note might be different    Other male erectile dysfunction 05/25/2017   Sleep apnea    Type 2 diabetes mellitus without complication, without long-term current use of insulin (HCC) 05/15/2019   Family History  Problem Relation Age of Onset   Lymphoma Brother    Diabetes Maternal Grandmother    Colon cancer Neg Hx    Pancreatic cancer Neg Hx    Stomach cancer Neg Hx    Esophageal cancer Neg Hx    Liver disease Neg Hx    Past Surgical History:  Procedure Laterality Date   KNEE SURGERY Right    x3 arthroscopies   TOTAL KNEE ARTHROPLASTY Right 10/28/2020   Procedure: RIGHT TOTAL KNEE ARTHROPLASTY;  Surgeon: Cammy Copa, MD;  Location: [facility] OR;  Service: Orthopedics;  Laterality: Right;  TOTAL KNEE ARTHROPLASTY Left 09/05/2021   Procedure: LEFT TOTAL KNEE ARTHROPLASTY;  Surgeon: Cammy Copa, MD;  Location: [facility] OR;  Service: Orthopedics;  Laterality: Left;   Social History   Occupational History   Not on file  Tobacco Use   Smoking status: Never   Smokeless tobacco: Never  Vaping Use   Vaping status: Never Used  Substance and Sexual Activity   Alcohol use: No    Alcohol/week: 0.0 standard drinks of alcohol   Drug use: No   Sexual activity: Not on file

## 2022-11-22 NOTE — Progress Notes (Signed)
Quad tendonitis for 6 months Tried PT but still having pain Taking meloxicam and tramadol(as needed) and tylenol(this doesn't help much)

## 2022-11-23 ENCOUNTER — Ambulatory Visit: Payer: 59 | Admitting: Family Medicine

## 2022-11-23 ENCOUNTER — Encounter: Payer: 59 | Admitting: Rehabilitative and Restorative Service Providers"

## 2022-11-28 ENCOUNTER — Ambulatory Visit: Payer: 59 | Admitting: Rehabilitative and Restorative Service Providers"

## 2022-11-30 ENCOUNTER — Encounter: Payer: Self-pay | Admitting: Sports Medicine

## 2022-11-30 ENCOUNTER — Ambulatory Visit: Payer: 59 | Admitting: Sports Medicine

## 2022-11-30 ENCOUNTER — Ambulatory Visit: Payer: 59

## 2022-11-30 DIAGNOSIS — Z96652 Presence of left artificial knee joint: Secondary | ICD-10-CM

## 2022-11-30 DIAGNOSIS — M76892 Other specified enthesopathies of left lower limb, excluding foot: Secondary | ICD-10-CM

## 2022-11-30 NOTE — Progress Notes (Signed)
Follow-up for left quad pain. Patient feels that quad is improving after last shockwave treatment and would like to continue. Patient also mentioned that swelling seems to have gone down. Patient has been doing PT exercises at home but has not gone to in person appointments due to pain.

## 2022-11-30 NOTE — Progress Notes (Signed)
Ernest Navarro. - 62 y.o. male MRN [id number]  Date of birth: 1961-02-12  Office Visit Note: Visit Date: 11/30/2022 PCP: Suzan Slick, MD Referred by: Suzan Slick, MD  Subjective: Chief Complaint  Patient presents with   Left Leg - Follow-up, Pain   HPI: Ernest Navarro. is a pleasant 62 y.o. male who presents today for follow-up of left quadricep pain s/p L-TKA 09/20/21.  We performed our first trial of extracorporeal shockwave therapy last week, he noticed a definite improvement after this first treatment.  Feels like his motion improved and some of the swelling around the knee has also gone down.  He is continuing some of his home PT exercises and working on walking.  He is not requiring pain medication at this time, very infrequent Tylenol use.  Pertinent ROS were reviewed with the patient and found to be negative unless otherwise specified above in HPI.   Assessment & Plan: Visit Diagnoses:  1. Tendinitis of left quadriceps tendon   2. S/P total knee arthroplasty, left    Plan: Forward received a very positive response to his first trial of extracorporeal shockwave therapy for his quadricep tendinitis and resulting scar tissue from the TKA.  We did repeat treatment today, he tolerated well.  Given his home knee and quadricep home exercises as well as walking.  He may use Tylenol as needed and ice for post activity.  Follow-up next week for repeat shockwave and evaluation.  Follow-up: Return in about 1 week (around 12/07/2022) for For L-quad (reg visit).   Meds & Orders: No orders of the defined types were placed in this encounter.  No orders of the defined types were placed in this encounter.    Procedures: Procedure: ECSWT Indications:  Quadriceps Tendinopathy   Procedure Details Consent: Risks of procedure as well as the alternatives and risks of each were explained to the patient.  Verbal consent for procedure obtained. Time Out: Verified patient  identification, verified procedure, site was marked, verified correct patient position. The area was cleaned with alcohol swab.     The left quadricep tendon was targeted for Extracorporeal shockwave therapy.    Preset: Quadriceps Tendinitis Power Level: 12 mJ   Frequency: 13 Hz Impulse/cycles: 3000 Head size: Regular   Patient tolerated procedure well without immediate complications.      Clinical History: No specialty comments available.  He reports that he has never smoked. He has never used smokeless tobacco.  Recent Labs    04/05/22 0945 08/22/22 1630  HGBA1C 7.6* 8.4*    Objective:   Vital Signs: There were no vitals taken for this visit.  Physical Exam  Gen: Well-appearing, in no acute distress; non-toxic CV:  Well-perfused. Warm.  Resp: Breathing unlabored on room air; no wheezing. Psych: Fluid speech in conversation; appropriate affect; normal thought process Neuro: Sensation intact throughout. No gross coordination deficits.   Ortho Exam - Left knee: There is a well-healed incision from prior knee TKA.  There is a trace effusion in the suprapatellar pouch.  No tenderness to palpation.  There is 5/5 strength with knee flexion and extension.  No instability noted.  Imaging: No results found.  Past Medical/Family/Surgical/Social History: Medications & Allergies reviewed per EMR, new medications updated. 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   S/P total knee arthroplasty, right 10/28/2020   Type 2 diabetes mellitus without complication, without  long-term current use of insulin (HCC) 05/15/2019   Hypotestosteronism 05/13/2019   Class 3 severe obesity due to excess calories with serious comorbidity and body mass index (BMI) of 40.0 to 44.9 in adult [facility]) 11/13/2018   Other male erectile dysfunction 05/25/2017   Lichen planus 05/27/2015   Obstructive sleep apnea syndrome  05/06/2015   Gout 06/19/2012   Benign essential hypertension 06/19/2012   Past Medical History:  Diagnosis Date   Arthritis    bilateral knees   Class 3 severe obesity due to excess calories with serious comorbidity and body mass index (BMI) of 40.0 to 44.9 in adult [facility]) 11/13/2018   Essential hypertension, benign 06/19/2012   Obstructive sleep apnea syndrome 05/06/2015   Formatting of this note might be different from the original. Overview: High probability diagnosis based on previous reported sleep study and weight gain with witnessed apneas and loud snoring. We discussed the basic physiology and medical concerns. Plan-schedule sleep study, emphasize driving responsibility and advise weight loss Last Assessment & Plan: Formatting of this note might be different    Other male erectile dysfunction 05/25/2017   Sleep apnea    Type 2 diabetes mellitus without complication, without long-term current use of insulin (HCC) 05/15/2019   Family History  Problem Relation Age of Onset   Lymphoma Brother    Diabetes Maternal Grandmother    Colon cancer Neg Hx    Pancreatic cancer Neg Hx    Stomach cancer Neg Hx    Esophageal cancer Neg Hx    Liver disease Neg Hx    Past Surgical History:  Procedure Laterality Date   KNEE SURGERY Right    x3 arthroscopies   TOTAL KNEE ARTHROPLASTY Right 10/28/2020   Procedure: RIGHT TOTAL KNEE ARTHROPLASTY;  Surgeon: Cammy Copa, MD;  Location: [facility] OR;  Service: Orthopedics;  Laterality: Right;   TOTAL KNEE ARTHROPLASTY Left 09/05/2021   Procedure: LEFT TOTAL KNEE ARTHROPLASTY;  Surgeon: Cammy Copa, MD;  Location: [facility] OR;  Service: Orthopedics;  Laterality: Left;   Social History   Occupational History   Not on file  Tobacco Use   Smoking status: Never   Smokeless tobacco: Never  Vaping Use   Vaping status: Never Used  Substance and Sexual Activity   Alcohol use: No    Alcohol/week: 0.0 standard drinks of alcohol   Drug use: No    Sexual activity: Not on file

## 2022-12-05 ENCOUNTER — Encounter: Payer: 59 | Admitting: Rehabilitative and Restorative Service Providers"

## 2022-12-05 ENCOUNTER — Encounter: Payer: Self-pay | Admitting: Pharmacist

## 2022-12-06 ENCOUNTER — Ambulatory Visit: Payer: 59 | Admitting: Family Medicine

## 2022-12-07 ENCOUNTER — Encounter: Payer: 59 | Admitting: Rehabilitative and Restorative Service Providers"

## 2022-12-08 ENCOUNTER — Encounter: Payer: Self-pay | Admitting: Pharmacist

## 2022-12-08 ENCOUNTER — Encounter: Payer: Self-pay | Admitting: Sports Medicine

## 2022-12-08 ENCOUNTER — Encounter: Payer: 59 | Admitting: Physical Medicine & Rehabilitation

## 2022-12-08 ENCOUNTER — Ambulatory Visit: Payer: 59 | Admitting: Sports Medicine

## 2022-12-08 DIAGNOSIS — Z96652 Presence of left artificial knee joint: Secondary | ICD-10-CM

## 2022-12-08 DIAGNOSIS — M76892 Other specified enthesopathies of left lower limb, excluding foot: Secondary | ICD-10-CM

## 2022-12-08 NOTE — Progress Notes (Signed)
Ernest Navarro. - 62 y.o. male MRN [id number]  Date of birth: [birth year]  Office Visit Note: Visit Date: 12/08/2022 PCP: Suzan Slick, MD Referred by: Suzan Slick, MD  Subjective: Chief Complaint  Patient presents with   Left Leg - Pain, Follow-up   HPI: Ernest Navarro. is a pleasant 62 y.o. male who presents today for  follow-up of left quadricep pain s/p L-TKA 09/20/21.   We have performed 2 treatments of extracorporeal shockwave therapy, he has noticed improvement with each of these.  He also is progressing in his home rehab exercises.  Overdid it just a touch the other day and had some mild swelling but overall feels more stable and less pain.   Pertinent ROS were reviewed with the patient and found to be negative unless otherwise specified above in HPI.   Assessment & Plan: Visit Diagnoses:  1. Tendinitis of left quadriceps tendon   2. S/P total knee arthroplasty, left    Plan: Ernest Navarro is making good progress in his quadricep tendinitis and knee pain status post his prior TKA with extracorporeal shockwave therapy and progressing on his own with home exercise rehab.  His exam shows less swelling and he is progressing in his activity level.  We did repeat ESWT today, he tolerated well.  Discussed progressing and advancing his HEP as his pain allows, he will continue these daily, may take off 1 day weekly for recovery.  He will follow-up next week for repeat shockwave treatment and further evaluation.  Follow-up: Return in about 1 week (around 12/15/2022) for for L-quad (reg visit).   Meds & Orders: No orders of the defined types were placed in this encounter.  No orders of the defined types were placed in this encounter.    Procedures: Procedure: ECSWT Indications:  Quadriceps Tendinopathy   Procedure Details Consent: Risks of procedure as well as the alternatives and risks of each were explained to the patient.  Verbal consent for procedure obtained. Time  Out: Verified patient identification, verified procedure, site was marked, verified correct patient position. The area was cleaned with alcohol swab.     The left quadricep tendon was targeted for Extracorporeal shockwave therapy.    Preset: Quadriceps Tendinitis Power Level: 12 mJ   Frequency: 14 Hz Impulse/cycles: 3200 Head size: Regular   Patient tolerated procedure well without immediate complications.        Clinical History: No specialty comments available.  He reports that he has never smoked. He has never used smokeless tobacco.  Recent Labs    04/05/22 0945 08/22/22 1630  HGBA1C 7.6* 8.4*    Objective:    Physical Exam  Gen: Well-appearing, in no acute distress; non-toxic CV: Well-perfused. Warm.  Resp: Breathing unlabored on room air; no wheezing. Psych: Fluid speech in conversation; appropriate affect; normal thought process Neuro: Sensation intact throughout. No gross coordination deficits.   Ortho Exam - Left knee: Well-healed incision from prior knee TKA without signs of infection.  There is a trace effusion in the suprapatellar pouch but improved swelling from previous visits.  There is 5/5 strength with knee flexion and extension, no instability noted.  No tenderness palpating over the quadricep tendon today.  Imaging: No results found.  Past Medical/Family/Surgical/Social History: Medications & Allergies reviewed per EMR, new medications updated. 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   S/P total knee arthroplasty, right 10/28/2020  Type 2 diabetes mellitus without complication, without long-term current use of insulin (HCC) 05/15/2019   Hypotestosteronism 05/13/2019   Class 3 severe obesity due to excess calories with serious comorbidity and body mass index (BMI) of 40.0 to 44.9 in adult [facility]) 11/13/2018   Other male erectile dysfunction 05/25/2017   Lichen planus  05/27/2015   Obstructive sleep apnea syndrome 05/06/2015   Gout 06/19/2012   Benign essential hypertension 06/19/2012   Past Medical History:  Diagnosis Date   Arthritis    bilateral knees   Class 3 severe obesity due to excess calories with serious comorbidity and body mass index (BMI) of 40.0 to 44.9 in adult [facility]) 11/13/2018   Essential hypertension, benign 06/19/2012   Obstructive sleep apnea syndrome 05/06/2015   Formatting of this note might be different from the original. Overview: High probability diagnosis based on previous reported sleep study and weight gain with witnessed apneas and loud snoring. We discussed the basic physiology and medical concerns. Plan-schedule sleep study, emphasize driving responsibility and advise weight loss Last Assessment & Plan: Formatting of this note might be different    Other male erectile dysfunction 05/25/2017   Sleep apnea    Type 2 diabetes mellitus without complication, without long-term current use of insulin (HCC) 05/15/2019   Family History  Problem Relation Age of Onset   Lymphoma Brother    Diabetes Maternal Grandmother    Colon cancer Neg Hx    Pancreatic cancer Neg Hx    Stomach cancer Neg Hx    Esophageal cancer Neg Hx    Liver disease Neg Hx    Past Surgical History:  Procedure Laterality Date   KNEE SURGERY Right    x3 arthroscopies   TOTAL KNEE ARTHROPLASTY Right 10/28/2020   Procedure: RIGHT TOTAL KNEE ARTHROPLASTY;  Surgeon: Cammy Copa, MD;  Location: [facility] OR;  Service: Orthopedics;  Laterality: Right;   TOTAL KNEE ARTHROPLASTY Left 09/05/2021   Procedure: LEFT TOTAL KNEE ARTHROPLASTY;  Surgeon: Cammy Copa, MD;  Location: [facility] OR;  Service: Orthopedics;  Laterality: Left;   Social History   Occupational History   Not on file  Tobacco Use   Smoking status: Never   Smokeless tobacco: Never  Vaping Use   Vaping status: Never Used  Substance and Sexual Activity   Alcohol use: No    Alcohol/week: 0.0  standard drinks of alcohol   Drug use: No   Sexual activity: Not on file

## 2022-12-08 NOTE — Progress Notes (Signed)
Patient states that he thinks this process is helping. He says that when he does a lot of activity in one day it makes the leg a little irritated but overall feels better. Patient has not taken any tylenol but is doing home exercises.

## 2022-12-12 ENCOUNTER — Encounter: Payer: 59 | Admitting: Physical Medicine & Rehabilitation

## 2022-12-13 ENCOUNTER — Ambulatory Visit: Payer: 59 | Admitting: Family Medicine

## 2022-12-15 ENCOUNTER — Encounter: Payer: Self-pay | Admitting: Sports Medicine

## 2022-12-15 ENCOUNTER — Encounter: Payer: Self-pay | Admitting: Pharmacist

## 2022-12-15 ENCOUNTER — Ambulatory Visit: Payer: 59 | Admitting: Sports Medicine

## 2022-12-15 DIAGNOSIS — G8929 Other chronic pain: Secondary | ICD-10-CM

## 2022-12-15 DIAGNOSIS — M76892 Other specified enthesopathies of left lower limb, excluding foot: Secondary | ICD-10-CM | POA: Diagnosis not present

## 2022-12-15 DIAGNOSIS — M25562 Pain in left knee: Secondary | ICD-10-CM

## 2022-12-15 DIAGNOSIS — Z96652 Presence of left artificial knee joint: Secondary | ICD-10-CM | POA: Diagnosis not present

## 2022-12-15 NOTE — Progress Notes (Signed)
Ernest Navarro. - 62 y.o. male MRN [id number]  Date of birth: April 16, 1960  Office Visit Note: Visit Date: 12/15/2022 PCP: Suzan Slick, MD Referred by: Suzan Slick, MD  Subjective: Chief Complaint  Patient presents with   Left Knee - Pain, Follow-up   HPI: Ernest Booton. is a pleasant 62 y.o. male who presents today for left quadricep pain s/p L-TKA 09/20/21.    We have performed 3 treatments of extracorporeal shockwave therapy, with the first 2 sessions he noticed quite a bit of improvement in both pain and function.  After this last treatment he had some increased soreness for the next 4 days or so.  90s having pain over the quad but somewhat lower over the knee joint as well.  He has noticed some increasing swelling in the leg as well.  Pertinent ROS were reviewed with the patient and found to be negative unless otherwise specified above in HPI.   Assessment & Plan: Visit Diagnoses:  1. Chronic pain of left knee   2. Tendinitis of left quadriceps tendon   3. S/P total knee arthroplasty, left    Plan: Discussed with Ernest Navarro treatment options for his chronic left knee pain status post knee replacement.  He has been making good improvements with extracorporeal shockwave therapy with his quadricep tendinitis and resulting scar tissue, this is feeling well but his knee in general is slightly flared up and he has been increased swelling in the leg which she has had in the past.  At this point we will hold from further shockwave treatments.  I would like to get him back into formalized physical therapy, he was doing this at the [place] location.  He will call today to restart this.  I think some of his pain is coming from the soft tissue swelling, did recommend leg compression stockings/socks, did print these out and give him Fleet feet locations for him for these.  He will get started back in PT and follow-up about 3 weeks after starting this for reevaluation.  Could  consider repeat shockwave in the future but will hold for now.  Okay to take over-the-counter anti-inflammatories and perform his home exercises as long as this does not exacerbate his pain.  Follow-up: Return if symptoms worsen or fail to improve, for f/u about 3 weeks after starting PT.   Meds & Orders: No orders of the defined types were placed in this encounter.  No orders of the defined types were placed in this encounter.    Procedures: No procedures performed      Clinical History: No specialty comments available.  He reports that he has never smoked. He has never used smokeless tobacco.  Recent Labs    04/05/22 0945 08/22/22 1630  HGBA1C 7.6* 8.4*    Objective:    Physical Exam  Gen: Well-appearing, in no acute distress; non-toxic CV:  Well-perfused. Warm.  Resp: Breathing unlabored on room air; no wheezing. Psych: Fluid speech in conversation; appropriate affect; normal thought process Neuro: Sensation intact throughout. No gross coordination deficits.   Ortho Exam - LLE/knee: There is some very mild TTP over the distal quadriceps insertion and some generalized pain over the anterior aspect of the knee joint.  Well-healed incision from prior knee TKA.  He has 1+ pitting edema from the knee down to the ankle.  No pain over the popliteal fossa, negative Homans.  - Left calf circumference: 17 "  Imaging: No results found.  Past Medical/Family/Surgical/Social History:  Medications & Allergies reviewed per EMR, new medications updated. 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   S/P total knee arthroplasty, right 10/28/2020   Type 2 diabetes mellitus without complication, without long-term current use of insulin (HCC) 05/15/2019   Hypotestosteronism 05/13/2019   Class 3 severe obesity due to excess calories with serious comorbidity and body mass index (BMI) of 40.0 to 44.9 in adult  [facility]) 11/13/2018   Other male erectile dysfunction 05/25/2017   Lichen planus 05/27/2015   Obstructive sleep apnea syndrome 05/06/2015   Gout 06/19/2012   Benign essential hypertension 06/19/2012   Past Medical History:  Diagnosis Date   Arthritis    bilateral knees   Class 3 severe obesity due to excess calories with serious comorbidity and body mass index (BMI) of 40.0 to 44.9 in adult [facility]) 11/13/2018   Essential hypertension, benign 06/19/2012   Obstructive sleep apnea syndrome 05/06/2015   Formatting of this note might be different from the original. Overview: High probability diagnosis based on previous reported sleep study and weight gain with witnessed apneas and loud snoring. We discussed the basic physiology and medical concerns. Plan-schedule sleep study, emphasize driving responsibility and advise weight loss Last Assessment & Plan: Formatting of this note might be different    Other male erectile dysfunction 05/25/2017   Sleep apnea    Type 2 diabetes mellitus without complication, without long-term current use of insulin (HCC) 05/15/2019   Family History  Problem Relation Age of Onset   Lymphoma Brother    Diabetes Maternal Grandmother    Colon cancer Neg Hx    Pancreatic cancer Neg Hx    Stomach cancer Neg Hx    Esophageal cancer Neg Hx    Liver disease Neg Hx    Past Surgical History:  Procedure Laterality Date   KNEE SURGERY Right    x3 arthroscopies   TOTAL KNEE ARTHROPLASTY Right 10/28/2020   Procedure: RIGHT TOTAL KNEE ARTHROPLASTY;  Surgeon: Cammy Copa, MD;  Location: [facility] OR;  Service: Orthopedics;  Laterality: Right;   TOTAL KNEE ARTHROPLASTY Left 09/05/2021   Procedure: LEFT TOTAL KNEE ARTHROPLASTY;  Surgeon: Cammy Copa, MD;  Location: [facility] OR;  Service: Orthopedics;  Laterality: Left;   Social History   Occupational History   Not on file  Tobacco Use   Smoking status: Never   Smokeless tobacco: Never  Vaping Use   Vaping status: Never  Used  Substance and Sexual Activity   Alcohol use: No    Alcohol/week: 0.0 standard drinks of alcohol   Drug use: No   Sexual activity: Not on file

## 2022-12-15 NOTE — Progress Notes (Signed)
Patient states that after the last shockwave treatment it took nearly the full week to recover - today he feels that he is back to where he was at his last visit. He says that as he does not feel like he has progressed he may want to take a few weeks off of the treatment and go back to physical therapy. Patient was unable to complete home exercises this week due to pain.

## 2022-12-19 ENCOUNTER — Encounter: Payer: 59 | Admitting: Physical Medicine & Rehabilitation

## 2022-12-21 ENCOUNTER — Ambulatory Visit: Payer: 59 | Admitting: Family Medicine

## 2023-01-03 ENCOUNTER — Ambulatory Visit: Payer: 59 | Attending: Family Medicine | Admitting: Physical Therapy

## 2023-01-03 ENCOUNTER — Encounter: Payer: Self-pay | Admitting: Physical Therapy

## 2023-01-03 DIAGNOSIS — M6281 Muscle weakness (generalized): Secondary | ICD-10-CM | POA: Insufficient documentation

## 2023-01-03 DIAGNOSIS — M25562 Pain in left knee: Secondary | ICD-10-CM | POA: Diagnosis present

## 2023-01-03 DIAGNOSIS — M25662 Stiffness of left knee, not elsewhere classified: Secondary | ICD-10-CM | POA: Insufficient documentation

## 2023-01-03 DIAGNOSIS — R262 Difficulty in walking, not elsewhere classified: Secondary | ICD-10-CM | POA: Diagnosis present

## 2023-01-03 NOTE — Therapy (Signed)
OUTPATIENT PHYSICAL THERAPY THORACOLUMBAR TREATMENT AND RECERTIFICATION   Patient Name: Ernest Navarro. MRN: [id number] DOB:July 19, 1960, 62 y.o., male Today's Date: 01/03/2023  END OF SESSION:  PT End of Session - 01/03/23 1608     Visit Number 3    Number of Visits 15    Date for PT Re-Evaluation 02/14/23    Authorization Type [employer]    PT Start Time 1530    PT Stop Time 1608    PT Time Calculation (min) 38 min    Activity Tolerance Patient tolerated treatment well    Behavior During Therapy WFL for tasks assessed/performed              Past Medical History:  Diagnosis Date   Arthritis    bilateral knees   Class 3 severe obesity due to excess calories with serious comorbidity and body mass index (BMI) of 40.0 to 44.9 in adult (HCC) 11/13/2018   Essential hypertension, benign 06/19/2012   Obstructive sleep apnea syndrome 05/06/2015   Formatting of this note might be different from the original. Overview: High probability diagnosis based on previous reported sleep study and weight gain with witnessed apneas and loud snoring. We discussed the basic physiology and medical concerns. Plan-schedule sleep study, emphasize driving responsibility and advise weight loss Last Assessment & Plan: Formatting of this note might be different    Other male erectile dysfunction 05/25/2017   Sleep apnea    Type 2 diabetes mellitus without complication, without long-term current use of insulin (HCC) 05/15/2019   Past Surgical History:  Procedure Laterality Date   KNEE SURGERY Right    x3 arthroscopies   TOTAL KNEE ARTHROPLASTY Right 10/28/2020   Procedure: RIGHT TOTAL KNEE ARTHROPLASTY;  Surgeon: Cammy Copa, MD;  Location: [facility] OR;  Service: Orthopedics;  Laterality: Right;   TOTAL KNEE ARTHROPLASTY Left 09/05/2021   Procedure: LEFT TOTAL KNEE ARTHROPLASTY;  Surgeon: Cammy Copa, MD;  Location: [facility] OR;  Service: Orthopedics;  Laterality: Left;   Patient Active Problem List    Diagnosis Date Noted   Arthritis of left knee    S/P total knee arthroplasty, left 09/05/2021   S/P total knee arthroplasty, right 10/28/2020   Type 2 diabetes mellitus without complication, without long-term current use of insulin (HCC) 05/15/2019   Hypotestosteronism 05/13/2019   Class 3 severe obesity due to excess calories with serious comorbidity and body mass index (BMI) of 40.0 to 44.9 in adult (HCC) 11/13/2018   Other male erectile dysfunction 05/25/2017   Lichen planus 05/27/2015   Obstructive sleep apnea syndrome 05/06/2015   Gout 06/19/2012   Benign essential hypertension 06/19/2012    PCP: Suzan Slick, MD  REFERRING PROVIDER: Suzan Slick, MD  REFERRING DIAG: M54.50,M79.605 (ICD-10-CM) - Lumbar pain with radiation down left leg M54.32 (ICD-10-CM) - Sciatica of left side  Rationale for Evaluation and Treatment: Rehabilitation  THERAPY DIAG:  Muscle weakness (generalized)  Difficulty in walking, not elsewhere classified  Acute pain of left knee  Stiffness of left knee, not elsewhere classified  ONSET DATE: End of 2023  SUBJECTIVE:  SUBJECTIVE STATEMENT: Pt returns after prolonged absence due to having multiple bouts of shockwave therapy for his knee. He feels he now has some tendonitis in his knee and knee cap. He is not having back pain anymore. He still reports he has occasional swelling in his knee  PERTINENT HISTORY:  R TKA in 2022, L TKA in 2023 From eval: Pt states he had a R TKA in 2022. Did L TKA summer of 2023. Pt reports he was getting swelling in his L knee and radiating pain into the left leg. Pt got imaging performed which demonstrated disc space loss at L5-S1. Pt states pain radiates from L hip down to leg. Pt states pain wakes him up in the middle of the  night. Pt states pain is constant. Pt states side of his knee is a little numb but this may be due to the surgery. Does state knee buckles at times  PAIN:  Are you having pain? Yes: NPRS scale: 2 in knee currently, 5/10 at worst/10 Pain location: L knee Pain description: throbbing/aching Aggravating factors: Walking (<5 min), sitting (5-10 min) Relieving factors: medication (to a certain degree), ice/heat   PRECAUTIONS: None  RED FLAGS: None   WEIGHT BEARING RESTRICTIONS: No  FALLS:  Has patient fallen in last 6 months? 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  Left knee x-ray 10/23/22 results pending  PATIENT SURVEYS:  FOTO 40; predicted 54     SENSATION: N/T L hip to lateral leg  MUSCLE LENGTH: Hamstrings: Right 80 deg; Left 70 deg Thomas test: Right 10 deg; Left 10 deg (pulls on L low back)  POSTURE: posterior pelvic tilt  PALPATION: TTP L piriformis and bilat QL  LUMBAR ROM:   AROM eval  Flexion 90% pain coming up  Extension 100% *  Right lateral flexion 100%  Left lateral flexion 100%  Right rotation 90% increases s/s  Left rotation 90% increases s/s   (Blank rows = not tested)  LOWER EXTREMITY ROM:     Active  Right eval Left eval  Hip flexion    Hip extension    Hip abduction    Hip adduction    Hip internal rotation    Hip external rotation    Knee flexion    Knee extension    Ankle dorsiflexion    Ankle plantarflexion    Ankle inversion    Ankle eversion     (Blank rows = not tested)  LOWER EXTREMITY MMT:    MMT  Right eval Left eval Left 01/03/23  Hip flexion 4+ 4+ 4+ SLR with ER 4-/5  Hip extension 3+ 3+ 4-  Hip abduction 4+ 3+ 4-  Hip adduction     Hip internal rotation     Hip external rotation     Knee flexion 4 4- 4-  Knee extension 5 5 4   Ankle dorsiflexion     Ankle plantarflexion     Ankle inversion     Ankle eversion      (Blank rows = not tested)  LUMBAR SPECIAL TESTS:  Straight leg raise test: Positive, Slump test: Negative,  Single leg stance test: Positive, FABER test: Positive, and Thomas test: Positive  FUNCTIONAL TESTS:  5 times sit to stand: 15.26 sec SLS: 5 sec on R, 4 sec on L  GAIT: WNL   [facility] Adult PT Treatment:                                                DATE: 01/03/23 Therapeutic Exercise: MMT (see above) 5 x STS = 16.38 seconds SLS 6 Lt, 3 Rt LAQ green TB 2 x 10 HS curl green TB 2 x 10 SLR with ER x 10 Sidelying Hip abd x 10 Sidelying clam green TB x 10  Therapeutic Activity: TTP Lt ITB, multiple trigger points palpable  Self Care: Self ITB massage with rolling pin    PATIENT EDUCATION:  Education details: Exam findings, POC, initial HEP Person educated: Patient Education method: Explanation, Demonstration, and Handouts Education comprehension: verbalized understanding, returned demonstration, and needs further education  HOME EXERCISE PROGRAM: Access Code: [email or web address] URL: [email or web address] Date: 01/03/2023 Prepared by: Reggy Eye  Exercises - Sitting Knee Extension with Resistance  - 1 x daily - 7 x weekly - 3 sets - 10 reps - Seated Hamstring Curl with Anchored Resistance  - 1 x daily - 7 x weekly - 3 sets - 10 reps - Single Leg Stance with Support  - 1 x daily - 7 x weekly - 1 sets - 10 reps - 10 seconds hold - Straight Leg Raise with External Rotation  - 1 x daily - 7 x weekly - 2 sets - 10 reps - Sidelying Hip Abduction  - 1 x daily - 7 x weekly - 2 sets - 10 reps - Clamshell with Resistance  - 1 x daily - 7 x  weekly - 2 sets - 10 reps  ASSESSMENT:  CLINICAL IMPRESSION: Pt presents with decreased hip and knee strength and continues with decreased strength and balance. He will continue to benefit from skilled PT to address deficits and improve functional mobility  From eval: Patient is a 62 y.o. M who was seen today for physical therapy evaluation and treatment for L lumbar radiculopathy. Assessment significant for L>R glute weakness, core weakness, piriformis tightness with radiating symptoms from L hip to lateral L leg. Increased symptom centralization when pt performed prone press up. Pt would benefit from PT to address these deficits for improved overall function with work and community tasks. Pt states he will be on vacation next week -- will start PT when he returns.      GOALS: Goals reviewed with patient? Yes  SHORT TERM GOALS: Target date: 11/30/2022   Pt will be ind with initial HEP Baseline: Goal status: IN PROGRESS  2.  Pt will demo 5x STS of </=13 sec for increased functional LE strength Baseline:  Goal status: IN PROGRESS   LONG TERM GOALS: Target date: 02/14/2023    Pt will be ind with progression and management of HEP Baseline:  Goal status: INITIAL  2.  Pt will be able to walk at least 20 min with pain </=3/10 Baseline: 3-4 minutes Goal status: IN PROGRESS  3.  Pt will be able to sit to perform desk/work functions for at least 45 min with pain </=3/10 Baseline:  Goal status: MET  4.  Pt will be able to perform SLS for at least 10 sec on R &  L LE to demo improved LE/core stability in standing Baseline: 6 Lt LE, 3 Rt LE Goal status: IN PROGRESS  5.  Pt will have improved FOTO to >/=54 Baseline:  Goal status: INITIAL   PLAN:  PT FREQUENCY: 2x/week (starting after next week when pt returns from vacation)  PT DURATION: 6 weeks  PLANNED INTERVENTIONS: Therapeutic exercises, Therapeutic activity, Neuromuscular re-education, Balance training, Gait training,  Patient/Family education, Self Care, Joint mobilization, Stair training, Aquatic Therapy, Dry Needling, Electrical stimulation, Spinal mobilization, Cryotherapy, Moist heat, Taping, Vasopneumatic device, Ionotophoresis 4mg /ml Dexamethasone, Manual therapy, and Re-evaluation.  PLAN FOR NEXT SESSION:knee strength, balance   Lucienne Sawyers, PT 01/03/2023, 4:09 PM

## 2023-01-05 ENCOUNTER — Encounter: Payer: Self-pay | Admitting: Physical Therapy

## 2023-01-05 ENCOUNTER — Ambulatory Visit: Payer: 59 | Admitting: Physical Therapy

## 2023-01-05 DIAGNOSIS — M6281 Muscle weakness (generalized): Secondary | ICD-10-CM | POA: Diagnosis not present

## 2023-01-05 DIAGNOSIS — R262 Difficulty in walking, not elsewhere classified: Secondary | ICD-10-CM

## 2023-01-05 DIAGNOSIS — M25562 Pain in left knee: Secondary | ICD-10-CM

## 2023-01-05 NOTE — Therapy (Signed)
OUTPATIENT PHYSICAL THERAPY TREATMENT   Patient Name: Ernest Navarro. MRN: [id number] DOB:[birth year], 62 y.o., male Today's Date: 01/05/2023  END OF SESSION:  PT End of Session - 01/05/23 0832     Visit Number 4    Number of Visits 15    Date for PT Re-Evaluation 02/14/23    Authorization Type [employer]    PT Start Time 0800    PT Stop Time 0842    PT Time Calculation (min) 42 min    Activity Tolerance Patient tolerated treatment well    Behavior During Therapy WFL for tasks assessed/performed               Past Medical History:  Diagnosis Date   Arthritis    bilateral knees   Class 3 severe obesity due to excess calories with serious comorbidity and body mass index (BMI) of 40.0 to 44.9 in adult (HCC) 11/13/2018   Essential hypertension, benign 06/19/2012   Obstructive sleep apnea syndrome 05/06/2015   Formatting of this note might be different from the original. Overview: High probability diagnosis based on previous reported sleep study and weight gain with witnessed apneas and loud snoring. We discussed the basic physiology and medical concerns. Plan-schedule sleep study, emphasize driving responsibility and advise weight loss Last Assessment & Plan: Formatting of this note might be different    Other male erectile dysfunction 05/25/2017   Sleep apnea    Type 2 diabetes mellitus without complication, without long-term current use of insulin (HCC) 05/15/2019   Past Surgical History:  Procedure Laterality Date   KNEE SURGERY Right    x3 arthroscopies   TOTAL KNEE ARTHROPLASTY Right 10/28/2020   Procedure: RIGHT TOTAL KNEE ARTHROPLASTY;  Surgeon: Cammy Copa, MD;  Location: [facility] OR;  Service: Orthopedics;  Laterality: Right;   TOTAL KNEE ARTHROPLASTY Left 09/05/2021   Procedure: LEFT TOTAL KNEE ARTHROPLASTY;  Surgeon: Cammy Copa, MD;  Location: [facility] OR;  Service: Orthopedics;  Laterality: Left;   Patient Active Problem List   Diagnosis Date Noted   Arthritis  of left knee    S/P total knee arthroplasty, left 09/05/2021   S/P total knee arthroplasty, right 10/28/2020   Type 2 diabetes mellitus without complication, without long-term current use of insulin (HCC) 05/15/2019   Hypotestosteronism 05/13/2019   Class 3 severe obesity due to excess calories with serious comorbidity and body mass index (BMI) of 40.0 to 44.9 in adult (HCC) 11/13/2018   Other male erectile dysfunction 05/25/2017   Lichen planus 05/27/2015   Obstructive sleep apnea syndrome 05/06/2015   Gout 06/19/2012   Benign essential hypertension 06/19/2012    PCP: Suzan Slick, MD  REFERRING PROVIDER: Suzan Slick, MD  REFERRING DIAG: M54.50,M79.605 (ICD-10-CM) - Lumbar pain with radiation down left leg M54.32 (ICD-10-CM) - Sciatica of left side  Rationale for Evaluation and Treatment: Rehabilitation  THERAPY DIAG:  Muscle weakness (generalized)  Difficulty in walking, not elsewhere classified  Acute pain of left knee  ONSET DATE: End of 2023  SUBJECTIVE:  SUBJECTIVE STATEMENT: Pt states he is feeling "ok". He has continued to take tylenol for knee pain  PERTINENT HISTORY:  R TKA in 2022, L TKA in 2023 From eval: Pt states he had a R TKA in 2022. Did L TKA summer of 2023. Pt reports he was getting swelling in his L knee and radiating pain into the left leg. Pt got imaging performed which demonstrated disc space loss at L5-S1. Pt states pain radiates from L hip down to leg. Pt states pain wakes him up in the middle of the night. Pt states pain is constant. Pt states side of his knee is a little numb but this may be due to the surgery. Does state knee buckles at times  PAIN:  Are you having pain? Yes: NPRS scale: 1 in knee currently, 5/10 at worst/10 Pain location: L knee Pain  description: throbbing/aching Aggravating factors: Walking (<5 min), sitting (5-10 min) Relieving factors: medication (to a certain degree), ice/heat   PRECAUTIONS: None  RED FLAGS: None   WEIGHT BEARING RESTRICTIONS: No  FALLS:  Has patient fallen in last 6 months? 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  Left knee x-ray 10/23/22 results pending  PATIENT SURVEYS:  FOTO 40; predicted 54     SENSATION: N/T L hip to lateral leg  MUSCLE LENGTH: Hamstrings: Right 80 deg; Left 70 deg Thomas test: Right 10 deg; Left 10 deg (pulls on L low back)  POSTURE: posterior pelvic tilt  PALPATION: TTP L piriformis and bilat QL  LUMBAR ROM:   AROM eval  Flexion 90% pain coming up  Extension 100% *  Right lateral flexion 100%  Left lateral flexion 100%  Right rotation 90% increases s/s  Left rotation 90% increases s/s   (Blank rows = not tested)  LOWER EXTREMITY ROM:     Active  Right eval Left eval  Hip flexion    Hip extension    Hip abduction    Hip adduction    Hip internal rotation    Hip external rotation    Knee flexion    Knee extension    Ankle dorsiflexion    Ankle plantarflexion    Ankle inversion    Ankle eversion     (Blank rows = not tested)  LOWER EXTREMITY MMT:    MMT Right eval Left eval Left 01/03/23  Hip flexion 4+ 4+ 4+ SLR with ER 4-/5  Hip extension 3+ 3+ 4-  Hip abduction 4+ 3+ 4-  Hip adduction     Hip internal rotation     Hip external rotation     Knee flexion 4 4- 4-  Knee extension 5 5 4   Ankle  dorsiflexion     Ankle plantarflexion     Ankle inversion     Ankle eversion      (Blank rows = not tested)  LUMBAR SPECIAL TESTS:  Straight leg raise test: Positive, Slump test: Negative, Single leg stance test: Positive, FABER test: Positive, and Thomas test: Positive  FUNCTIONAL TESTS:  5 times sit to stand: 15.26 sec SLS: 5 sec on R, 4 sec on  L  GAIT: WNL  [facility] Adult PT Treatment:                                                DATE: 01/05/23 Therapeutic Exercise: Nustep L5 x 5 min for warm up LAQ green TB 2 x 10 HS curl green TB 2 x 10 Sidelying hip abd 2 x 10 SLR with ER 2 x 10 Supine clam green TB 2 x 10 Tandem stance on ground 2 x 30 seconds, tandem stance on foam 2 x 30 seconds Rocker board A/P and laterally x 1 min each  Modalities: Vaso Lt knee x 10 min low pressure 34 degrees   [facility] Adult PT Treatment:                                                DATE: 01/03/23 Therapeutic Exercise: MMT (see above) 5 x STS = 16.38 seconds SLS 6 Lt, 3 Rt LAQ green TB 2 x 10 HS curl green TB 2 x 10 SLR with ER x 10 Sidelying Hip abd x 10 Sidelying clam green TB x 10  Therapeutic Activity: TTP Lt ITB, multiple trigger points palpable  Self Care: Self ITB massage with rolling pin    PATIENT EDUCATION:  Education details: Exam findings, POC, initial HEP Person educated: Patient Education method: Explanation, Demonstration, and Handouts Education comprehension: verbalized understanding, returned demonstration, and needs further education  HOME EXERCISE PROGRAM: Access Code: [email or web address] URL: [email or web address] Date: 01/03/2023 Prepared by: [employer]gy [employer]  Exercises - Sitting Knee Extension with Resistance  - 1 x daily - 7 x weekly - 3 sets - 10 reps - Seated Hamstring Curl with Anchored Resistance  - 1 x daily - 7 x weekly - 3 sets - 10 reps - Single Leg Stance with Support  - 1 x daily - 7 x weekly - 1 sets - 10 reps - 10 seconds hold - Straight  Leg Raise with External Rotation  - 1 x daily - 7 x weekly - 2 sets - 10 reps - Sidelying Hip Abduction  - 1 x daily - 7 x weekly - 2 sets - 10 reps - Clamshell with Resistance  - 1 x daily - 7 x weekly - 2 sets - 10 reps  ASSESSMENT:  CLINICAL IMPRESSION: Pt with good tolerance to addition of standing balance exercises today. Continues to fatigue quickly with sidelying hip abduction and SLR with ER. Vaso at end of session for edema and pain control  From eval: Patient is a 62 y.o. M who was seen today for physical therapy evaluation and treatment for L lumbar radiculopathy. Assessment significant for L>R glute weakness, core weakness, piriformis tightness with radiating symptoms from L hip to lateral L leg. Increased symptom centralization when pt performed prone press up. Pt would benefit from PT to address these deficits for improved overall function with work and community tasks. Pt states he will be on vacation next week -- will start PT when he returns.      GOALS: Goals reviewed with patient? Yes  SHORT TERM GOALS: Target date: 11/30/2022   Pt will be ind with initial HEP Baseline: Goal status: IN PROGRESS  2.  Pt will demo 5x STS of </=13  sec for increased functional LE strength Baseline:  Goal status: IN PROGRESS   LONG TERM GOALS: Target date: 02/14/2023    Pt will be ind with progression and management of HEP Baseline:  Goal status: INITIAL  2.  Pt will be able to walk at least 20 min with pain </=3/10 Baseline: 3-4 minutes Goal status: IN PROGRESS  3.  Pt will be able to sit to perform desk/work functions for at least 45 min with pain </=3/10 Baseline:  Goal status: MET  4.  Pt will be able to perform SLS for at least 10 sec on R & L LE to demo improved LE/core stability in standing Baseline: 6 Lt LE, 3 Rt LE Goal status: IN PROGRESS  5.  Pt will have improved FOTO to >/=54 Baseline:  Goal status: INITIAL   PLAN:  PT FREQUENCY: 2x/week   PT  DURATION: 6 weeks  PLANNED INTERVENTIONS: Therapeutic exercises, Therapeutic activity, Neuromuscular re-education, Balance training, Gait training, Patient/Family education, Self Care, Joint mobilization, Stair training, Aquatic Therapy, Dry Needling, Electrical stimulation, Spinal mobilization, Cryotherapy, Moist heat, Taping, Vasopneumatic device, Ionotophoresis 4mg /ml Dexamethasone, Manual therapy, and Re-evaluation.  PLAN FOR NEXT SESSION:knee strength, balance   Jia Mohamed, PT 01/05/2023, 8:33 AM

## 2023-01-10 ENCOUNTER — Ambulatory Visit: Payer: 59 | Admitting: Physical Therapy

## 2023-01-10 ENCOUNTER — Encounter: Payer: Self-pay | Admitting: Physical Therapy

## 2023-01-10 DIAGNOSIS — M25662 Stiffness of left knee, not elsewhere classified: Secondary | ICD-10-CM

## 2023-01-10 DIAGNOSIS — M6281 Muscle weakness (generalized): Secondary | ICD-10-CM

## 2023-01-10 DIAGNOSIS — M25562 Pain in left knee: Secondary | ICD-10-CM

## 2023-01-10 DIAGNOSIS — R262 Difficulty in walking, not elsewhere classified: Secondary | ICD-10-CM

## 2023-01-10 NOTE — Therapy (Signed)
OUTPATIENT PHYSICAL THERAPY TREATMENT   Patient Name: Ernest Navarro. MRN: [id number] DOB:1961/04/01, 62 y.o., male Today's Date: 01/10/2023  END OF SESSION:  PT End of Session - 01/10/23 1520     Visit Number 5    Date for PT Re-Evaluation 02/14/23    Authorization Type [employer]    PT Start Time 1445    PT Stop Time 1530    PT Time Calculation (min) 45 min    Activity Tolerance Patient tolerated treatment well    Behavior During Therapy WFL for tasks assessed/performed                Past Medical History:  Diagnosis Date   Arthritis    bilateral knees   Class 3 severe obesity due to excess calories with serious comorbidity and body mass index (BMI) of 40.0 to 44.9 in adult (HCC) 11/13/2018   Essential hypertension, benign 06/19/2012   Obstructive sleep apnea syndrome 05/06/2015   Formatting of this note might be different from the original. Overview: High probability diagnosis based on previous reported sleep study and weight gain with witnessed apneas and loud snoring. We discussed the basic physiology and medical concerns. Plan-schedule sleep study, emphasize driving responsibility and advise weight loss Last Assessment & Plan: Formatting of this note might be different    Other male erectile dysfunction 05/25/2017   Sleep apnea    Type 2 diabetes mellitus without complication, without long-term current use of insulin (HCC) 05/15/2019   Past Surgical History:  Procedure Laterality Date   KNEE SURGERY Right    x3 arthroscopies   TOTAL KNEE ARTHROPLASTY Right 10/28/2020   Procedure: RIGHT TOTAL KNEE ARTHROPLASTY;  Surgeon: Cammy Copa, MD;  Location: [facility] OR;  Service: Orthopedics;  Laterality: Right;   TOTAL KNEE ARTHROPLASTY Left 09/05/2021   Procedure: LEFT TOTAL KNEE ARTHROPLASTY;  Surgeon: Cammy Copa, MD;  Location: [facility] OR;  Service: Orthopedics;  Laterality: Left;   Patient Active Problem List   Diagnosis Date Noted   Arthritis of left knee    S/P  total knee arthroplasty, left 09/05/2021   S/P total knee arthroplasty, right 10/28/2020   Type 2 diabetes mellitus without complication, without long-term current use of insulin (HCC) 05/15/2019   Hypotestosteronism 05/13/2019   Class 3 severe obesity due to excess calories with serious comorbidity and body mass index (BMI) of 40.0 to 44.9 in adult (HCC) 11/13/2018   Other male erectile dysfunction 05/25/2017   Lichen planus 05/27/2015   Obstructive sleep apnea syndrome 05/06/2015   Gout 06/19/2012   Benign essential hypertension 06/19/2012    PCP: Suzan Slick, MD  REFERRING PROVIDER: Suzan Slick, MD  REFERRING DIAG: M54.50,M79.605 (ICD-10-CM) - Lumbar pain with radiation down left leg M54.32 (ICD-10-CM) - Sciatica of left side  Rationale for Evaluation and Treatment: Rehabilitation  THERAPY DIAG:  Muscle weakness (generalized)  Difficulty in walking, not elsewhere classified  Acute pain of left knee  Stiffness of left knee, not elsewhere classified  ONSET DATE: End of 2023  SUBJECTIVE:  SUBJECTIVE STATEMENT: Pt states his knees are "coming along". He continues to be "sore" but states he does not have "pain". Walking is getting easier  PERTINENT HISTORY:  R TKA in 2022, L TKA in 2023 From eval: Pt states he had a R TKA in 2022. Did L TKA summer of 2023. Pt reports he was getting swelling in his L knee and radiating pain into the left leg. Pt got imaging performed which demonstrated disc space loss at L5-S1. Pt states pain radiates from L hip down to leg. Pt states pain wakes him up in the middle of the night. Pt states pain is constant. Pt states side of his knee is a little numb but this may be due to the surgery. Does state knee buckles at times  PAIN:  Are you having pain? Yes:  NPRS scale: 1 in knee currently, 5/10 at worst/10 Pain location: L knee Pain description: throbbing/aching Aggravating factors: Walking (<5 min), sitting (5-10 min) Relieving factors: medication (to a certain degree), ice/heat   PRECAUTIONS: None  RED FLAGS: None   WEIGHT BEARING RESTRICTIONS: No  FALLS:  Has patient fallen in last 6 months? 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  Left knee x-ray 10/23/22 results pending  PATIENT SURVEYS:  FOTO 40; predicted 54     SENSATION: N/T L hip to lateral leg  MUSCLE LENGTH: Hamstrings: Right 80 deg; Left 70 deg Thomas test: Right 10 deg; Left 10 deg (pulls on L low back)  POSTURE: posterior pelvic tilt  PALPATION: TTP L piriformis and bilat QL  LUMBAR ROM:   AROM eval  Flexion 90% pain coming up  Extension 100% *  Right lateral flexion 100%  Left lateral flexion 100%  Right rotation 90% increases s/s  Left rotation 90% increases s/s   (Blank rows = not tested)    LOWER EXTREMITY MMT:    MMT Right eval Left eval Left 01/03/23  Hip flexion 4+ 4+ 4+ SLR with ER 4-/5  Hip extension 3+ 3+ 4-  Hip abduction 4+ 3+ 4-  Hip adduction     Hip internal rotation     Hip external rotation     Knee flexion 4 4- 4-  Knee extension 5 5 4   Ankle dorsiflexion     Ankle plantarflexion     Ankle inversion     Ankle eversion      (Blank rows = not tested)  LUMBAR SPECIAL TESTS:  Straight leg raise test: Positive, Slump test: Negative, Single leg stance test: Positive, FABER test: Positive, and Thomas  test: Positive  FUNCTIONAL TESTS:  5 times sit to stand: 15.26 sec SLS: 5 sec on R, 4 sec on L  GAIT: WNL  [facility] Adult PT Treatment:                                                DATE: 01/10/23 Therapeutic Exercise: Nustep L6 x 5 min for warm up Hip abd red TB 2  x 10 Hip diagonal red TB 2 x 10 Standing HS curl red TB 2 x 10 Heel raises x 20 Tandem stance on foam 2 x 30 sec Rocker board x 1 min each A/P and laterally Seated LAQ 5# with 5 sec hold x 10 TKE green TB 2 x 10 bilat  Modalities: Vaso Lt knee x 10 min low pressure 34 degrees   [facility] Adult PT Treatment:                                                DATE: 01/05/23 Therapeutic Exercise: Nustep L5 x 5 min for warm up LAQ green TB 2 x 10 HS curl green TB 2 x 10 Sidelying hip abd 2 x 10 SLR with ER 2 x 10 Supine clam green TB 2 x 10 Tandem stance on ground 2 x 30 seconds, tandem stance on foam 2 x 30 seconds Rocker board A/P and laterally x 1 min each  Modalities: Vaso Lt knee x 10 min low pressure 34 degrees   [facility] Adult PT Treatment:                                                DATE: 01/03/23 Therapeutic Exercise: MMT (see above) 5 x STS = 16.38 seconds SLS 6 Lt, 3 Rt LAQ green TB 2 x 10 HS curl green TB 2 x 10 SLR with ER x 10 Sidelying Hip abd x 10 Sidelying clam green TB x 10  Therapeutic Activity: TTP Lt ITB, multiple trigger points palpable  Self Care: Self ITB massage with rolling pin    PATIENT EDUCATION:  Education details: Exam findings, POC, initial HEP Person educated: Patient Education method: Explanation, Demonstration, and Handouts Education comprehension: verbalized understanding, returned demonstration, and needs further education  HOME EXERCISE PROGRAM: Access Code: [email or web address] URL: [email or web address] Date: 01/03/2023 Prepared by: Reggy Eye  Exercises - Sitting Knee Extension with Resistance  - 1 x daily - 7 x weekly - 3 sets - 10 reps - Seated Hamstring  Curl with Anchored Resistance  - 1 x daily - 7 x weekly - 3 sets - 10 reps - Single Leg Stance with Support  - 1 x daily - 7 x weekly - 1 sets - 10 reps - 10 seconds hold - Straight Leg Raise with External Rotation  - 1 x daily - 7 x weekly - 2 sets - 10 reps - Sidelying Hip Abduction  - 1 x daily - 7 x weekly - 2 sets - 10 reps - Clamshell with Resistance  - 1 x daily - 7 x weekly - 2 sets - 10 reps  ASSESSMENT:  CLINICAL IMPRESSION: Pt with improved tolerance to standing exercise today. Less seated rests throughout session. Still benefits from vaso at end of session for pain control and to reduce edema  From eval: Patient is a 62 y.o. M who was seen today for physical therapy evaluation and treatment for L lumbar radiculopathy. Assessment significant for L>R glute weakness, core weakness, piriformis tightness with radiating symptoms from L hip to lateral L leg. Increased symptom centralization when pt performed prone press up. Pt would benefit from PT to address these deficits for improved overall function with work and community tasks.  Pt states he will be on vacation next week -- will start PT when he returns.      GOALS: Goals reviewed with patient? Yes  SHORT TERM GOALS: Target date: 11/30/2022   Pt will be ind with initial HEP Baseline: Goal status: IN PROGRESS  2.  Pt will demo 5x STS of </=13 sec for increased functional LE strength Baseline:  Goal status: IN PROGRESS   LONG TERM GOALS: Target date: 02/14/2023    Pt will be ind with progression and management of HEP Baseline:  Goal status: INITIAL  2.  Pt will be able to walk at least 20 min with pain </=3/10 Baseline: 3-4 minutes Goal status: IN PROGRESS  3.  Pt will be able to sit to perform desk/work functions for at least 45 min with pain </=3/10 Baseline:  Goal status: MET  4.  Pt will be able to perform SLS for at least 10 sec on R & L LE to demo improved LE/core stability in standing Baseline: 6 Lt LE, 3  Rt LE Goal status: IN PROGRESS  5.  Pt will have improved FOTO to >/=54 Baseline:  Goal status: INITIAL   PLAN:  PT FREQUENCY: 2x/week   PT DURATION: 6 weeks  PLANNED INTERVENTIONS: Therapeutic exercises, Therapeutic activity, Neuromuscular re-education, Balance training, Gait training, Patient/Family education, Self Care, Joint mobilization, Stair training, Aquatic Therapy, Dry Needling, Electrical stimulation, Spinal mobilization, Cryotherapy, Moist heat, Taping, Vasopneumatic device, Ionotophoresis 4mg /ml Dexamethasone, Manual therapy, and Re-evaluation.  PLAN FOR NEXT SESSION:knee strength, balance   Amori Cooperman, PT 01/10/2023, 3:21 PM

## 2023-01-11 ENCOUNTER — Ambulatory Visit: Payer: 59 | Admitting: Physical Therapy

## 2023-01-12 ENCOUNTER — Ambulatory Visit: Payer: 59 | Admitting: Physical Therapy

## 2023-01-15 ENCOUNTER — Ambulatory Visit: Payer: 59 | Admitting: Physical Therapy

## 2023-01-16 ENCOUNTER — Telehealth: Payer: Self-pay | Admitting: Physical Medicine & Rehabilitation

## 2023-01-16 ENCOUNTER — Encounter: Payer: 59 | Attending: Physical Medicine & Rehabilitation | Admitting: Physical Medicine & Rehabilitation

## 2023-01-16 DIAGNOSIS — M5432 Sciatica, left side: Secondary | ICD-10-CM | POA: Insufficient documentation

## 2023-01-16 DIAGNOSIS — M79605 Pain in left leg: Secondary | ICD-10-CM | POA: Insufficient documentation

## 2023-01-16 DIAGNOSIS — M545 Low back pain, unspecified: Secondary | ICD-10-CM | POA: Insufficient documentation

## 2023-01-16 NOTE — Telephone Encounter (Signed)
Patient called to let you know that his back is doing better.  He forgot about his appointment with you today 01/16/23, we scheduled him in November for follow up.  He should be 3 weeks out from doing therapy.

## 2023-01-17 ENCOUNTER — Ambulatory Visit: Payer: 59 | Admitting: Physical Therapy

## 2023-01-17 ENCOUNTER — Encounter: Payer: Self-pay | Admitting: Physical Therapy

## 2023-01-17 DIAGNOSIS — M6281 Muscle weakness (generalized): Secondary | ICD-10-CM

## 2023-01-17 DIAGNOSIS — R262 Difficulty in walking, not elsewhere classified: Secondary | ICD-10-CM

## 2023-01-17 NOTE — Therapy (Signed)
OUTPATIENT PHYSICAL THERAPY TREATMENT   Patient Name: Ernest Navarro. MRN: [id number] DOB:1960-04-24, 62 y.o., male Today's Date: 01/17/2023  END OF SESSION:  PT End of Session - 01/17/23 1516     Visit Number 6    Number of Visits 15    Date for PT Re-Evaluation 02/14/23    Authorization Type [employer]    PT Start Time 1445    PT Stop Time 1530    PT Time Calculation (min) 45 min    Activity Tolerance Patient tolerated treatment well    Behavior During Therapy WFL for tasks assessed/performed                 Past Medical History:  Diagnosis Date   Arthritis    bilateral knees   Class 3 severe obesity due to excess calories with serious comorbidity and body mass index (BMI) of 40.0 to 44.9 in adult (HCC) 11/13/2018   Essential hypertension, benign 06/19/2012   Obstructive sleep apnea syndrome 05/06/2015   Formatting of this note might be different from the original. Overview: High probability diagnosis based on previous reported sleep study and weight gain with witnessed apneas and loud snoring. We discussed the basic physiology and medical concerns. Plan-schedule sleep study, emphasize driving responsibility and advise weight loss Last Assessment & Plan: Formatting of this note might be different    Other male erectile dysfunction 05/25/2017   Sleep apnea    Type 2 diabetes mellitus without complication, without long-term current use of insulin (HCC) 05/15/2019   Past Surgical History:  Procedure Laterality Date   KNEE SURGERY Right    x3 arthroscopies   TOTAL KNEE ARTHROPLASTY Right 10/28/2020   Procedure: RIGHT TOTAL KNEE ARTHROPLASTY;  Surgeon: Cammy Copa, MD;  Location: [facility] OR;  Service: Orthopedics;  Laterality: Right;   TOTAL KNEE ARTHROPLASTY Left 09/05/2021   Procedure: LEFT TOTAL KNEE ARTHROPLASTY;  Surgeon: Cammy Copa, MD;  Location: [facility] OR;  Service: Orthopedics;  Laterality: Left;   Patient Active Problem List   Diagnosis Date Noted    Arthritis of left knee    S/P total knee arthroplasty, left 09/05/2021   S/P total knee arthroplasty, right 10/28/2020   Type 2 diabetes mellitus without complication, without long-term current use of insulin (HCC) 05/15/2019   Hypotestosteronism 05/13/2019   Class 3 severe obesity due to excess calories with serious comorbidity and body mass index (BMI) of 40.0 to 44.9 in adult (HCC) 11/13/2018   Other male erectile dysfunction 05/25/2017   Lichen planus 05/27/2015   Obstructive sleep apnea syndrome 05/06/2015   Gout 06/19/2012   Benign essential hypertension 06/19/2012    PCP: Suzan Slick, MD  REFERRING PROVIDER: Suzan Slick, MD  REFERRING DIAG: M54.50,M79.605 (ICD-10-CM) - Lumbar pain with radiation down left leg M54.32 (ICD-10-CM) - Sciatica of left side  Rationale for Evaluation and Treatment: Rehabilitation  THERAPY DIAG:  Muscle weakness (generalized)  Difficulty in walking, not elsewhere classified  ONSET DATE: End of 2023  SUBJECTIVE:  SUBJECTIVE STATEMENT: Pt states his knees are "coming along". He has been performing his HEP and trying to walk more  PERTINENT HISTORY:  R TKA in 2022, L TKA in 2023 From eval: Pt states he had a R TKA in 2022. Did L TKA summer of 2023. Pt reports he was getting swelling in his L knee and radiating pain into the left leg. Pt got imaging performed which demonstrated disc space loss at L5-S1. Pt states pain radiates from L hip down to leg. Pt states pain wakes him up in the middle of the night. Pt states pain is constant. Pt states side of his knee is a little numb but this may be due to the surgery. Does state knee buckles at times  PAIN:  Are you having pain? Yes: NPRS scale: 1 in knee currently, 5/10 at worst/10 Pain location: L knee Pain  description: throbbing/aching Aggravating factors: Walking (<5 min), sitting (5-10 min) Relieving factors: medication (to a certain degree), ice/heat   PRECAUTIONS: None  RED FLAGS: None   WEIGHT BEARING RESTRICTIONS: No  FALLS:  Has patient fallen in last 6 months? 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  Left knee x-ray 10/23/22 results pending  PATIENT SURVEYS:  FOTO 40; predicted 54     SENSATION: N/T L hip to lateral leg  MUSCLE LENGTH: Hamstrings: Right 80 deg; Left 70 deg Thomas test: Right 10 deg; Left 10 deg (pulls on L low back)  POSTURE: posterior pelvic tilt  PALPATION: TTP L piriformis and bilat QL  LUMBAR ROM:   AROM eval  Flexion 90% pain coming up  Extension 100% *  Right lateral flexion 100%  Left lateral flexion 100%  Right rotation 90% increases s/s  Left rotation 90% increases s/s   (Blank rows = not tested)    LOWER EXTREMITY MMT:    MMT Right eval Left eval Left 01/03/23  Hip flexion 4+ 4+ 4+ SLR with ER 4-/5  Hip extension 3+ 3+ 4-  Hip abduction 4+ 3+ 4-  Hip adduction     Hip internal rotation     Hip external rotation     Knee flexion 4 4- 4-  Knee extension 5 5 4   Ankle dorsiflexion     Ankle plantarflexion     Ankle inversion     Ankle eversion      (Blank rows = not tested)  LUMBAR SPECIAL TESTS:  Straight leg raise test: Positive, Slump test: Negative, Single leg stance test: Positive, FABER test: Positive, and Thomas test: Positive  FUNCTIONAL TESTS:  5 times sit to stand: 15.26 sec SLS: 5  sec on R, 4 sec on L  GAIT: WNL  [facility] Adult PT Treatment:                                                DATE: 01/17/23 Therapeutic Exercise: Nustep L7 x 5 min warm up Resisted walking 25# x 5 fwd/bkwd Resisted walking sidestep 15# x  5 bilat Step up without UE support 6'' step x 10 bilat Sit <> stand 15# x 10 Tandem gait on airex beam intermittent UE support Sidestep on airex beam without UE support Seated LAQ 5# with 5 sec hold 2 x 10  Modalities: Vaso Lt knee x 10 min low pressure 34 degrees   [facility] Adult PT Treatment:                                                DATE: 01/10/23 Therapeutic Exercise: Nustep L6 x 5 min for warm up Hip abd red TB 2 x 10 Hip diagonal red TB 2 x 10 Standing HS curl red TB 2 x 10 Heel raises x 20 Tandem stance on foam 2 x 30 sec Rocker board x 1 min each A/P and laterally Seated LAQ 5# with 5 sec hold x 10 TKE green TB 2 x 10 bilat  Modalities: Vaso Lt knee x 10 min low pressure 34 degrees   [facility] Adult PT Treatment:                                                DATE: 01/05/23 Therapeutic Exercise: Nustep L5 x 5 min for warm up LAQ green TB 2 x 10 HS curl green TB 2 x 10 Sidelying hip abd 2 x 10 SLR with ER 2 x 10 Supine clam green TB 2 x 10 Tandem stance on ground 2 x 30 seconds, tandem stance on foam 2 x 30 seconds Rocker board A/P and laterally x 1 min each  Modalities: Vaso Lt knee x 10 min low pressure 34 degrees   PATIENT EDUCATION:  Education details: Exam findings, POC, initial HEP Person educated: Patient Education method: Explanation, Demonstration, and Handouts Education comprehension: verbalized understanding, returned demonstration, and needs further education  HOME EXERCISE PROGRAM: Access Code: [email or web address] URL: [email or web address] Date: 01/03/2023 Prepared by: Reggy Eye  Exercises - Sitting Knee Extension with Resistance  - 1 x daily - 7 x weekly - 3 sets - 10 reps - Seated Hamstring Curl  with Anchored Resistance  - 1 x daily - 7 x weekly - 3 sets - 10 reps - Single Leg Stance with Support  - 1 x daily - 7 x weekly - 1 sets - 10 reps - 10 seconds hold - Straight Leg Raise with External Rotation  - 1 x daily - 7 x weekly - 2 sets - 10 reps - Sidelying Hip Abduction  - 1 x daily - 7 x weekly - 2 sets - 10 reps - Clamshell with Resistance  - 1 x daily - 7 x weekly - 2 sets - 10 reps  ASSESSMENT:  CLINICAL IMPRESSION: Pt challenged by resisted walking and balance on airex beam. Good tolerance to continued strengthening. Pt is improving strength and endurance, progressing well towards goals  From eval: Patient is a 62 y.o. M who was seen today for physical therapy evaluation and treatment for L lumbar radiculopathy. Assessment significant for L>R glute weakness, core weakness, piriformis tightness with radiating symptoms from L hip to lateral L leg. Increased symptom centralization when pt performed prone press up. Pt would benefit from PT to address these deficits for improved overall function with work and  community tasks. Pt states he will be on vacation next week -- will start PT when he returns.      GOALS: Goals reviewed with patient? Yes  SHORT TERM GOALS: Target date: 11/30/2022   Pt will be ind with initial HEP Baseline: Goal status: IN PROGRESS  2.  Pt will demo 5x STS of </=13 sec for increased functional LE strength Baseline:  Goal status: IN PROGRESS   LONG TERM GOALS: Target date: 02/14/2023    Pt will be ind with progression and management of HEP Baseline:  Goal status: INITIAL  2.  Pt will be able to walk at least 20 min with pain </=3/10 Baseline: 3-4 minutes Goal status: IN PROGRESS  3.  Pt will be able to sit to perform desk/work functions for at least 45 min with pain </=3/10 Baseline:  Goal status: MET  4.  Pt will be able to perform SLS for at least 10 sec on R & L LE to demo improved LE/core stability in standing Baseline: 6 Lt LE, 3 Rt  LE Goal status: IN PROGRESS  5.  Pt will have improved FOTO to >/=54 Baseline:  Goal status: INITIAL   PLAN:  PT FREQUENCY: 2x/week   PT DURATION: 6 weeks  PLANNED INTERVENTIONS: Therapeutic exercises, Therapeutic activity, Neuromuscular re-education, Balance training, Gait training, Patient/Family education, Self Care, Joint mobilization, Stair training, Aquatic Therapy, Dry Needling, Electrical stimulation, Spinal mobilization, Cryotherapy, Moist heat, Taping, Vasopneumatic device, Ionotophoresis 4mg /ml Dexamethasone, Manual therapy, and Re-evaluation.  PLAN FOR NEXT SESSION:knee strength, balance   Dolce Sylvia, PT 01/17/2023, 3:17 PM

## 2023-01-18 ENCOUNTER — Ambulatory Visit: Payer: Self-pay | Admitting: Surgery

## 2023-01-19 ENCOUNTER — Other Ambulatory Visit: Payer: Self-pay | Admitting: Surgery

## 2023-01-19 ENCOUNTER — Encounter: Payer: Self-pay | Admitting: Physical Therapy

## 2023-01-19 ENCOUNTER — Ambulatory Visit: Payer: 59 | Admitting: Physical Therapy

## 2023-01-19 DIAGNOSIS — M6281 Muscle weakness (generalized): Secondary | ICD-10-CM | POA: Diagnosis not present

## 2023-01-19 DIAGNOSIS — M25562 Pain in left knee: Secondary | ICD-10-CM

## 2023-01-19 DIAGNOSIS — R262 Difficulty in walking, not elsewhere classified: Secondary | ICD-10-CM

## 2023-01-19 DIAGNOSIS — E210 Primary hyperparathyroidism: Secondary | ICD-10-CM

## 2023-01-19 NOTE — Therapy (Signed)
OUTPATIENT PHYSICAL THERAPY TREATMENT   Patient Name: Ernest Navarro. MRN: [id number] DOB:[birth year], 62 y.o., male Today's Date: 01/19/2023  END OF SESSION:  PT End of Session - 01/19/23 1048     Visit Number 7    Number of Visits 15    Date for PT Re-Evaluation 02/14/23    Authorization Type [employer]    PT Start Time 1015    PT Stop Time 1055    PT Time Calculation (min) 40 min    Activity Tolerance Patient tolerated treatment well    Behavior During Therapy WFL for tasks assessed/performed                  Past Medical History:  Diagnosis Date   Arthritis    bilateral knees   Class 3 severe obesity due to excess calories with serious comorbidity and body mass index (BMI) of 40.0 to 44.9 in adult (HCC) 11/13/2018   Essential hypertension, benign 06/19/2012   Obstructive sleep apnea syndrome 05/06/2015   Formatting of this note might be different from the original. Overview: High probability diagnosis based on previous reported sleep study and weight gain with witnessed apneas and loud snoring. We discussed the basic physiology and medical concerns. Plan-schedule sleep study, emphasize driving responsibility and advise weight loss Last Assessment & Plan: Formatting of this note might be different    Other male erectile dysfunction 05/25/2017   Sleep apnea    Type 2 diabetes mellitus without complication, without long-term current use of insulin (HCC) 05/15/2019   Past Surgical History:  Procedure Laterality Date   KNEE SURGERY Right    x3 arthroscopies   TOTAL KNEE ARTHROPLASTY Right 10/28/2020   Procedure: RIGHT TOTAL KNEE ARTHROPLASTY;  Surgeon: Cammy Copa, MD;  Location: [facility] OR;  Service: Orthopedics;  Laterality: Right;   TOTAL KNEE ARTHROPLASTY Left 09/05/2021   Procedure: LEFT TOTAL KNEE ARTHROPLASTY;  Surgeon: Cammy Copa, MD;  Location: [facility] OR;  Service: Orthopedics;  Laterality: Left;   Patient Active Problem List   Diagnosis Date Noted    Arthritis of left knee    S/P total knee arthroplasty, left 09/05/2021   S/P total knee arthroplasty, right 10/28/2020   Type 2 diabetes mellitus without complication, without long-term current use of insulin (HCC) 05/15/2019   Hypotestosteronism 05/13/2019   Class 3 severe obesity due to excess calories with serious comorbidity and body mass index (BMI) of 40.0 to 44.9 in adult (HCC) 11/13/2018   Other male erectile dysfunction 05/25/2017   Lichen planus 05/27/2015   Obstructive sleep apnea syndrome 05/06/2015   Gout 06/19/2012   Benign essential hypertension 06/19/2012    PCP: Suzan Slick, MD  REFERRING PROVIDER: Suzan Slick, MD  REFERRING DIAG: M54.50,M79.605 (ICD-10-CM) - Lumbar pain with radiation down left leg M54.32 (ICD-10-CM) - Sciatica of left side  Rationale for Evaluation and Treatment: Rehabilitation  THERAPY DIAG:  Muscle weakness (generalized)  Difficulty in walking, not elsewhere classified  Acute pain of left knee  ONSET DATE: End of 2023  SUBJECTIVE:  SUBJECTIVE STATEMENT: Pt states his Lt knee was sore after last visit - he thinks from the sit <> stand exercise. He requests to take it easy today  PERTINENT HISTORY:  R TKA in 2022, L TKA in 2023 From eval: Pt states he had a R TKA in 2022. Did L TKA summer of 2023. Pt reports he was getting swelling in his L knee and radiating pain into the left leg. Pt got imaging performed which demonstrated disc space loss at L5-S1. Pt states pain radiates from L hip down to leg. Pt states pain wakes him up in the middle of the night. Pt states pain is constant. Pt states side of his knee is a little numb but this may be due to the surgery. Does state knee buckles at times  PAIN:  Are you having pain? Yes: NPRS scale: 4 in knee  currently, 5/10 at worst/10 Pain location: L knee Pain description: throbbing/aching Aggravating factors: Walking (<5 min), sitting (5-10 min) Relieving factors: medication (to a certain degree), ice/heat   PRECAUTIONS: None  RED FLAGS: None   WEIGHT BEARING RESTRICTIONS: No  FALLS:  Has patient fallen in last 6 months? 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  Left knee x-ray 10/23/22 results pending  PATIENT SURVEYS:  FOTO 40; predicted 54     SENSATION: N/T L hip to lateral leg  MUSCLE LENGTH: Hamstrings: Right 80 deg; Left 70 deg Thomas test: Right 10 deg; Left 10 deg (pulls on L low back)  POSTURE: posterior pelvic tilt  PALPATION: TTP L piriformis and bilat QL  LUMBAR ROM:   AROM eval  Flexion 90% pain coming up  Extension 100% *  Right lateral flexion 100%  Left lateral flexion 100%  Right rotation 90% increases s/s  Left rotation 90% increases s/s   (Blank rows = not tested)    LOWER EXTREMITY MMT:    MMT Right eval Left eval Left 01/03/23  Hip flexion 4+ 4+ 4+ SLR with ER 4-/5  Hip extension 3+ 3+ 4-  Hip abduction 4+ 3+ 4-  Hip adduction     Hip internal rotation     Hip external rotation     Knee flexion 4 4- 4-  Knee extension 5 5 4   Ankle dorsiflexion     Ankle plantarflexion     Ankle inversion     Ankle eversion      (Blank rows = not tested)  LUMBAR SPECIAL TESTS:  Straight leg raise test: Positive, Slump test: Negative, Single leg stance test: Positive, FABER test: Positive, and Thomas test:  Positive  FUNCTIONAL TESTS:  5 times sit to stand: 15.26 sec SLS: 5 sec on R, 4 sec on L  GAIT: WNL  [facility] Adult PT Treatment:                                                DATE: 01/19/23 Therapeutic Exercise: Nustep L6 x 5 min SLR 3 x 10 bilat  SLR with ER 2 x 10 bilat Sidelying hip abd 2 x 10 bilat Supine clam green TB 2 x 10 Bridge green TB 2 x 10 Supine Adduction ball squeeze with alternating LAQ 2 x 10 Seated LAQ and HS curl 3 x 10 red TB  Modalities: Vaso Lt knee x 10 min low pressure 34 degrees   [facility] Adult PT Treatment:                                                DATE: 01/17/23 Therapeutic Exercise: Nustep L7 x 5 min warm up Resisted walking 25# x 5 fwd/bkwd Resisted walking sidestep 15# x 5 bilat Step up without UE support 6'' step x 10 bilat Sit <> stand 15# x 10 Tandem gait on airex beam intermittent UE support Sidestep on airex beam without UE support Seated LAQ 5# with 5 sec hold 2 x 10  Modalities: Vaso Lt knee x 10 min low pressure 34 degrees   [facility] Adult PT Treatment:                                                DATE: 01/10/23 Therapeutic Exercise: Nustep L6 x 5 min for warm up Hip abd red TB 2 x 10 Hip diagonal red TB 2 x 10 Standing HS curl red TB 2 x 10 Heel raises x 20 Tandem stance on foam 2 x 30 sec Rocker board x 1 min each A/P and laterally Seated LAQ 5# with 5 sec hold x 10 TKE green TB 2 x 10 bilat  Modalities: Vaso Lt knee x 10 min low pressure 34 degrees   PATIENT EDUCATION:  Education details: Exam findings, POC, initial HEP Person educated: Patient Education method: Explanation, Demonstration, and Handouts Education comprehension: verbalized understanding, returned demonstration, and needs further education  HOME EXERCISE PROGRAM: Access Code: [email or web address] URL: [email or web address] Date: 01/03/2023 Prepared by: Reggy Eye  Exercises - Sitting Knee Extension with Resistance  - 1 x daily - 7 x weekly -  3 sets - 10 reps - Seated Hamstring Curl with Anchored Resistance  - 1 x daily - 7 x weekly - 3 sets - 10 reps - Single Leg Stance with Support  - 1 x daily - 7 x weekly - 1 sets - 10 reps - 10 seconds hold - Straight Leg Raise with External Rotation  - 1 x daily - 7 x weekly - 2 sets - 10 reps - Sidelying Hip Abduction  - 1 x daily - 7 x weekly - 2 sets - 10 reps - Clamshell with Resistance  - 1 x daily - 7 x weekly - 2 sets - 10 reps  ASSESSMENT:  CLINICAL IMPRESSION: Session modified to NWB today due to increase in knee pain. Pt requires rest between sets but is able to perform 3 x 10 for most exercises. Decreased knee pain at end of session after vaso.  From eval: Patient is a 62 y.o. M who was seen today for physical therapy evaluation and treatment for L lumbar radiculopathy. Assessment significant for L>R glute weakness, core weakness, piriformis tightness with radiating symptoms from L hip to lateral L leg. Increased symptom centralization when pt performed prone press up. Pt would benefit from PT  to address these deficits for improved overall function with work and community tasks. Pt states he will be on vacation next week -- will start PT when he returns.      GOALS: Goals reviewed with patient? Yes  SHORT TERM GOALS: Target date: 11/30/2022   Pt will be ind with initial HEP Baseline: Goal status: IN PROGRESS  2.  Pt will demo 5x STS of </=13 sec for increased functional LE strength Baseline:  Goal status: IN PROGRESS   LONG TERM GOALS: Target date: 02/14/2023    Pt will be ind with progression and management of HEP Baseline:  Goal status: INITIAL  2.  Pt will be able to walk at least 20 min with pain </=3/10 Baseline: 3-4 minutes Goal status: IN PROGRESS  3.  Pt will be able to sit to perform desk/work functions for at least 45 min with pain </=3/10 Baseline:  Goal status: MET  4.  Pt will be able to perform SLS for at least 10 sec on R & L LE to demo  improved LE/core stability in standing Baseline: 6 Lt LE, 3 Rt LE Goal status: IN PROGRESS  5.  Pt will have improved FOTO to >/=54 Baseline:  Goal status: INITIAL   PLAN:  PT FREQUENCY: 2x/week   PT DURATION: 6 weeks  PLANNED INTERVENTIONS: Therapeutic exercises, Therapeutic activity, Neuromuscular re-education, Balance training, Gait training, Patient/Family education, Self Care, Joint mobilization, Stair training, Aquatic Therapy, Dry Needling, Electrical stimulation, Spinal mobilization, Cryotherapy, Moist heat, Taping, Vasopneumatic device, Ionotophoresis 4mg /ml Dexamethasone, Manual therapy, and Re-evaluation.  PLAN FOR NEXT SESSION: return to standing therex if tolerable, knee strength, balance   Jaequan Propes, PT 01/19/2023, 10:48 AM

## 2023-01-23 ENCOUNTER — Ambulatory Visit
Admission: RE | Admit: 2023-01-23 | Discharge: 2023-01-23 | Disposition: A | Discharge status: Home / Self Care | Payer: 59 | Source: Ambulatory Visit | Attending: Surgery | Admitting: Surgery

## 2023-01-23 DIAGNOSIS — E210 Primary hyperparathyroidism: Secondary | ICD-10-CM

## 2023-01-24 ENCOUNTER — Ambulatory Visit: Payer: 59 | Admitting: Physical Therapy

## 2023-01-26 ENCOUNTER — Encounter: Payer: Self-pay | Admitting: Physical Therapy

## 2023-01-26 ENCOUNTER — Ambulatory Visit: Payer: 59 | Admitting: Physical Therapy

## 2023-01-26 DIAGNOSIS — M6281 Muscle weakness (generalized): Secondary | ICD-10-CM

## 2023-01-26 DIAGNOSIS — M25562 Pain in left knee: Secondary | ICD-10-CM

## 2023-01-26 DIAGNOSIS — R262 Difficulty in walking, not elsewhere classified: Secondary | ICD-10-CM

## 2023-01-26 NOTE — Therapy (Signed)
OUTPATIENT PHYSICAL THERAPY TREATMENT   Patient Name: Ernest Navarro. MRN: [id number] DOB:19-Feb-1961, 62 y.o., male Today's Date: 01/26/2023  END OF SESSION:  PT End of Session - 01/26/23 1103     Visit Number 8    Number of Visits 15    Date for PT Re-Evaluation 02/14/23    Authorization Type [employer]    PT Start Time 1030    PT Stop Time 1110    PT Time Calculation (min) 40 min    Activity Tolerance Patient tolerated treatment well    Behavior During Therapy WFL for tasks assessed/performed                   Past Medical History:  Diagnosis Date   Arthritis    bilateral knees   Class 3 severe obesity due to excess calories with serious comorbidity and body mass index (BMI) of 40.0 to 44.9 in adult (HCC) 11/13/2018   Essential hypertension, benign 06/19/2012   Obstructive sleep apnea syndrome 05/06/2015   Formatting of this note might be different from the original. Overview: High probability diagnosis based on previous reported sleep study and weight gain with witnessed apneas and loud snoring. We discussed the basic physiology and medical concerns. Plan-schedule sleep study, emphasize driving responsibility and advise weight loss Last Assessment & Plan: Formatting of this note might be different    Other male erectile dysfunction 05/25/2017   Sleep apnea    Type 2 diabetes mellitus without complication, without long-term current use of insulin (HCC) 05/15/2019   Past Surgical History:  Procedure Laterality Date   KNEE SURGERY Right    x3 arthroscopies   TOTAL KNEE ARTHROPLASTY Right 10/28/2020   Procedure: RIGHT TOTAL KNEE ARTHROPLASTY;  Surgeon: Cammy Copa, MD;  Location: [facility] OR;  Service: Orthopedics;  Laterality: Right;   TOTAL KNEE ARTHROPLASTY Left 09/05/2021   Procedure: LEFT TOTAL KNEE ARTHROPLASTY;  Surgeon: Cammy Copa, MD;  Location: [facility] OR;  Service: Orthopedics;  Laterality: Left;   Patient Active Problem List   Diagnosis Date Noted    Arthritis of left knee    S/P total knee arthroplasty, left 09/05/2021   S/P total knee arthroplasty, right 10/28/2020   Type 2 diabetes mellitus without complication, without long-term current use of insulin (HCC) 05/15/2019   Hypotestosteronism 05/13/2019   Class 3 severe obesity due to excess calories with serious comorbidity and body mass index (BMI) of 40.0 to 44.9 in adult (HCC) 11/13/2018   Other male erectile dysfunction 05/25/2017   Lichen planus 05/27/2015   Obstructive sleep apnea syndrome 05/06/2015   Gout 06/19/2012   Benign essential hypertension 06/19/2012    PCP: Suzan Slick, MD  REFERRING PROVIDER: Suzan Slick, MD  REFERRING DIAG: M54.50,M79.605 (ICD-10-CM) - Lumbar pain with radiation down left leg M54.32 (ICD-10-CM) - Sciatica of left side  Rationale for Evaluation and Treatment: Rehabilitation  THERAPY DIAG:  Muscle weakness (generalized)  Difficulty in walking, not elsewhere classified  Acute pain of left knee  ONSET DATE: End of 2023  SUBJECTIVE:  SUBJECTIVE STATEMENT: Pt states he continues to have Lt knee pain 2-3/10 at all times. Still feels he is swollen today. Requests to do NWB exercises today  PERTINENT HISTORY:  R TKA in 2022, L TKA in 2023 From eval: Pt states he had a R TKA in 2022. Did L TKA summer of 2023. Pt reports he was getting swelling in his L knee and radiating pain into the left leg. Pt got imaging performed which demonstrated disc space loss at L5-S1. Pt states pain radiates from L hip down to leg. Pt states pain wakes him up in the middle of the night. Pt states pain is constant. Pt states side of his knee is a little numb but this may be due to the surgery. Does state knee buckles at times  PAIN:  Are you having pain? Yes: NPRS scale: 2 in  knee currently, 5/10 at worst/10 Pain location: L knee Pain description: throbbing/aching Aggravating factors: Walking (<5 min), sitting (5-10 min) Relieving factors: medication (to a certain degree), ice/heat   PRECAUTIONS: None  RED FLAGS: None   WEIGHT BEARING RESTRICTIONS: No  FALLS:  Has patient fallen in last 6 months? 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  Left knee x-ray 10/23/22 results pending  PATIENT SURVEYS:  FOTO 40; predicted 54     SENSATION: N/T L hip to lateral leg  MUSCLE LENGTH: Hamstrings: Right 80 deg; Left 70 deg Thomas test: Right 10 deg; Left 10 deg (pulls on L low back)  POSTURE: posterior pelvic tilt  PALPATION: TTP L piriformis and bilat QL  LUMBAR ROM:   AROM eval  Flexion 90% pain coming up  Extension 100% *  Right lateral flexion 100%  Left lateral flexion 100%  Right rotation 90% increases s/s  Left rotation 90% increases s/s   (Blank rows = not tested)    LOWER EXTREMITY MMT:    MMT Right eval Left eval Left 01/03/23  Hip flexion 4+ 4+ 4+ SLR with ER 4-/5  Hip extension 3+ 3+ 4-  Hip abduction 4+ 3+ 4-  Hip adduction     Hip internal rotation     Hip external rotation     Knee flexion 4 4- 4-  Knee extension 5 5 4   Ankle dorsiflexion     Ankle plantarflexion     Ankle inversion     Ankle eversion      (Blank rows = not tested)  LUMBAR SPECIAL TESTS:  Straight leg raise test: Positive, Slump test: Negative, Single leg stance test: Positive, FABER test: Positive, and Thomas test:  Positive  FUNCTIONAL TESTS:  5 times sit to stand: 15.26 sec SLS: 5 sec on R, 4 sec on L  GAIT: WNL  [facility] Adult PT Treatment:                                                DATE: 01/26/23 Therapeutic Exercise: Nustep L7 x 5 min for warm up SLR 3 x  10 bilat SLR with ER 2 x 10 bilat Supine clam green TB 3 x 10 Bridge green TB 3 x 10 hip Adduction ball squeeze with alternating LAQ 2 x 10 LAQ/HS curl red TB 3 x 10  Modalities: Vaso x 10 min Lt knee 34 degrees low pressure   [facility] Adult PT Treatment:                                                DATE: 01/19/23 Therapeutic Exercise: Nustep L6 x 5 min SLR 3 x 10 bilat SLR with ER 2 x 10 bilat Sidelying hip abd 2 x 10 bilat Supine clam green TB 2 x 10 Bridge green TB 2 x 10 Supine Adduction ball squeeze with alternating LAQ 2 x 10 Seated LAQ and HS curl 3 x 10 red TB  Modalities: Vaso Lt knee x 10 min low pressure 34 degrees   [facility] Adult PT Treatment:                                                DATE: 01/17/23 Therapeutic Exercise: Nustep L7 x 5 min warm up Resisted walking 25# x 5 fwd/bkwd Resisted walking sidestep 15# x 5 bilat Step up without UE support 6'' step x 10 bilat Sit <> stand 15# x 10 Tandem gait on airex beam intermittent UE support Sidestep on airex beam without UE support Seated LAQ 5# with 5 sec hold 2 x 10  Modalities: Vaso Lt knee x 10 min low pressure 34 degrees   PATIENT EDUCATION:  Education details: Exam findings, POC, initial HEP Person educated: Patient Education method: Explanation, Demonstration, and Handouts Education comprehension: verbalized understanding, returned demonstration, and needs further education  HOME EXERCISE PROGRAM: Access Code: [email or web address] URL: [email or web address] Date: 01/03/2023 Prepared by: Reggy Eye  Exercises - Sitting Knee Extension with Resistance  - 1 x daily - 7 x weekly - 3 sets - 10 reps - Seated Hamstring Curl with Anchored  Resistance  - 1 x daily - 7 x weekly - 3 sets - 10 reps - Single Leg Stance with Support  - 1 x daily - 7 x weekly - 1 sets - 10 reps - 10 seconds hold - Straight Leg Raise with External Rotation  - 1 x daily - 7 x weekly - 2 sets - 10 reps - Sidelying Hip Abduction  - 1 x daily - 7 x weekly - 2 sets - 10 reps - Clamshell with Resistance  - 1 x daily - 7 x weekly - 2 sets - 10 reps  ASSESSMENT:  CLINICAL IMPRESSION: Continued modified exercises due to increase in LT knee pain. Pt with good tolerance to increase in sets today to progress endurance.   From eval: Patient is a 62 y.o. M who was seen today for physical therapy evaluation and treatment for L lumbar radiculopathy. Assessment significant for L>R glute weakness, core weakness, piriformis tightness with radiating symptoms from L hip to lateral L leg. Increased symptom centralization when pt performed prone press up. Pt would benefit from PT to address these deficits for improved overall function with work and community tasks. Pt states he will be on vacation next week -- will start PT when he returns.  GOALS: Goals reviewed with patient? Yes  SHORT TERM GOALS: Target date: 11/30/2022   Pt will be ind with initial HEP Baseline: Goal status: IN PROGRESS  2.  Pt will demo 5x STS of </=13 sec for increased functional LE strength Baseline:  Goal status: IN PROGRESS   LONG TERM GOALS: Target date: 02/14/2023    Pt will be ind with progression and management of HEP Baseline:  Goal status: INITIAL  2.  Pt will be able to walk at least 20 min with pain </=3/10 Baseline: 3-4 minutes Goal status: IN PROGRESS  3.  Pt will be able to sit to perform desk/work functions for at least 45 min with pain </=3/10 Baseline:  Goal status: MET  4.  Pt will be able to perform SLS for at least 10 sec on R & L LE to demo improved LE/core stability in standing Baseline: 6 Lt LE, 3 Rt LE Goal status: IN PROGRESS  5.  Pt will have  improved FOTO to >/=54 Baseline:  Goal status: INITIAL   PLAN:  PT FREQUENCY: 2x/week   PT DURATION: 6 weeks  PLANNED INTERVENTIONS: Therapeutic exercises, Therapeutic activity, Neuromuscular re-education, Balance training, Gait training, Patient/Family education, Self Care, Joint mobilization, Stair training, Aquatic Therapy, Dry Needling, Electrical stimulation, Spinal mobilization, Cryotherapy, Moist heat, Taping, Vasopneumatic device, Ionotophoresis 4mg /ml Dexamethasone, Manual therapy, and Re-evaluation.  PLAN FOR NEXT SESSION: return to standing therex if tolerable, knee strength, balance   Jakeim Sedore, PT 01/26/2023, 11:04 AM

## 2023-01-29 NOTE — Progress Notes (Signed)
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  Will plan to proceed with minimally invasive parathyroidectomy as an outpatient procedure as discussed in the office.  I will enter orders and forward to schedulers to contact the patient.  tmg  Darnell Level, MD [facility] practice Office: [phone]

## 2023-01-31 ENCOUNTER — Ambulatory Visit: Payer: 59 | Admitting: Physical Therapy

## 2023-01-31 ENCOUNTER — Encounter: Payer: Self-pay | Admitting: Physical Therapy

## 2023-01-31 DIAGNOSIS — R262 Difficulty in walking, not elsewhere classified: Secondary | ICD-10-CM

## 2023-01-31 DIAGNOSIS — M6281 Muscle weakness (generalized): Secondary | ICD-10-CM

## 2023-01-31 NOTE — Therapy (Signed)
OUTPATIENT PHYSICAL THERAPY TREATMENT   Patient Name: Ernest Navarro. MRN: [id number] DOB:[birth year], 62 y.o., male Today's Date: 01/31/2023  END OF SESSION:  PT End of Session - 01/31/23 1353     Visit Number 9    Number of Visits 15    Date for PT Re-Evaluation 02/14/23    Authorization Type [employer]    PT Start Time 1315    PT Stop Time 1358    PT Time Calculation (min) 43 min    Activity Tolerance Patient tolerated treatment well    Behavior During Therapy WFL for tasks assessed/performed                    Past Medical History:  Diagnosis Date   Arthritis    bilateral knees   Class 3 severe obesity due to excess calories with serious comorbidity and body mass index (BMI) of 40.0 to 44.9 in adult (HCC) 11/13/2018   Essential hypertension, benign 06/19/2012   Obstructive sleep apnea syndrome 05/06/2015   Formatting of this note might be different from the original. Overview: High probability diagnosis based on previous reported sleep study and weight gain with witnessed apneas and loud snoring. We discussed the basic physiology and medical concerns. Plan-schedule sleep study, emphasize driving responsibility and advise weight loss Last Assessment & Plan: Formatting of this note might be different    Other male erectile dysfunction 05/25/2017   Sleep apnea    Type 2 diabetes mellitus without complication, without long-term current use of insulin (HCC) 05/15/2019   Past Surgical History:  Procedure Laterality Date   KNEE SURGERY Right    x3 arthroscopies   TOTAL KNEE ARTHROPLASTY Right 10/28/2020   Procedure: RIGHT TOTAL KNEE ARTHROPLASTY;  Surgeon: Cammy Copa, MD;  Location: [facility] OR;  Service: Orthopedics;  Laterality: Right;   TOTAL KNEE ARTHROPLASTY Left 09/05/2021   Procedure: LEFT TOTAL KNEE ARTHROPLASTY;  Surgeon: Cammy Copa, MD;  Location: [facility] OR;  Service: Orthopedics;  Laterality: Left;   Patient Active Problem List   Diagnosis Date Noted    Arthritis of left knee    S/P total knee arthroplasty, left 09/05/2021   S/P total knee arthroplasty, right 10/28/2020   Type 2 diabetes mellitus without complication, without long-term current use of insulin (HCC) 05/15/2019   Hypotestosteronism 05/13/2019   Class 3 severe obesity due to excess calories with serious comorbidity and body mass index (BMI) of 40.0 to 44.9 in adult (HCC) 11/13/2018   Other male erectile dysfunction 05/25/2017   Lichen planus 05/27/2015   Obstructive sleep apnea syndrome 05/06/2015   Gout 06/19/2012   Benign essential hypertension 06/19/2012    PCP: Suzan Slick, MD  REFERRING PROVIDER: Suzan Slick, MD  REFERRING DIAG: M54.50,M79.605 (ICD-10-CM) - Lumbar pain with radiation down left leg M54.32 (ICD-10-CM) - Sciatica of left side  Rationale for Evaluation and Treatment: Rehabilitation  THERAPY DIAG:  Muscle weakness (generalized)  Difficulty in walking, not elsewhere classified  ONSET DATE: End of 2023  SUBJECTIVE:  SUBJECTIVE STATEMENT: Pt states his knee is not as swollen anymore, finally feeling better. Pt states he is able to walk further before being limited by pain (5-28minutes)  PERTINENT HISTORY:  R TKA in 2022, L TKA in 2023 From eval: Pt states he had a R TKA in 2022. Did L TKA summer of 2023. Pt reports he was getting swelling in his L knee and radiating pain into the left leg. Pt got imaging performed which demonstrated disc space loss at L5-S1. Pt states pain radiates from L hip down to leg. Pt states pain wakes him up in the middle of the night. Pt states pain is constant. Pt states side of his knee is a little numb but this may be due to the surgery. Does state knee buckles at times  PAIN:  Are you having pain? Yes: NPRS scale: 2 in knee  currently, 5/10 at worst/10 Pain location: L knee Pain description: throbbing/aching Aggravating factors: Walking (<5 min), sitting (5-10 min) Relieving factors: medication (to a certain degree), ice/heat   PRECAUTIONS: None  RED FLAGS: None   WEIGHT BEARING RESTRICTIONS: No  FALLS:  Has patient fallen in last 6 months? 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  Left knee x-ray 10/23/22 results pending  PATIENT SURVEYS:  FOTO 40; predicted 54     SENSATION: N/T L hip to lateral leg  MUSCLE LENGTH: Hamstrings: Right 80 deg; Left 70 deg Thomas test: Right 10 deg; Left 10 deg (pulls on L low back)  POSTURE: posterior pelvic tilt  PALPATION: TTP L piriformis and bilat QL  LUMBAR ROM:   AROM eval  Flexion 90% pain coming up  Extension 100% *  Right lateral flexion 100%  Left lateral flexion 100%  Right rotation 90% increases s/s  Left rotation 90% increases s/s   (Blank rows = not tested)    LOWER EXTREMITY MMT:    MMT Right eval Left eval Left 01/03/23  Hip flexion 4+ 4+ 4+ SLR with ER 4-/5  Hip extension 3+ 3+ 4-  Hip abduction 4+ 3+ 4-  Hip adduction     Hip internal rotation     Hip external rotation     Knee flexion 4 4- 4-  Knee extension 5 5 4   Ankle dorsiflexion     Ankle plantarflexion     Ankle inversion     Ankle eversion      (Blank rows = not tested)  LUMBAR SPECIAL TESTS:  Straight leg raise test: Positive, Slump test: Negative, Single leg stance test: Positive, FABER test: Positive, and Thomas test:  Positive  FUNCTIONAL TESTS:  5 times sit to stand: 15.26 sec  (eval) 14.28 seconds SLS: 5 sec on R, 4 sec on L (eval) Rt 10 sec, 19 sec  GAIT: WNL  [facility] Adult PT Treatment:                                                DATE: 01/31/23 Therapeutic Exercise:  Nustep L7 x 5 min warm up Standing hip abd 2 x 10 bilat red TB Standing SLR red TB 2 x 10 bilat Standing hip ext red TB 2 x 10 bilat Heel raise x 20 bilat SLS repetitions 5 x STS (see results above) Walking x 6 minutes LAQ/HS curl x 10  Modalities: Vaso x 10 min Lt knee 34 degrees low pressure   [facility] Adult PT Treatment:                                                DATE: 01/26/23 Therapeutic Exercise: Nustep L7 x 5 min for warm up SLR 3 x 10 bilat SLR with ER 2 x 10 bilat Supine clam green TB 3 x 10 Bridge green TB 3 x 10 hip Adduction ball squeeze with alternating LAQ 2 x 10 LAQ/HS curl red TB 3 x 10  Modalities: Vaso x 10 min Lt knee 34 degrees low pressure   [facility] Adult PT Treatment:                                                DATE: 01/19/23 Therapeutic Exercise: Nustep L6 x 5 min SLR 3 x 10 bilat SLR with ER 2 x 10 bilat Sidelying hip abd 2 x 10 bilat Supine clam green TB 2 x 10 Bridge green TB 2 x 10 Supine Adduction ball squeeze with alternating LAQ 2 x 10 Seated LAQ and HS curl 3 x 10 red TB  Modalities: Vaso Lt knee x 10 min low pressure 34 degrees  PATIENT EDUCATION:  Education details: Exam findings, POC, initial HEP Person educated: Patient Education method: Explanation, Demonstration, and Handouts Education comprehension: verbalized understanding, returned demonstration, and needs further education  HOME EXERCISE PROGRAM: Access Code: [email or web address] URL: [email or web address] Date: 01/03/2023 Prepared by: Reggy Eye  Exercises - Sitting Knee Extension with Resistance  - 1 x daily - 7 x weekly - 3 sets - 10 reps - Seated Hamstring Curl with Anchored Resistance  - 1 x  daily - 7 x weekly - 3 sets - 10 reps - Single Leg Stance with Support  - 1 x daily - 7 x weekly - 1 sets - 10 reps - 10 seconds hold - Straight Leg Raise with External Rotation  - 1 x daily - 7 x weekly - 2 sets - 10 reps - Sidelying Hip Abduction  - 1 x daily - 7 x weekly - 2 sets - 10 reps - Clamshell with Resistance  - 1 x daily - 7 x weekly - 2 sets - 10 reps  ASSESSMENT:  CLINICAL IMPRESSION: Pt is progressing well towards goals. He has improved balance and strength. He is progressing towards walking goal. Focus of continued treatment will be improving muscular endurance to improve walking tolerance so pt can be independent in home walking program  From eval: Patient is a 62 y.o. M who was seen today for physical therapy evaluation and treatment for L lumbar radiculopathy. Assessment significant for L>R glute weakness, core weakness, piriformis tightness with radiating symptoms from L hip to lateral L leg. Increased symptom centralization when pt performed prone press up. Pt would benefit from PT to address these deficits for improved overall function with work and  community tasks. Pt states he will be on vacation next week -- will start PT when he returns.      GOALS: Goals reviewed with patient? Yes  SHORT TERM GOALS: Target date: 11/30/2022   Pt will be ind with initial HEP Baseline: Goal status: IN PROGRESS  2.  Pt will demo 5x STS of </=13 sec for increased functional LE strength Baseline:  Goal status: MET   LONG TERM GOALS: Target date: 02/14/2023    Pt will be ind with progression and management of HEP Baseline:  Goal status: INITIAL  2.  Pt will be able to walk at least 20 min with pain </=3/10 Baseline: 3-4 minutes Goal status: IN PROGRESS  3.  Pt will be able to sit to perform desk/work functions for at least 45 min with pain </=3/10 Baseline:  Goal status: MET  4.  Pt will be able to perform SLS for at least 10 sec on R & L LE to demo improved LE/core  stability in standing Baseline: 6 Lt LE, 3 Rt LE Goal status: UPGRADED - see goal 6  5.  Pt will have improved FOTO to >/=54 Baseline:  Goal status: INITIAL 6.  Pt will improve SLS >= 15 seconds bilat to demo improved balance Baseline:  Goal status: INITIAL    PLAN:  PT FREQUENCY: 2x/week   PT DURATION: 6 weeks  PLANNED INTERVENTIONS: Therapeutic exercises, Therapeutic activity, Neuromuscular re-education, Balance training, Gait training, Patient/Family education, Self Care, Joint mobilization, Stair training, Aquatic Therapy, Dry Needling, Electrical stimulation, Spinal mobilization, Cryotherapy, Moist heat, Taping, Vasopneumatic device, Ionotophoresis 4mg /ml Dexamethasone, Manual therapy, and Re-evaluation.  PLAN FOR NEXT SESSION:  knee strength, balance   Xayden Linsey, PT 01/31/2023, 1:53 PM

## 2023-02-07 ENCOUNTER — Ambulatory Visit: Payer: 59 | Attending: Family Medicine | Admitting: Physical Therapy

## 2023-02-07 ENCOUNTER — Encounter: Payer: Self-pay | Admitting: Physical Therapy

## 2023-02-07 DIAGNOSIS — R262 Difficulty in walking, not elsewhere classified: Secondary | ICD-10-CM | POA: Diagnosis present

## 2023-02-07 DIAGNOSIS — M6281 Muscle weakness (generalized): Secondary | ICD-10-CM

## 2023-02-07 DIAGNOSIS — M25562 Pain in left knee: Secondary | ICD-10-CM | POA: Diagnosis present

## 2023-02-07 NOTE — Therapy (Addendum)
OUTPATIENT PHYSICAL THERAPY TREATMENT AND DISCHARGE   Patient Name: Ernest Navarro. MRN: [id number] DOB:Oct 26, 1960, 62 y.o., male Today's Date: 02/07/2023  END OF SESSION:  PT End of Session - 02/07/23 1220     Visit Number 10    Number of Visits 15    Date for PT Re-Evaluation 02/14/23    Authorization Type [employer]    PT Start Time 1144    PT Stop Time 1225    PT Time Calculation (min) 41 min    Activity Tolerance Patient tolerated treatment well    Behavior During Therapy WFL for tasks assessed/performed                     Past Medical History:  Diagnosis Date   Arthritis    bilateral knees   Class 3 severe obesity due to excess calories with serious comorbidity and body mass index (BMI) of 40.0 to 44.9 in adult (HCC) 11/13/2018   Essential hypertension, benign 06/19/2012   Obstructive sleep apnea syndrome 05/06/2015   Formatting of this note might be different from the original. Overview: High probability diagnosis based on previous reported sleep study and weight gain with witnessed apneas and loud snoring. We discussed the basic physiology and medical concerns. Plan-schedule sleep study, emphasize driving responsibility and advise weight loss Last Assessment & Plan: Formatting of this note might be different    Other male erectile dysfunction 05/25/2017   Sleep apnea    Type 2 diabetes mellitus without complication, without long-term current use of insulin (HCC) 05/15/2019   Past Surgical History:  Procedure Laterality Date   KNEE SURGERY Right    x3 arthroscopies   TOTAL KNEE ARTHROPLASTY Right 10/28/2020   Procedure: RIGHT TOTAL KNEE ARTHROPLASTY;  Surgeon: Cammy Copa, MD;  Location: [facility] OR;  Service: Orthopedics;  Laterality: Right;   TOTAL KNEE ARTHROPLASTY Left 09/05/2021   Procedure: LEFT TOTAL KNEE ARTHROPLASTY;  Surgeon: Cammy Copa, MD;  Location: [facility] OR;  Service: Orthopedics;  Laterality: Left;   Patient Active Problem List    Diagnosis Date Noted   Arthritis of left knee    S/P total knee arthroplasty, left 09/05/2021   S/P total knee arthroplasty, right 10/28/2020   Type 2 diabetes mellitus without complication, without long-term current use of insulin (HCC) 05/15/2019   Hypotestosteronism 05/13/2019   Class 3 severe obesity due to excess calories with serious comorbidity and body mass index (BMI) of 40.0 to 44.9 in adult (HCC) 11/13/2018   Other male erectile dysfunction 05/25/2017   Lichen planus 05/27/2015   Obstructive sleep apnea syndrome 05/06/2015   Gout 06/19/2012   Benign essential hypertension 06/19/2012    PCP: Suzan Slick, MD  REFERRING PROVIDER: Suzan Slick, MD  REFERRING DIAG: M54.50,M79.605 (ICD-10-CM) - Lumbar pain with radiation down left leg M54.32 (ICD-10-CM) - Sciatica of left side  Rationale for Evaluation and Treatment: Rehabilitation  THERAPY DIAG:  Muscle weakness (generalized)  Difficulty in walking, not elsewhere classified  Acute pain of left knee  ONSET DATE: End of 2023  SUBJECTIVE:  SUBJECTIVE STATEMENT: Pt states he continues to feel better. He is trying to keep up his walking to 5-6 minutes at a time PERTINENT HISTORY:  R TKA in 2022, L TKA in 2023 From eval: Pt states he had a R TKA in 2022. Did L TKA summer of 2023. Pt reports he was getting swelling in his L knee and radiating pain into the left leg. Pt got imaging performed which demonstrated disc space loss at L5-S1. Pt states pain radiates from L hip down to leg. Pt states pain wakes him up in the middle of the night. Pt states pain is constant. Pt states side of his knee is a little numb but this may be due to the surgery. Does state knee buckles at times  PAIN:  Are you having pain? Yes: NPRS scale: 2 in knee  currently, 5/10 at worst/10 Pain location: L knee Pain description: throbbing/aching Aggravating factors: Walking (<5 min), sitting (5-10 min) Relieving factors: medication (to a certain degree), ice/heat   PRECAUTIONS: None  RED FLAGS: None   WEIGHT BEARING RESTRICTIONS: No  FALLS:  Has patient fallen in last 6 months? 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  Left knee x-ray 10/23/22 results pending  PATIENT SURVEYS:  FOTO 40; predicted 54     SENSATION: N/T L hip to lateral leg  MUSCLE LENGTH: Hamstrings: Right 80 deg; Left 70 deg Thomas test: Right 10 deg; Left 10 deg (pulls on L low back)  POSTURE: posterior pelvic tilt  PALPATION: TTP L piriformis and bilat QL  LUMBAR ROM:   AROM eval  Flexion 90% pain coming up  Extension 100% *  Right lateral flexion 100%  Left lateral flexion 100%  Right rotation 90% increases s/s  Left rotation 90% increases s/s   (Blank rows = not tested)    LOWER EXTREMITY MMT:    MMT Right eval Left eval Left 01/03/23  Hip flexion 4+ 4+ 4+ SLR with ER 4-/5  Hip extension 3+ 3+ 4-  Hip abduction 4+ 3+ 4-  Hip adduction     Hip internal rotation     Hip external rotation     Knee flexion 4 4- 4-  Knee extension 5 5 4   Ankle dorsiflexion     Ankle plantarflexion     Ankle inversion     Ankle eversion      (Blank rows = not tested)  LUMBAR SPECIAL TESTS:  Straight leg raise test: Positive, Slump test: Negative, Single leg stance test: Positive, FABER test: Positive, and Thomas test:  Positive  FUNCTIONAL TESTS:  5 times sit to stand: 15.26 sec  (eval) 14.28 seconds SLS: 5 sec on R, 4 sec on L (eval) Rt 10 sec, 19 sec SLS 02/07/23: Rt 20 seconds, Lt 11 seconds GAIT: WNL  [facility] Adult PT Treatment:                                                DATE: 02/07/23 Therapeutic  Exercise: Nustep L7 x 5 min warm up Step ups forward 6'' step 2 x 10 Step ups laterally 6'' step 2 x 10 Standing hip abd 2 x 10 bilat green TB Standing SLR green TB 2 x 10 bilat Standing hip ext green TB 2 x 10 bilat Heel raises x 20 Seated LAQ to HS curl green TB 3 x 10 SLS trials  Modalities: Vaso Lt knee x 10 minutes low pressure 34 degrees   [facility] Adult PT Treatment:                                                DATE: 01/31/23 Therapeutic Exercise: Nustep L7 x 5 min warm up Standing hip abd 2 x 10 bilat red TB Standing SLR red TB 2 x 10 bilat Standing hip ext red TB 2 x 10 bilat Heel raise x 20 bilat SLS repetitions 5 x STS (see results above) Walking x 6 minutes LAQ/HS curl x 10  Modalities: Vaso x 10 min Lt knee 34 degrees low pressure   [facility] Adult PT Treatment:                                                DATE: 01/26/23 Therapeutic Exercise: Nustep L7 x 5 min for warm up SLR 3 x 10 bilat SLR with ER 2 x 10 bilat Supine clam green TB 3 x 10 Bridge green TB 3 x 10 hip Adduction ball squeeze with alternating LAQ 2 x 10 LAQ/HS curl red TB 3 x 10  Modalities: Vaso x 10 min Lt knee 34 degrees low pressure   PATIENT EDUCATION:  Education details: Exam findings, POC, initial HEP Person educated: Patient Education method: Explanation, Demonstration, and Handouts Education comprehension: verbalized understanding, returned demonstration, and needs further education  HOME EXERCISE PROGRAM: Access Code: [email or web address] URL: [email or web address] Date: 01/03/2023 Prepared by: Reggy Eye  Exercises - Sitting Knee Extension with Resistance  - 1 x daily - 7 x  weekly - 3 sets - 10 reps - Seated Hamstring Curl with Anchored Resistance  - 1 x daily - 7 x weekly - 3 sets - 10 reps - Single Leg Stance with Support  - 1 x daily - 7 x weekly - 1 sets - 10 reps - 10 seconds hold - Straight Leg Raise with External Rotation  - 1 x daily - 7 x weekly - 2 sets - 10 reps - Sidelying Hip Abduction  - 1 x daily - 7 x weekly - 2 sets - 10 reps - Clamshell with Resistance  - 1 x daily - 7 x weekly - 2 sets - 10 reps  ASSESSMENT:  CLINICAL IMPRESSION: Pt returns to MD tomorrow for follow up. He has improved strength, activity tolerance and balance. He is independent with HEP and PT educated pt on importance of progressing walking time and continuing with strengthening exercises. Plan to hold PT at this time  From eval: Patient is a 62 y.o. M who was seen today for physical therapy evaluation and treatment for L lumbar radiculopathy. Assessment significant for L>R glute weakness, core weakness, piriformis tightness with radiating symptoms from L hip to lateral L leg. Increased symptom centralization when pt performed prone press up. Pt would benefit  from PT to address these deficits for improved overall function with work and community tasks. Pt states he will be on vacation next week -- will start PT when he returns.      GOALS: Goals reviewed with patient? Yes  SHORT TERM GOALS: Target date: 11/30/2022   Pt will be ind with initial HEP Baseline: Goal status: IN PROGRESS  2.  Pt will demo 5x STS of </=13 sec for increased functional LE strength Baseline:  Goal status: MET   LONG TERM GOALS: Target date: 02/14/2023    Pt will be ind with progression and management of HEP Baseline:  Goal status: INITIAL  2.  Pt will be able to walk at least 20 min with pain </=3/10 Baseline: 3-4 minutes Goal status: IN PROGRESS  3.  Pt will be able to sit to perform desk/work functions for at least 45 min with pain </=3/10 Baseline:  Goal status: MET  4.  Pt  will be able to perform SLS for at least 10 sec on R & L LE to demo improved LE/core stability in standing Baseline: 6 Lt LE, 3 Rt LE Goal status: UPGRADED - see goal 6  5.  Pt will have improved FOTO to >/=54 Baseline:  Goal status: MET 6.  Pt will improve SLS >= 15 seconds bilat to demo improved balance Baseline:  Goal status: INITIAL    PLAN:  PT FREQUENCY: 2x/week   PT DURATION: 6 weeks  PLANNED INTERVENTIONS: Therapeutic exercises, Therapeutic activity, Neuromuscular re-education, Balance training, Gait training, Patient/Family education, Self Care, Joint mobilization, Stair training, Aquatic Therapy, Dry Needling, Electrical stimulation, Spinal mobilization, Cryotherapy, Moist heat, Taping, Vasopneumatic device, Ionotophoresis 4mg /ml Dexamethasone, Manual therapy, and Re-evaluation.  PLAN FOR NEXT SESSION:  hold PT at this time  PHYSICAL THERAPY DISCHARGE SUMMARY  Visits from Start of Care: 10  Current functional level related to goals / functional outcomes: Improving strength, mobility and balance   Remaining deficits: See above   Education / Equipment: HEP   Patient agrees to discharge. Patient goals were partially met. Patient is being discharged due to not returning since the last visit.  Reggy Eye, PT,DPT02/21/2510:04 AM   Reggy Eye, PT 02/07/2023, 12:21 PM

## 2023-02-08 ENCOUNTER — Encounter: Payer: 59 | Admitting: Physical Medicine & Rehabilitation

## 2023-02-08 ENCOUNTER — Ambulatory Visit: Payer: 59 | Admitting: Sports Medicine

## 2023-02-08 ENCOUNTER — Encounter: Payer: Self-pay | Admitting: Sports Medicine

## 2023-02-08 DIAGNOSIS — M25562 Pain in left knee: Secondary | ICD-10-CM

## 2023-02-08 DIAGNOSIS — M76892 Other specified enthesopathies of left lower limb, excluding foot: Secondary | ICD-10-CM

## 2023-02-08 DIAGNOSIS — G8929 Other chronic pain: Secondary | ICD-10-CM

## 2023-02-08 DIAGNOSIS — Z96652 Presence of left artificial knee joint: Secondary | ICD-10-CM

## 2023-02-08 NOTE — Progress Notes (Signed)
Patient says that he is doing well and feels as though he is moving in the right direction. He has been doing physical therapy and home exercises. He says that he takes one Meloxicam and one Gabapentin per day, as well as three Tylenol over the course of the day and one Asprin.

## 2023-02-08 NOTE — Progress Notes (Signed)
Ernest Navarro. - 62 y.o. male MRN [id number]  Date of birth: [birth year]  Office Visit Note: Visit Date: 02/08/2023 PCP: Suzan Slick, MD Referred by: Suzan Slick, MD  Subjective: Chief Complaint  Patient presents with   Left Knee - Follow-up   HPI: Ernest Bloyd. is a pleasant 62 y.o. male who presents today for left quadricep pain s/p L-TKA 09/20/21.   Ernest Navarro tells me today he is doing significantly better with his knee pain as well as his strength.  We did do a few sessions of extracorporeal shockwave therapy to break up the scar tissue and to treat his quadricep tendinopathy which has been helpful.  He is continuing physical therapy which she is finding very beneficial.  He has much less swelling in the leg.  He is hoping to do a few more sessions of formalized physical therapy given his improvement and benefit from this.  Pertinent ROS were reviewed with the patient and found to be negative unless otherwise specified above in HPI.   Assessment & Plan: Visit Diagnoses:  1. Tendinitis of left quadriceps tendon   2. S/P total knee arthroplasty, left   3. Chronic pain of left knee    Plan: Kenan has made excellent improvement in his pain and function with the knee status post his knee replacement and quadricep tendinitis.  I did send a new referral for physical therapy so we can continue a few additional sessions given his marked improvement with this.  He will work on weaning off his medication, he will start first with weaning off of his gabapentin and transitioning his Celebrex to only daily as needed.  Use compression sleeves to help with his leg swelling.  He will see me on an as-needed basis.  If he wishes to be reevaluated or pursue a few additional treatments of extracorporeal shockwave therapy, he will notify me.  Follow-up: Return if symptoms worsen or fail to improve.   Meds & Orders: No orders of the defined types were placed in this encounter.   Orders  Placed This Encounter  Procedures   Ambulatory referral to Physical Therapy     Procedures: No procedures performed      Clinical History: No specialty comments available.  He reports that he has never smoked. He has never used smokeless tobacco.  Recent Labs    04/05/22 0945 08/22/22 1630  HGBA1C 7.6* 8.4*    Objective:    Physical Exam  Gen: Well-appearing, in no acute distress; non-toxic CV:  Well-perfused. Warm.  Resp: Breathing unlabored on room air; no wheezing. Psych: Fluid speech in conversation; appropriate affect; normal thought process Neuro: Sensation intact throughout. No gross coordination deficits.   Ortho Exam - L knee: No tenderness over the distal quad or the knee joint itself.  Well-healed incision from prior TKA.  There is markedly improved edema and no real effusion within the knee today.  Imaging: No results found.  Past Medical/Family/Surgical/Social History: Medications & Allergies reviewed per EMR, new medications updated. 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   S/P total knee arthroplasty, right 10/28/2020   Type 2 diabetes mellitus without complication, without long-term current use of insulin (HCC) 05/15/2019   Hypotestosteronism 05/13/2019   Class 3 severe obesity due to excess calories with serious comorbidity and body mass index (BMI) of 40.0 to 44.9 in adult [facility]) 11/13/2018   Other  male erectile dysfunction 05/25/2017   Lichen planus 05/27/2015   Obstructive sleep apnea syndrome 05/06/2015   Gout 06/19/2012   Benign essential hypertension 06/19/2012   Past Medical History:  Diagnosis Date   Arthritis    bilateral knees   Class 3 severe obesity due to excess calories with serious comorbidity and body mass index (BMI) of 40.0 to 44.9 in adult [facility]) 11/13/2018   Essential hypertension, benign 06/19/2012   Obstructive sleep apnea syndrome  05/06/2015   Formatting of this note might be different from the original. Overview: High probability diagnosis based on previous reported sleep study and weight gain with witnessed apneas and loud snoring. We discussed the basic physiology and medical concerns. Plan-schedule sleep study, emphasize driving responsibility and advise weight loss Last Assessment & Plan: Formatting of this note might be different    Other male erectile dysfunction 05/25/2017   Sleep apnea    Type 2 diabetes mellitus without complication, without long-term current use of insulin (HCC) 05/15/2019   Family History  Problem Relation Age of Onset   Lymphoma Brother    Diabetes Maternal Grandmother    Colon cancer Neg Hx    Pancreatic cancer Neg Hx    Stomach cancer Neg Hx    Esophageal cancer Neg Hx    Liver disease Neg Hx    Past Surgical History:  Procedure Laterality Date   KNEE SURGERY Right    x3 arthroscopies   TOTAL KNEE ARTHROPLASTY Right 10/28/2020   Procedure: RIGHT TOTAL KNEE ARTHROPLASTY;  Surgeon: Cammy Copa, MD;  Location: [facility] OR;  Service: Orthopedics;  Laterality: Right;   TOTAL KNEE ARTHROPLASTY Left 09/05/2021   Procedure: LEFT TOTAL KNEE ARTHROPLASTY;  Surgeon: Cammy Copa, MD;  Location: [facility] OR;  Service: Orthopedics;  Laterality: Left;   Social History   Occupational History   Not on file  Tobacco Use   Smoking status: Never   Smokeless tobacco: Never  Vaping Use   Vaping status: Never Used  Substance and Sexual Activity   Alcohol use: No    Alcohol/week: 0.0 standard drinks of alcohol   Drug use: No   Sexual activity: Not on file

## 2023-02-14 ENCOUNTER — Ambulatory Visit: Payer: 59 | Admitting: Physical Therapy

## 2023-02-16 ENCOUNTER — Ambulatory Visit: Payer: 59 | Admitting: Family Medicine

## 2023-02-27 NOTE — Patient Instructions (Signed)
DUE TO COVID-19 ONLY TWO VISITORS  (aged 62 and older)  ARE ALLOWED TO COME WITH YOU AND STAY IN THE WAITING ROOM ONLY DURING PRE OP AND PROCEDURE.   **NO VISITORS ARE ALLOWED IN THE SHORT STAY AREA OR RECOVERY ROOM!!**  IF YOU WILL BE ADMITTED INTO THE HOSPITAL YOU ARE ALLOWED ONLY FOUR SUPPORT PEOPLE DURING VISITATION HOURS ONLY (7 AM -8PM)   The support person(s) must pass our screening, gel in and out, and wear a mask at all times, including in the patient's room. Patients must also wear a mask when staff or their support person are in the room. Visitors GUEST BADGE MUST BE WORN VISIBLY  One adult visitor may remain with you overnight and MUST be in the room by 8 P.M.     Your procedure is scheduled on: 03/16/23   Report to [facility] Main Entrance    Report to admitting at : 5:15 AM   Call this number if you have problems the morning of surgery [phone]   Do not eat food and drink fluids : After Midnight.  FOLLOW ANY ADDITIONAL PRE OP INSTRUCTIONS YOU RECEIVED FROM YOUR SURGEON'S OFFICE!!!   Oral Hygiene is also important to reduce your risk of infection.                                    Remember - BRUSH YOUR TEETH THE MORNING OF SURGERY WITH YOUR REGULAR TOOTHPASTE  DENTURES WILL BE REMOVED PRIOR TO SURGERY PLEASE DO NOT APPLY "Poly grip" OR ADHESIVES!!!   Do NOT smoke after Midnight   Take these medicines the morning of surgery with A SIP OF WATER: gabapentin,amlodipine.Tylenol as needed.  How to Manage Your Diabetes Before and After Surgery  Why is it important to control my blood sugar before and after surgery? Improving blood sugar levels before and after surgery helps healing and can limit problems. A way of improving blood sugar control is eating a healthy diet by:  Eating less sugar and carbohydrates  Increasing activity/exercise  Talking with your doctor about reaching your blood sugar goals High blood sugars (greater than 180 mg/dL) can raise  your risk of infections and slow your recovery, so you will need to focus on controlling your diabetes during the weeks before surgery. Make sure that the doctor who takes care of your diabetes knows about your planned surgery including the date and location.  How do I manage my blood sugar before surgery? Check your blood sugar at least 4 times a day, starting 2 days before surgery, to make sure that the level is not too high or low. Check your blood sugar the morning of your surgery when you wake up and every 2 hours until you get to the Short Stay unit. If your blood sugar is less than 70 mg/dL, you will need to treat for low blood sugar: Do not take insulin. Treat a low blood sugar (less than 70 mg/dL) with  cup of clear juice (cranberry or apple), 4 glucose tablets, OR glucose gel. Recheck blood sugar in 15 minutes after treatment (to make sure it is greater than 70 mg/dL). If your blood sugar is not greater than 70 mg/dL on recheck, call [phone] for further instructions. Report your blood sugar to the short stay nurse when you get to Short Stay.  If you are admitted to the hospital after surgery: Your blood sugar will be checked  by the staff and you will probably be given insulin after surgery (instead of oral diabetes medicines) to make sure you have good blood sugar levels. The goal for blood sugar control after surgery is 80-180 mg/dL.   WHAT DO I DO ABOUT MY DIABETES MEDICATION?  THE MORNING OF SURGERY,DO NOT TAKE ANY ORAL DIABETIC MEDICATIONS DAY OF YOUR SURGERY  DO NOT TAKE THE FOLLOWING 7 DAYS PRIOR TO SURGERY: Ozempic, Wegovy, Rybelsus (Semaglutide), Byetta (exenatide), Bydureon (exenatide ER), Victoza, Saxenda (liraglutide), or Trulicity (dulaglutide) Mounjaro (Tirzepatide) Adlyxin (Lixisenatide), Polyethylene Glycol Loxenatide. HOLD semaglutide after 03/08/23  Bring CPAP mask and tubing day of surgery.                              You may not have any metal on your body  including hair pins, jewelry, and body piercing             Do not wear lotions, powders, perfumes/cologne, or deodorant              Men may shave face and neck.   Do not bring valuables to the hospital. [facility] IS NOT             RESPONSIBLE   FOR VALUABLES.   Contacts, glasses, or bridgework may not be worn into surgery.   Bring small overnight bag day of surgery.   DO NOT BRING YOUR HOME MEDICATIONS TO THE HOSPITAL. PHARMACY WILL DISPENSE MEDICATIONS LISTED ON YOUR MEDICATION LIST TO YOU DURING YOUR ADMISSION IN THE HOSPITAL!    Patients discharged on the day of surgery will not be allowed to drive home.  Someone NEEDS to stay with you for the first 24 hours after anesthesia.   Special Instructions: Bring a copy of your healthcare power of attorney and living will documents         the day of surgery if you haven't scanned them before.              Please read over the following fact sheets you were given: IF YOU HAVE QUESTIONS ABOUT YOUR PRE-OP INSTRUCTIONS PLEASE CALL [phone]    [facility] - Preparing for Surgery Before surgery, you can play an important role.  Because skin is not sterile, your skin needs to be as free of germs as possible.  You can reduce the number of germs on your skin by washing with CHG (chlorahexidine gluconate) soap before surgery.  CHG is an antiseptic cleaner which kills germs and bonds with the skin to continue killing germs even after washing. Please DO NOT use if you have an allergy to CHG or antibacterial soaps.  If your skin becomes reddened/irritated stop using the CHG and inform your nurse when you arrive at Short Stay. Do not shave (including legs and underarms) for at least 48 hours prior to the first CHG shower.  You may shave your face/neck. Please follow these instructions carefully:  1.  Shower with CHG Soap the night before surgery and the  morning of Surgery.  2.  If you choose to wash your hair, wash your hair first as usual with  your  normal  shampoo.  3.  After you shampoo, rinse your hair and body thoroughly to remove the  shampoo.                           4.  Use CHG as you would  any other liquid soap.  You can apply chg directly  to the skin and wash                       Gently with a scrungie or clean washcloth.  5.  Apply the CHG Soap to your body ONLY FROM THE NECK DOWN.   Do not use on face/ open                           Wound or open sores. Avoid contact with eyes, ears mouth and genitals (private parts).                       Wash face,  Genitals (private parts) with your normal soap.             6.  Wash thoroughly, paying special attention to the area where your surgery  will be performed.  7.  Thoroughly rinse your body with warm water from the neck down.  8.  DO NOT shower/wash with your normal soap after using and rinsing off  the CHG Soap.                9.  Pat yourself dry with a clean towel.            10.  Wear clean pajamas.            11.  Place clean sheets on your bed the night of your first shower and do not  sleep with pets. Day of Surgery : Do not apply any lotions/deodorants the morning of surgery.  Please wear clean clothes to the hospital/surgery center.  FAILURE TO FOLLOW THESE INSTRUCTIONS MAY RESULT IN THE CANCELLATION OF YOUR SURGERY PATIENT SIGNATURE_________________________________  NURSE SIGNATURE__________________________________  ________________________________________________________________________

## 2023-02-28 ENCOUNTER — Encounter (HOSPITAL_COMMUNITY)
Admission: RE | Admit: 2023-02-28 | Discharge: 2023-02-28 | Disposition: A | Discharge status: Home / Self Care | Payer: 59 | Source: Ambulatory Visit | Attending: Surgery | Admitting: Surgery

## 2023-02-28 ENCOUNTER — Encounter (HOSPITAL_COMMUNITY): Payer: Self-pay

## 2023-02-28 ENCOUNTER — Other Ambulatory Visit: Payer: Self-pay

## 2023-02-28 VITALS — BP 159/104 | HR 103 | Temp 98.0°F | Ht 73.0 in | Wt 323.0 lb

## 2023-02-28 DIAGNOSIS — E119 Type 2 diabetes mellitus without complications: Secondary | ICD-10-CM | POA: Diagnosis not present

## 2023-02-28 DIAGNOSIS — I10 Essential (primary) hypertension: Secondary | ICD-10-CM | POA: Insufficient documentation

## 2023-02-28 DIAGNOSIS — Z01818 Encounter for other preprocedural examination: Secondary | ICD-10-CM | POA: Diagnosis present

## 2023-02-28 LAB — HEMOGLOBIN A1C
Hgb A1c MFr Bld: 7.6 % — ABNORMAL HIGH (ref 4.8–5.6)
Mean Plasma Glucose: 171.42 mg/dL

## 2023-02-28 LAB — BASIC METABOLIC PANEL
Anion gap: 8 (ref 5–15)
BUN: 8 mg/dL (ref 8–23)
CO2: 25 mmol/L (ref 22–32)
Calcium: 10.6 mg/dL — ABNORMAL HIGH (ref 8.9–10.3)
Chloride: 104 mmol/L (ref 98–111)
Creatinine, Ser: 0.9 mg/dL (ref 0.61–1.24)
GFR, Estimated: >60 mL/min (ref >60)
Glucose, Bld: 124 mg/dL — ABNORMAL HIGH (ref 70–99)
Potassium: 3.8 mmol/L (ref 3.5–5.1)
Sodium: 137 mmol/L (ref 135–145)

## 2023-02-28 LAB — CBC
HCT: 50.9 % (ref 39.0–52.0)
Hemoglobin: 15.1 g/dL (ref 13.0–17.0)
MCH: 25.1 pg — ABNORMAL LOW (ref 26.0–34.0)
MCHC: 29.7 g/dL — ABNORMAL LOW (ref 30.0–36.0)
MCV: 84.7 fL (ref 80.0–100.0)
Platelets: 228 10*3/uL (ref 150–400)
RBC: 6.01 MIL/uL — ABNORMAL HIGH (ref 4.22–5.81)
RDW: 15 % (ref 11.5–15.5)
WBC: 10.9 10*3/uL — ABNORMAL HIGH (ref 4.0–10.5)
nRBC: 0 % (ref 0.0–0.2)

## 2023-02-28 LAB — GLUCOSE, CAPILLARY: Glucose-Capillary: 145 mg/dL — ABNORMAL HIGH (ref 70–99)

## 2023-02-28 NOTE — Progress Notes (Addendum)
For Anesthesia: PCP - Suzan Slick, MD  Cardiologist - N/A  Bowel Prep reminder:  Chest x-ray -  EKG - 02/28/23 Stress Test -  ECHO -  Cardiac Cath -  Pacemaker/ICD device last checked: Pacemaker orders received: Device Rep notified:  Spinal Cord Stimulator:N/A  Sleep Study - Yes CPAP - NO  Fasting Blood Sugar - N/A Checks Blood Sugar ___0__ times a day Date and result of last Hgb A1c- 8.4: 08/22/22  Last dose of GLP1 agonist- semaglutide GLP1 instructions: To hold it after: 03/08/23  Last dose of SGLT-2 inhibitors- N/A SGLT-2 instructions:   Blood Thinner Instructions:N/A Aspirin Instructions: pt. Will hold it 5 days before surgery. Last Dose:  Activity level: Can go up a flight of stairs and activities of daily living without stopping and without chest pain and/or shortness of breath      Unable to go up a flight of stairs shortness of breath    Anesthesia review: Hx: DIA,HTN,OSA(NO CPAP),Severe obesity. BP during PST appointment: 183/110 P: 101: 159/104 P: 103. As per pt. Those are his usual numbers.He took his BP medicines this AM.  Patient denies shortness of breath, fever, cough and chest pain at PAT appointment   Patient verbalized understanding of instructions that were given to them at the PAT appointment. Patient was also instructed that they will need to review over the PAT instructions again at home before surgery.

## 2023-03-05 ENCOUNTER — Encounter (HOSPITAL_COMMUNITY): Payer: Self-pay | Admitting: Surgery

## 2023-03-05 DIAGNOSIS — E210 Primary hyperparathyroidism: Secondary | ICD-10-CM | POA: Diagnosis present

## 2023-03-05 NOTE — H&P (Signed)
REFERRING PHYSICIAN: Dennie Maizes, NP  PROVIDER: Shaft Corigliano Myra Rude, MD   Chief Complaint: New Consultation (Primary hyperparathyroidism)  History of Present Illness:  Patient is referred by Dennie Maizes, NP, for surgical evaluation and management of newly diagnosed primary hyperparathyroidism. Patient was noted on routine laboratory studies to have an elevated calcium level of 11.8. Subsequent intact PTH level was found to be elevated at 143. Ionized calcium level is elevated at 6.0. Patient underwent a nuclear medicine parathyroid scan on November 21, 2022. This demonstrated a left inferior parathyroid adenoma. Patient has noted fatigue. He has had bone and joint discomfort. He has undergone bilateral knee replacement. He notes urinary frequency. He has no history of nephrolithiasis. He has had no prior head or neck surgery. There is no family history of endocrine disease or endocrine neoplasms. Patient presents today to discuss parathyroid surgery.  Review of Systems: A complete review of systems was obtained from the patient. I have reviewed this information and discussed as appropriate with the patient. See HPI as well for other ROS.  Review of Systems  Constitutional: Positive for malaise/fatigue.  HENT: Negative.  Eyes: Negative.  Respiratory: Negative.  Cardiovascular: Negative.  Gastrointestinal: Negative.  Genitourinary: Positive for frequency.  Musculoskeletal: Positive for joint pain.  Skin: Negative.  Neurological: Negative.  Endo/Heme/Allergies: Negative.  Psychiatric/Behavioral: Negative.    Medical History: Past Medical History:  Diagnosis Date  Arthritis  Class 3 severe obesity due to excess calories in adult (CMS/HHS-HCC)  Hypertension  OSA (obstructive sleep apnea)  Type 2 diabetes mellitus (CMS/HHS-HCC)   Patient Active Problem List  Diagnosis  Benign essential hypertension  Class 3 severe obesity due to excess calories with serious comorbidity  and body mass index (BMI) of 40.0 to 44.9 in adult (CMS/HHS-HCC)  Gout  Hyperglycemia  Hypotestosteronism  Impaired functional mobility, balance, gait, and endurance  Obstructive sleep apnea syndrome  Type 2 diabetes mellitus without complication, without long-term current use of insulin (CMS/HHS-HCC)  Presence of left artificial knee joint  Primary hyperparathyroidism (CMS/HHS-HCC)   Past Surgical History:  Procedure Laterality Date  ARTHROPLASTY TOTAL KNEE Bilateral    Allergies  Allergen Reactions  Lisinopril Other (See Comments)  Headache  Metformin Nausea  Oxycodone Nausea  Makes him "delirious"  Other Reaction(s): delusional   Current Outpatient Medications on File Prior to Visit  Medication Sig Dispense Refill  amLODIPine (NORVASC) 10 MG tablet Take 10 mg by mouth once daily  celecoxib (CELEBREX) 100 MG capsule 1 capsule with food Orally Once a day  gabapentin (NEURONTIN) 300 MG capsule Take 1 capsule by mouth once daily  glipiZIDE (GLUCOTROL) 10 MG tablet Take 10 mg by mouth every morning before breakfast (0630)  losartan (COZAAR) 50 MG tablet 1 tablet Orally Once a day   No current facility-administered medications on file prior to visit.   Family History  Problem Relation Age of Onset  Lymphoma Brother  Diabetes Maternal Grandmother    Social History   Tobacco Use  Smoking Status Never  Smokeless Tobacco Never    Social History   Socioeconomic History  Marital status: Single  Tobacco Use  Smoking status: Never  Smokeless tobacco: Never  Substance and Sexual Activity  Alcohol use: Never  Drug use: Never   Social Drivers of Health   Received from [employer]  Social Network   Objective:   Vitals:  BP: (!) 132/98  Pulse: 70  Temp: 36.7 C (98.1 F)  SpO2: 98%  Weight: (!) 147.1 kg (324 lb  3.2 oz)  Height: 182.9 cm (6')  PainSc: 0-No pain   Body mass index is 43.97 kg/m.  Physical Exam   GENERAL APPEARANCE Comfortable, no acute  issues Development: normal Gross deformities: none  SKIN Rash, lesions, ulcers: none Induration, erythema: none Nodules: none palpable  EYES Conjunctiva and lids: normal Pupils: equal and reactive  EARS, NOSE, MOUTH, THROAT External ears: no lesion or deformity External nose: no lesion or deformity Hearing: grossly normal  NECK Symmetric: yes Trachea: midline Thyroid: no palpable nodules in the thyroid bed  ABDOMEN Not assessed  GENITOURINARY/RECTAL Not assessed  MUSCULOSKELETAL Station and gait: normal Digits and nails: no clubbing or cyanosis Muscle strength: grossly normal all extremities Range of motion: grossly normal all extremities Deformity: none  LYMPHATIC Cervical: none palpable Supraclavicular: none palpable  PSYCHIATRIC Oriented to person, place, and time: yes Mood and affect: normal for situation Judgment and insight: appropriate for situation   Assessment and Plan:   Primary hyperparathyroidism (CMS/HHS-HCC)  Patient is referred by his endocrinologist for surgical evaluation and management of primary hyperparathyroidism.  Patient provided with a copy of "Parathyroid Surgery: Treatment for Your Parathyroid Gland Problem", published by [employer], 12 pages. Book reviewed and explained to patient during visit today.  Today we reviewed his clinical history. We reviewed his recent laboratory studies. We reviewed the results of his nuclear medicine parathyroid scan with sestamibi. This localizes a left inferior parathyroid adenoma. The patient has been symptomatic.  I would like to obtain an ultrasound examination of the neck to complete his workup and to make sure there is no evidence of concurrent thyroid disease.  Today we discussed minimally invasive parathyroidectomy. We discussed the risk and benefits of the surgery. We discussed the size and location of the surgical incision. We discussed the risk of recurrent laryngeal nerve injury. We discussed  his postoperative recovery and return to activities. The patient understands and has requested an overnight stay as he lives alone and he does have multiple comorbidities. We will request an overnight observation bed following his procedure.   Darnell Level, MD [facility] practice Office: [phone]

## 2023-03-15 NOTE — Anesthesia Preprocedure Evaluation (Addendum)
Anesthesia Evaluation  Patient identified by MRN, date of birth, ID band Patient awake    Reviewed: Allergy & Precautions, NPO status , Patient's Chart, lab work & pertinent test results  Airway Mallampati: II  TM Distance: >3 FB Neck ROM: Full    Dental no notable dental hx.    Pulmonary sleep apnea    Pulmonary exam normal        Cardiovascular hypertension, Pt. on medications  Rhythm:Regular Rate:Normal     Neuro/Psych    GI/Hepatic Neg liver ROS,,,  Endo/Other  diabetes, Type 2, Oral Hypoglycemic Agents    Renal/GU      Musculoskeletal  (+) Arthritis , Osteoarthritis,    Abdominal Normal abdominal exam  (+)   Peds  Hematology Lab Results      Component                Value               Date                      WBC                      10.9 (H)            02/28/2023                HGB                      15.1                02/28/2023                HCT                      50.9                02/28/2023                MCV                      84.7                02/28/2023                PLT                      228                 02/28/2023              Anesthesia Other Findings   Reproductive/Obstetrics                             Anesthesia Physical Anesthesia Plan  ASA: 2  Anesthesia Plan: General   Post-op Pain Management: Celebrex PO (pre-op)* and Tylenol PO (pre-op)*   Induction: Intravenous  PONV Risk Score and Plan: 2 and Ondansetron, Dexamethasone, Midazolam and Treatment may vary due to age or medical condition  Airway Management Planned: Mask and Oral ETT  Additional Equipment: None  Intra-op Plan:   Post-operative Plan: Extubation in OR  Informed Consent: I have reviewed the patients History and Physical, chart, labs and discussed the procedure including the risks, benefits and alternatives for the proposed anesthesia with the patient or authorized  representative who has indicated his/her understanding and acceptance.  Dental advisory given  Plan Discussed with: CRNA  Anesthesia Plan Comments:        Anesthesia Quick Evaluation

## 2023-03-16 ENCOUNTER — Encounter (HOSPITAL_COMMUNITY)
Admission: RE | Disposition: A | Discharge status: Home / Self Care | Payer: Self-pay | Source: Home / Self Care | Attending: Surgery

## 2023-03-16 ENCOUNTER — Encounter (HOSPITAL_COMMUNITY): Payer: Self-pay | Admitting: Surgery

## 2023-03-16 ENCOUNTER — Other Ambulatory Visit: Payer: Self-pay

## 2023-03-16 ENCOUNTER — Other Ambulatory Visit: Payer: Self-pay | Admitting: Family Medicine

## 2023-03-16 ENCOUNTER — Ambulatory Visit (HOSPITAL_COMMUNITY): Payer: 59 | Admitting: Physician Assistant

## 2023-03-16 ENCOUNTER — Ambulatory Visit (HOSPITAL_COMMUNITY)
Admission: RE | Admit: 2023-03-16 | Discharge: 2023-03-17 | Disposition: A | Discharge status: Home / Self Care | Payer: 59 | Attending: Surgery | Admitting: Surgery

## 2023-03-16 DIAGNOSIS — E119 Type 2 diabetes mellitus without complications: Secondary | ICD-10-CM | POA: Insufficient documentation

## 2023-03-16 DIAGNOSIS — Z791 Long term (current) use of non-steroidal anti-inflammatories (NSAID): Secondary | ICD-10-CM | POA: Insufficient documentation

## 2023-03-16 DIAGNOSIS — M1990 Unspecified osteoarthritis, unspecified site: Secondary | ICD-10-CM | POA: Diagnosis not present

## 2023-03-16 DIAGNOSIS — G4733 Obstructive sleep apnea (adult) (pediatric): Secondary | ICD-10-CM | POA: Insufficient documentation

## 2023-03-16 DIAGNOSIS — Z7984 Long term (current) use of oral hypoglycemic drugs: Secondary | ICD-10-CM | POA: Insufficient documentation

## 2023-03-16 DIAGNOSIS — E210 Primary hyperparathyroidism: Secondary | ICD-10-CM | POA: Diagnosis present

## 2023-03-16 DIAGNOSIS — Z79899 Other long term (current) drug therapy: Secondary | ICD-10-CM | POA: Diagnosis not present

## 2023-03-16 DIAGNOSIS — Z6841 Body Mass Index (BMI) 40.0 and over, adult: Secondary | ICD-10-CM | POA: Diagnosis not present

## 2023-03-16 DIAGNOSIS — D351 Benign neoplasm of parathyroid gland: Secondary | ICD-10-CM | POA: Diagnosis not present

## 2023-03-16 DIAGNOSIS — I10 Essential (primary) hypertension: Secondary | ICD-10-CM | POA: Diagnosis not present

## 2023-03-16 HISTORY — PX: PARATHYROIDECTOMY: SHX19

## 2023-03-16 LAB — GLUCOSE, CAPILLARY
Glucose-Capillary: 172 mg/dL — ABNORMAL HIGH (ref 70–99)
Glucose-Capillary: 149 mg/dL — ABNORMAL HIGH (ref 70–99)
Glucose-Capillary: 156 mg/dL — ABNORMAL HIGH (ref 70–99)
Glucose-Capillary: 197 mg/dL — ABNORMAL HIGH (ref 70–99)
Glucose-Capillary: 207 mg/dL — ABNORMAL HIGH (ref 70–99)
Glucose-Capillary: 158 mg/dL — ABNORMAL HIGH (ref 70–99)

## 2023-03-16 SURGERY — PARATHYROIDECTOMY
Anesthesia: General

## 2023-03-16 MED ORDER — FENTANYL CITRATE PF 50 MCG/ML IJ SOSY
PREFILLED_SYRINGE | INTRAMUSCULAR | Status: AC
  Administered 2023-03-16: 25 ug via INTRAVENOUS
  Filled 2023-03-16: qty 1

## 2023-03-16 MED ORDER — TRAMADOL HCL 50 MG PO TABS
50.0000 mg | ORAL_TABLET | Freq: Four times a day (QID) | ORAL | Status: DC | PRN
  Administered 2023-03-17: 50 mg via ORAL
  Filled 2023-03-16: qty 1

## 2023-03-16 MED ORDER — LIDOCAINE HCL (PF) 2 % IJ SOLN
INTRAMUSCULAR | Status: AC
  Filled 2023-03-16: qty 5

## 2023-03-16 MED ORDER — LACTATED RINGERS IV SOLN: INTRAVENOUS | Status: DC

## 2023-03-16 MED ORDER — ORAL CARE MOUTH RINSE: 15.0000 mL | Freq: Once | OROMUCOSAL | Status: AC

## 2023-03-16 MED ORDER — MIDAZOLAM HCL 5 MG/5ML IJ SOLN
INTRAMUSCULAR | Status: DC | PRN
  Administered 2023-03-16: 2 mg via INTRAVENOUS

## 2023-03-16 MED ORDER — AMLODIPINE BESYLATE 10 MG PO TABS
10.0000 mg | ORAL_TABLET | Freq: Every day | ORAL | Status: DC
  Administered 2023-03-17: 10 mg via ORAL
  Filled 2023-03-16: qty 1

## 2023-03-16 MED ORDER — HEMOSTATIC AGENTS (NO CHARGE) OPTIME
TOPICAL | Status: DC | PRN
  Administered 2023-03-16: 1 via TOPICAL

## 2023-03-16 MED ORDER — LOSARTAN POTASSIUM 50 MG PO TABS
50.0000 mg | ORAL_TABLET | Freq: Every day | ORAL | Status: DC
  Administered 2023-03-16 – 2023-03-17 (×2): 50 mg via ORAL
  Filled 2023-03-16 (×2): qty 1

## 2023-03-16 MED ORDER — ROCURONIUM BROMIDE 10 MG/ML (PF) SYRINGE
PREFILLED_SYRINGE | INTRAVENOUS | Status: AC
  Filled 2023-03-16: qty 10

## 2023-03-16 MED ORDER — ONDANSETRON 4 MG PO TBDP: 4.0000 mg | ORAL_TABLET | Freq: Four times a day (QID) | ORAL | Status: DC | PRN

## 2023-03-16 MED ORDER — MELATONIN 5 MG PO TABS
5.0000 mg | ORAL_TABLET | Freq: Every day | ORAL | Status: DC
  Filled 2023-03-16: qty 1

## 2023-03-16 MED ORDER — PROPOFOL 10 MG/ML IV BOLUS
INTRAVENOUS | Status: AC
  Filled 2023-03-16: qty 20

## 2023-03-16 MED ORDER — ROCURONIUM BROMIDE 10 MG/ML (PF) SYRINGE
PREFILLED_SYRINGE | INTRAVENOUS | Status: DC | PRN
  Administered 2023-03-16: 70 mg via INTRAVENOUS

## 2023-03-16 MED ORDER — GLIPIZIDE 10 MG PO TABS
10.0000 mg | ORAL_TABLET | Freq: Every day | ORAL | Status: DC
  Administered 2023-03-17: 10 mg via ORAL
  Filled 2023-03-16: qty 1

## 2023-03-16 MED ORDER — CELECOXIB 100 MG PO CAPS
100.0000 mg | ORAL_CAPSULE | Freq: Every day | ORAL | Status: DC
  Administered 2023-03-17: 100 mg via ORAL
  Filled 2023-03-16: qty 1

## 2023-03-16 MED ORDER — PHENYLEPHRINE HCL-NACL 20-0.9 MG/250ML-% IV SOLN
INTRAVENOUS | Status: DC | PRN
  Administered 2023-03-16: 25 ug/min via INTRAVENOUS

## 2023-03-16 MED ORDER — CEFAZOLIN IN SODIUM CHLORIDE 3-0.9 GM/100ML-% IV SOLN
3.0000 g | INTRAVENOUS | Status: AC
  Administered 2023-03-16: 3 g via INTRAVENOUS
  Filled 2023-03-16: qty 100

## 2023-03-16 MED ORDER — TRAMADOL HCL 50 MG PO TABS: 50.0000 mg | ORAL_TABLET | Freq: Four times a day (QID) | ORAL | 0 refills | Status: DC | PRN

## 2023-03-16 MED ORDER — DEXAMETHASONE SODIUM PHOSPHATE 10 MG/ML IJ SOLN
INTRAMUSCULAR | Status: DC | PRN
  Administered 2023-03-16: 5 mg via INTRAVENOUS

## 2023-03-16 MED ORDER — BUPIVACAINE HCL (PF) 0.25 % IJ SOLN
INTRAMUSCULAR | Status: AC
  Filled 2023-03-16: qty 30

## 2023-03-16 MED ORDER — ONDANSETRON HCL 4 MG/2ML IJ SOLN
4.0000 mg | Freq: Four times a day (QID) | INTRAMUSCULAR | Status: DC | PRN
  Administered 2023-03-16: 4 mg via INTRAVENOUS
  Filled 2023-03-16: qty 2

## 2023-03-16 MED ORDER — 0.9 % SODIUM CHLORIDE (POUR BTL) OPTIME
TOPICAL | Status: DC | PRN
  Administered 2023-03-16: 1000 mL

## 2023-03-16 MED ORDER — DROPERIDOL 2.5 MG/ML IJ SOLN: 0.6250 mg | Freq: Once | INTRAMUSCULAR | Status: DC | PRN

## 2023-03-16 MED ORDER — MIDAZOLAM HCL 2 MG/2ML IJ SOLN
INTRAMUSCULAR | Status: AC
  Filled 2023-03-16: qty 2

## 2023-03-16 MED ORDER — GABAPENTIN 400 MG PO CAPS
400.0000 mg | ORAL_CAPSULE | Freq: Every day | ORAL | Status: DC
  Administered 2023-03-17: 400 mg via ORAL
  Filled 2023-03-16: qty 1

## 2023-03-16 MED ORDER — HYDROMORPHONE HCL 1 MG/ML IJ SOLN
1.0000 mg | INTRAMUSCULAR | Status: DC | PRN
  Administered 2023-03-16 (×4): 1 mg via INTRAVENOUS
  Filled 2023-03-16 (×4): qty 1

## 2023-03-16 MED ORDER — TRAMADOL HCL 50 MG PO TABS
ORAL_TABLET | ORAL | Status: AC
  Administered 2023-03-16: 50 mg via ORAL
  Filled 2023-03-16: qty 1

## 2023-03-16 MED ORDER — CHLORHEXIDINE GLUCONATE CLOTH 2 % EX PADS: 6.0000 | MEDICATED_PAD | Freq: Once | CUTANEOUS | Status: DC

## 2023-03-16 MED ORDER — SODIUM CHLORIDE 0.9 % IV SOLN: INTRAVENOUS | Status: DC

## 2023-03-16 MED ORDER — FENTANYL CITRATE (PF) 100 MCG/2ML IJ SOLN
INTRAMUSCULAR | Status: AC
  Filled 2023-03-16: qty 2

## 2023-03-16 MED ORDER — CHLORHEXIDINE GLUCONATE 0.12 % MT SOLN
15.0000 mL | Freq: Once | OROMUCOSAL | Status: AC
  Administered 2023-03-16: 15 mL via OROMUCOSAL

## 2023-03-16 MED ORDER — FENTANYL CITRATE PF 50 MCG/ML IJ SOSY
25.0000 ug | PREFILLED_SYRINGE | INTRAMUSCULAR | Status: DC | PRN
  Administered 2023-03-16 (×3): 25 ug via INTRAVENOUS

## 2023-03-16 MED ORDER — FENTANYL CITRATE PF 50 MCG/ML IJ SOSY
PREFILLED_SYRINGE | INTRAMUSCULAR | Status: AC
  Filled 2023-03-16: qty 1

## 2023-03-16 MED ORDER — ACETAMINOPHEN 650 MG RE SUPP: 650.0000 mg | Freq: Four times a day (QID) | RECTAL | Status: DC | PRN

## 2023-03-16 MED ORDER — PROPOFOL 10 MG/ML IV BOLUS
INTRAVENOUS | Status: DC | PRN
  Administered 2023-03-16: 250 mg via INTRAVENOUS

## 2023-03-16 MED ORDER — ACETAMINOPHEN 325 MG PO TABS
650.0000 mg | ORAL_TABLET | Freq: Four times a day (QID) | ORAL | Status: DC | PRN
  Administered 2023-03-16 – 2023-03-17 (×2): 650 mg via ORAL
  Filled 2023-03-16 (×2): qty 2

## 2023-03-16 MED ORDER — DEXAMETHASONE SODIUM PHOSPHATE 10 MG/ML IJ SOLN
INTRAMUSCULAR | Status: AC
  Filled 2023-03-16: qty 1

## 2023-03-16 MED ORDER — CELECOXIB 200 MG PO CAPS
200.0000 mg | ORAL_CAPSULE | Freq: Once | ORAL | Status: AC
  Administered 2023-03-16: 200 mg via ORAL
  Filled 2023-03-16: qty 1

## 2023-03-16 MED ORDER — INSULIN ASPART 100 UNIT/ML IJ SOLN
0.0000 [IU] | Freq: Three times a day (TID) | INTRAMUSCULAR | Status: DC
  Administered 2023-03-16: 3 [IU] via SUBCUTANEOUS
  Administered 2023-03-16: 5 [IU] via SUBCUTANEOUS
  Administered 2023-03-17: 3 [IU] via SUBCUTANEOUS

## 2023-03-16 MED ORDER — LIDOCAINE 2% (20 MG/ML) 5 ML SYRINGE
INTRAMUSCULAR | Status: DC | PRN
  Administered 2023-03-16: 80 mg via INTRAVENOUS

## 2023-03-16 MED ORDER — ONDANSETRON HCL 4 MG/2ML IJ SOLN
INTRAMUSCULAR | Status: AC
  Filled 2023-03-16: qty 2

## 2023-03-16 MED ORDER — FENTANYL CITRATE (PF) 100 MCG/2ML IJ SOLN
INTRAMUSCULAR | Status: DC | PRN
  Administered 2023-03-16 (×2): 100 ug via INTRAVENOUS

## 2023-03-16 MED ORDER — BUPIVACAINE HCL 0.25 % IJ SOLN
INTRAMUSCULAR | Status: DC | PRN
  Administered 2023-03-16: 10 mL

## 2023-03-16 MED ORDER — SUGAMMADEX SODIUM 200 MG/2ML IV SOLN
INTRAVENOUS | Status: DC | PRN
  Administered 2023-03-16: 300 mg via INTRAVENOUS

## 2023-03-16 MED ORDER — INSULIN ASPART 100 UNIT/ML IJ SOLN
0.0000 [IU] | INTRAMUSCULAR | Status: AC | PRN
  Administered 2023-03-16 (×2): 2 [IU] via SUBCUTANEOUS
  Filled 2023-03-16: qty 1

## 2023-03-16 MED ORDER — OXYCODONE HCL 5 MG PO TABS
5.0000 mg | ORAL_TABLET | ORAL | Status: DC | PRN
  Filled 2023-03-16: qty 2

## 2023-03-16 MED ORDER — ACETAMINOPHEN 500 MG PO TABS
1000.0000 mg | ORAL_TABLET | Freq: Once | ORAL | Status: AC
  Administered 2023-03-16: 1000 mg via ORAL
  Filled 2023-03-16: qty 2

## 2023-03-16 SURGICAL SUPPLY — 30 items
ATTRACTOMAT 16X20 MAGNETIC DRP (DRAPES) ×1 IMPLANT
BAG COUNTER SPONGE SURGICOUNT (BAG) ×1 IMPLANT
BLADE SURG 15 STRL LF DISP TIS (BLADE) ×1 IMPLANT
CHLORAPREP W/TINT 26 (MISCELLANEOUS) ×1 IMPLANT
CLIP TI MEDIUM 6 (CLIP) ×2 IMPLANT
CLIP TI WIDE RED SMALL 6 (CLIP) ×2 IMPLANT
COVER SURGICAL LIGHT HANDLE (MISCELLANEOUS) ×1 IMPLANT
DERMABOND ADVANCED .7 DNX12 (GAUZE/BANDAGES/DRESSINGS) ×1 IMPLANT
DRAPE LAPAROTOMY T 98X78 PEDS (DRAPES) ×1 IMPLANT
DRAPE UTILITY XL STRL (DRAPES) ×1 IMPLANT
ELECT REM PT RETURN 15FT ADLT (MISCELLANEOUS) ×1 IMPLANT
GAUZE 4X4 16PLY ~~LOC~~+RFID DBL (SPONGE) ×1 IMPLANT
GLOVE SURG ORTHO 8.0 STRL STRW (GLOVE) ×1 IMPLANT
GOWN STRL REUS W/ TWL XL LVL3 (GOWN DISPOSABLE) ×3 IMPLANT
HEMOSTAT SURGICEL 2X4 FIBR (HEMOSTASIS) ×1 IMPLANT
ILLUMINATOR WAVEGUIDE N/F (MISCELLANEOUS) IMPLANT
KIT BASIN OR (CUSTOM PROCEDURE TRAY) ×1 IMPLANT
KIT TURNOVER KIT A (KITS) IMPLANT
NDL HYPO 22X1.5 SAFETY MO (MISCELLANEOUS) ×1 IMPLANT
NEEDLE HYPO 22X1.5 SAFETY MO (MISCELLANEOUS) ×1
PACK BASIC VI WITH GOWN DISP (CUSTOM PROCEDURE TRAY) ×1 IMPLANT
PENCIL SMOKE EVACUATOR (MISCELLANEOUS) ×1 IMPLANT
SHEARS HARMONIC 9CM CVD (BLADE) IMPLANT
SUT MNCRL AB 4-0 PS2 18 (SUTURE) ×1 IMPLANT
SUT VIC AB 3-0 SH 18 (SUTURE) ×1 IMPLANT
SYR BULB IRRIG 60ML STRL (SYRINGE) ×1 IMPLANT
SYR CONTROL 10ML LL (SYRINGE) ×1 IMPLANT
TOWEL OR 17X26 10 PK STRL BLUE (TOWEL DISPOSABLE) ×1 IMPLANT
TOWEL OR NON WOVEN STRL DISP B (DISPOSABLE) ×1 IMPLANT
TUBING CONNECTING 10 (TUBING) ×1 IMPLANT

## 2023-03-16 NOTE — Interval H&P Note (Signed)
History and Physical Interval Note:  03/16/2023 7:12 AM  Ernest Navarro.  has presented today for surgery, with the diagnosis of PRIMARY HYPERTHYROIDISM.  The various methods of treatment have been discussed with the patient and family. After consideration of risks, benefits and other options for treatment, the patient has consented to    Procedure(s): LEFT INFERIOR PARATHYROIDECTOMY (N/A) as a surgical intervention.    The patient's history has been reviewed, patient examined, no change in status, stable for surgery.  I have reviewed the patient's chart and labs.  Questions were answered to the patient's satisfaction.    Darnell Level, MD [facility] practice Office: [phone]   Darnell Level

## 2023-03-16 NOTE — Anesthesia Procedure Notes (Signed)
Procedure Name: Intubation Date/Time: 03/16/2023 7:36 AM  Performed by: Elisabeth Cara, CRNAPre-anesthesia Checklist: Patient identified, Emergency Drugs available, Suction available, Timeout performed and Patient being monitored Patient Re-evaluated:Patient Re-evaluated prior to induction Oxygen Delivery Method: Circle system utilized Preoxygenation: Pre-oxygenation with 100% oxygen Induction Type: Combination inhalational/ intravenous induction Ventilation: Mask ventilation without difficulty Laryngoscope Size: Mac and 4 Grade View: Grade II Tube type: Oral Tube size: 7.5 mm Number of attempts: 1 Airway Equipment and Method: Stylet Placement Confirmation: ETT inserted through vocal cords under direct vision, positive ETCO2 and breath sounds checked- equal and bilateral Secured at: 23 cm Tube secured with: Tape Dental Injury: Teeth and Oropharynx as per pre-operative assessment

## 2023-03-16 NOTE — Op Note (Signed)
OPERATIVE REPORT - PARATHYROIDECTOMY  Preoperative diagnosis: Primary hyperparathyroidism  Postop diagnosis: Same  Procedure: Left minimally invasive parathyroidectomy  Surgeon:  Darnell Level, MD  Anesthesia: General endotracheal  Estimated blood loss: Minimal  Preparation: ChloraPrep  Indications: Patient is referred by Dennie Maizes, NP, for surgical evaluation and management of newly diagnosed primary hyperparathyroidism. Patient was noted on routine laboratory studies to have an elevated calcium level of 11.8. Subsequent intact PTH level was found to be elevated at 143. Ionized calcium level is elevated at 6.0. Patient underwent a nuclear medicine parathyroid scan on November 21, 2022. This demonstrated a left inferior parathyroid adenoma.  Ultrasound of the neck confirmed a 1.6 cm nodule posterior to the left thyroid lobe consistent with adenoma.  Patient has noted fatigue. He has had bone and joint discomfort. He has undergone bilateral knee replacement. He notes urinary frequency. He has no history of nephrolithiasis. He has had no prior head or neck surgery. There is no family history of endocrine disease or endocrine neoplasms.  Patient now comes to surgery for minimally invasive parathyroidectomy.  Procedure: The patient was prepared in the pre-operative holding area. The patient was brought to the operating room and placed in a supine position on the operating room table. Following administration of general anesthesia, the patient was positioned and then prepped and draped in the usual strict aseptic fashion. After ascertaining that an adequate level of anesthesia been achieved, a neck incision was made with a #15 blade. Dissection was carried through subcutaneous tissues and platysma. Hemostasis was obtained with the electrocautery. Skin flaps were developed circumferentially and a Weitlander retractor was placed for exposure.  Strap muscles were incised in the midline. Strap muscles  were reflected laterally exposing the thyroid lobe. With gentle blunt dissection the thyroid lobe was mobilized.  Dissection was carried posteriorly and an enlarged parathyroid gland was identified. It was gently mobilized. Vascular structures were divided between small ligaclips. Care was taken to avoid the recurrent laryngeal nerve. The parathyroid gland was completely excised. It was submitted to pathology where frozen section confirmed hypercellular parathyroid tissue consistent with adenoma.  Neck was irrigated with warm saline and good hemostasis was noted. Fibrillar was placed in the operative field. Strap muscles were approximated in the midline with interrupted 3-0 Vicryl sutures. Platysma was closed with interrupted 3-0 Vicryl sutures. Marcaine was infiltrated circumferentially. Skin was closed with a running 4-0 Monocryl subcuticular suture. Wound was washed and dried and Dermabond was applied. Patient was awakened from anesthesia and brought to the recovery room. The patient tolerated the procedure well.   Darnell Level, MD [facility] Office: [phone]

## 2023-03-16 NOTE — TOC CM/SW Note (Signed)
Transition of Care [facility]) - Inpatient Brief Assessment   Patient Details  Name: Ernest Navarro. MRN: [id number] Date of Birth: 16-Oct-1960  Transition of Care [facility]) CM/SW Contact:    Darleene Cleaver, LCSW Phone Number: 03/16/2023, 5:26 PM   Clinical Narrative:  Patient has a PCP, insurance, and does not have any SDOH needs.  No anticipated TOC needs.   Transition of Care Asessment: Insurance and Status: Insurance coverage has been reviewed Patient has primary care physician: Yes Home environment has been reviewed: Yes Prior level of function:: Indep Prior/Current Home Services: No current home services Social Drivers of Health Review: SDOH reviewed no interventions necessary Readmission risk has been reviewed: Yes Transition of care needs: no transition of care needs at this time

## 2023-03-16 NOTE — Anesthesia Postprocedure Evaluation (Signed)
Anesthesia Post Note  Patient: Ernest Navarro.  Procedure(s) Performed: LEFT INFERIOR PARATHYROIDECTOMY     Patient location during evaluation: PACU Anesthesia Type: General Level of consciousness: awake and alert Pain management: pain level controlled Vital Signs Assessment: post-procedure vital signs reviewed and stable Respiratory status: spontaneous breathing, nonlabored ventilation, respiratory function stable and patient connected to nasal cannula oxygen Cardiovascular status: blood pressure returned to baseline and stable Postop Assessment: no apparent nausea or vomiting Anesthetic complications: no   No notable events documented.  Last Vitals:  Vitals:   03/16/23 0945 03/16/23 1000  BP: (!) 156/99 (!) 164/95  Pulse: 78 79  Resp: 12 14  Temp:    SpO2: 93% 94%    Last Pain:  Vitals:   03/16/23 1037  TempSrc:   PainSc: 3                  Olukemi Panchal P Kresta Templeman

## 2023-03-16 NOTE — Discharge Instructions (Signed)

## 2023-03-16 NOTE — Transfer of Care (Signed)
Immediate Anesthesia Transfer of Care Note  Patient: Ernest Navarro.  Procedure(s) Performed: LEFT INFERIOR PARATHYROIDECTOMY  Patient Location: PACU  Anesthesia Type:General  Level of Consciousness: awake, alert , oriented, and patient cooperative  Airway & Oxygen Therapy: Patient Spontanous Breathing and Patient connected to face mask oxygen  Post-op Assessment: Report given to RN, Post -op Vital signs reviewed and stable, and Patient moving all extremities  Post vital signs: Reviewed and stable  Last Vitals:  Vitals Value Taken Time  BP 165/99 03/16/23 0856  Temp    Pulse 93 03/16/23 0859  Resp 16 03/16/23 0859  SpO2 91 % 03/16/23 0859  Vitals shown include unfiled device data.  Last Pain:  Vitals:   03/16/23 0532  TempSrc: Oral         Complications: No notable events documented.

## 2023-03-17 ENCOUNTER — Encounter (HOSPITAL_COMMUNITY): Payer: Self-pay | Admitting: Surgery

## 2023-03-17 DIAGNOSIS — D351 Benign neoplasm of parathyroid gland: Secondary | ICD-10-CM | POA: Diagnosis not present

## 2023-03-17 LAB — CALCIUM: Calcium: 9.7 mg/dL (ref 8.9–10.3)

## 2023-03-17 LAB — GLUCOSE, CAPILLARY: Glucose-Capillary: 193 mg/dL — ABNORMAL HIGH (ref 70–99)

## 2023-03-17 NOTE — Progress Notes (Signed)
1 Day Post-Op   Subjective/Chief Complaint: No complaints.   Objective: Vital signs in last 24 hours: Temp:  [97.3 F (36.3 C)-98.3 F (36.8 C)] 98.3 F (36.8 C) (12/14 0950) Pulse Rate:  [88-108] 98 (12/14 0950) Resp:  [16-19] 16 (12/14 0950) BP: (135-180)/(80-115) 135/83 (12/14 0950) SpO2:  [91 %-97 %] 97 % (12/14 0950) Weight:  [146.7 kg] 146.7 kg (12/13 1051) Last BM Date : 03/15/23  Intake/Output from previous day: 12/13 0701 - 12/14 0700 In: 1850.9 [P.O.:1260; I.V.:490.9; IV Piggyback:100] Out: 415 [Urine:400; Blood:15] Intake/Output this shift: Total I/O In: 240 [P.O.:240] Out: -   General appearance: alert and cooperative Neck: Soft, incision looks good Resp: clear to auscultation bilaterally Cardio: regular rate and rhythm GI: soft, non-tender; bowel sounds normal; no masses,  no organomegaly  Lab Results:  No results for input(s): "WBC", "HGB", "HCT", "PLT" in the last 72 hours. BMET Recent Labs    03/17/23 0520  CALCIUM 9.7   PT/INR No results for input(s): "LABPROT", "INR" in the last 72 hours. ABG No results for input(s): "PHART", "HCO3" in the last 72 hours.  Invalid input(s): "PCO2", "PO2"  Studies/Results: No results found.  Anti-infectives: Anti-infectives (From admission, onward)    Start     Dose/Rate Route Frequency Ordered Stop   03/16/23 0600  ceFAZolin (ANCEF) IVPB 3g/100 mL premix        3 g 200 mL/hr over 30 Minutes Intravenous On call to O.R. 03/16/23 0526 03/16/23 0807       Assessment/Plan: s/p Procedure(s): LEFT INFERIOR PARATHYROIDECTOMY (N/A) Advance diet Discharge  LOS: 0 days    Chevis Pretty III 03/17/2023

## 2023-03-17 NOTE — Plan of Care (Signed)

## 2023-03-17 NOTE — Plan of Care (Signed)
  Problem: Education: Goal: Knowledge of General Education information will improve Description: Including pain rating scale, medication(s)/side effects and non-pharmacologic comfort measures Outcome: Adequate for Discharge   Problem: Health Behavior/Discharge Planning: Goal: Ability to manage health-related needs will improve Outcome: Adequate for Discharge   Problem: Clinical Measurements: Goal: Ability to maintain clinical measurements within normal limits will improve Outcome: Adequate for Discharge Goal: Will remain free from infection Outcome: Adequate for Discharge Goal: Diagnostic test results will improve Outcome: Adequate for Discharge Goal: Respiratory complications will improve Outcome: Adequate for Discharge Goal: Cardiovascular complication will be avoided Outcome: Adequate for Discharge   Problem: Clinical Measurements: Goal: Ability to maintain clinical measurements within normal limits will improve Outcome: Adequate for Discharge Goal: Will remain free from infection Outcome: Adequate for Discharge Goal: Diagnostic test results will improve Outcome: Adequate for Discharge Goal: Respiratory complications will improve Outcome: Adequate for Discharge Goal: Cardiovascular complication will be avoided Outcome: Adequate for Discharge   Problem: Education: Goal: Knowledge of General Education information will improve Description: Including pain rating scale, medication(s)/side effects and non-pharmacologic comfort measures Outcome: Adequate for Discharge

## 2023-03-19 ENCOUNTER — Ambulatory Visit: Payer: 59 | Admitting: Family Medicine

## 2023-03-19 ENCOUNTER — Encounter: Payer: Self-pay | Admitting: Family Medicine

## 2023-03-19 VITALS — BP 138/85 | HR 88 | Temp 98.1°F | Resp 18 | Ht 73.0 in | Wt 325.0 lb

## 2023-03-19 DIAGNOSIS — M545 Low back pain, unspecified: Secondary | ICD-10-CM

## 2023-03-19 DIAGNOSIS — Z9089 Acquired absence of other organs: Secondary | ICD-10-CM

## 2023-03-19 DIAGNOSIS — Z23 Encounter for immunization: Secondary | ICD-10-CM | POA: Diagnosis not present

## 2023-03-19 DIAGNOSIS — I10 Essential (primary) hypertension: Secondary | ICD-10-CM

## 2023-03-19 DIAGNOSIS — M5432 Sciatica, left side: Secondary | ICD-10-CM

## 2023-03-19 DIAGNOSIS — E119 Type 2 diabetes mellitus without complications: Secondary | ICD-10-CM | POA: Diagnosis not present

## 2023-03-19 DIAGNOSIS — Z9889 Other specified postprocedural states: Secondary | ICD-10-CM

## 2023-03-19 DIAGNOSIS — M79605 Pain in left leg: Secondary | ICD-10-CM

## 2023-03-19 LAB — SURGICAL PATHOLOGY

## 2023-03-19 MED ORDER — GABAPENTIN 400 MG PO CAPS: 400.0000 mg | ORAL_CAPSULE | Freq: Every day | ORAL | 1 refills | Status: DC

## 2023-03-19 MED ORDER — SEMAGLUTIDE (1 MG/DOSE) 4 MG/3ML ~~LOC~~ SOPN: 1.0000 mg | PEN_INJECTOR | SUBCUTANEOUS | 3 refills | Status: DC

## 2023-03-19 NOTE — Progress Notes (Signed)
Established Patient Office Visit  Subjective   Patient ID: Ernest Collingwood., male    DOB: 1961/03/08  Age: 62 y.o. MRN: [id number]  Chief Complaint  Patient presents with   Medical Management of Chronic Issues    Patient is here for a one month follow up for diabetes    HPI  Parathyroidectomy Pt reports he just had parathyroid surgery on Friday. He is wanting me to check the incision as it feels a little 'puffy'. He was noted to have persistent calcium on his labs and his PTH was checked, which was elevated. He repeated calcium levels on day of discharge which was normal range.   Hypertension Pt monitoring blood pressure at home. Most readings have been less than 140/90.   Diabetes Pt reports he ran out of the Ozempic a few weeks ago. He was last on 0.5 mg weekly. He needs this refilled. Pt also taking Glipizide 10mg  daily.  Back pain Pt was having back pain and left knee pain. He was referred to pain clinic for the back. He was also seeing his Orthopedic provider for the knee. He went through 6 weeks of PT which pt feels like this has helped the back pain tremendously. He followed back up with pain clinic and told them his improvement  Review of Systems  Constitutional:        Weight gain  All other systems reviewed and are negative.    Objective:     BP (!) 168/114   Pulse 88   Temp 98.1 F (36.7 C) (Oral)   Resp 18   Ht 6\' 1"  (1.854 m)   Wt (!) 325 lb (147.4 kg)   SpO2 97%   BMI 42.88 kg/m  BP Readings from Last 3 Encounters:  03/19/23 138/85  03/17/23 135/83  02/28/23 (!) 159/104      Physical Exam Vitals and nursing note reviewed.  Constitutional:      Appearance: Normal appearance. He is obese.  HENT:     Head: Normocephalic and atraumatic.     Right Ear: External ear normal.     Left Ear: External ear normal.     Nose: Nose normal.     Mouth/Throat:     Mouth: Mucous membranes are moist.     Pharynx: Oropharynx is clear.  Eyes:      Conjunctiva/sclera: Conjunctivae normal.     Pupils: Pupils are equal, round, and reactive to light.  Cardiovascular:     Rate and Rhythm: Normal rate and regular rhythm.     Pulses: Normal pulses.     Heart sounds: Normal heart sounds.  Pulmonary:     Effort: Pulmonary effort is normal.     Breath sounds: Normal breath sounds.  Skin:    General: Skin is warm.     Capillary Refill: Capillary refill takes less than 2 seconds.     Comments: Incision to anterior neck dry and intact  Neurological:     General: No focal deficit present.     Mental Status: He is alert and oriented to person, place, and time. Mental status is at baseline.  Psychiatric:        Mood and Affect: Mood normal.        Behavior: Behavior normal.        Thought Content: Thought content normal.        Judgment: Judgment normal.    No results found for any visits on 03/19/23.  Last hemoglobin A1c Lab Results  Component  Value Date   HGBA1C 7.6 (H) 02/28/2023      The ASCVD Risk score (Arnett DK, et al., 2019) failed to calculate for the following reasons:   Cannot find a previous HDL lab   Cannot find a previous total cholesterol lab    Assessment & Plan:   Problem List Items Addressed This Visit   None Type 2 diabetes mellitus without complication, without long-term current use of insulin (HCC) -     Semaglutide (1 MG/DOSE); Inject 1 mg into the skin once a week.  Dispense: 3 mL; Refill: 3  Lumbar pain with radiation down left leg -     Gabapentin; Take 1 capsule (400 mg total) by mouth daily.  Dispense: 90 capsule; Refill: 1  Sciatica of left side -     Gabapentin; Take 1 capsule (400 mg total) by mouth daily.  Dispense: 90 capsule; Refill: 1  Benign essential hypertension  Need for influenza vaccination -     Flu vaccine trivalent PF, 6mos and older(Flulaval,Afluria,Fluarix,Fluzone)   Refilled Semaglutide and increased from 0.5 mg weekly to 1 mg weekly.  Refilled Gabapentin 400mg  daily for  sciatic/back pain. Improving. To monitor blood pressure outside the office.  Flu vaccine today S/p parathyroidectomy. Continue to use ice.  No follow-ups on file.    Suzan Slick, MD

## 2023-04-10 DIAGNOSIS — Z9089 Acquired absence of other organs: Secondary | ICD-10-CM | POA: Insufficient documentation

## 2023-04-19 ENCOUNTER — Encounter: Payer: Self-pay | Admitting: Family Medicine

## 2023-04-22 ENCOUNTER — Encounter: Payer: Self-pay | Admitting: Orthopedic Surgery

## 2023-04-30 ENCOUNTER — Telehealth: Payer: Self-pay | Admitting: Orthopedic Surgery

## 2023-04-30 NOTE — Telephone Encounter (Signed)
Patient called asked if the Accommodation form was received and when will it be completed and put in mychart so that he can print the form off for his employer [employer].   Kae Heller is his Estate agent. Ph# for Toni Amend is [phone]  Fax# is [phone]  The number to contact patient is [phone]

## 2023-05-01 NOTE — Telephone Encounter (Signed)
He sent it in a mychart message on 1/19. Do you want me to print out for you?

## 2023-05-04 ENCOUNTER — Telehealth: Payer: Self-pay | Admitting: Orthopedic Surgery

## 2023-05-04 NOTE — Telephone Encounter (Signed)
Received call from [place] with [employer] needing to know when the accommodation form will be faxed back to her? The number to contact Toni Amend is [phone]    Please see previous notes.

## 2023-05-06 NOTE — Telephone Encounter (Signed)
Hi Lauren can you print this thing out so I can fill it out.  Thanks

## 2023-05-08 ENCOUNTER — Other Ambulatory Visit: Payer: Self-pay | Admitting: Family Medicine

## 2023-05-08 DIAGNOSIS — I10 Essential (primary) hypertension: Secondary | ICD-10-CM

## 2023-05-08 NOTE — Telephone Encounter (Signed)
Printed and placed on Lauren's desk for Dr. August Saucer to complete.

## 2023-05-09 NOTE — Telephone Encounter (Signed)
 We discussed and he needs to get a job description of his job at the place versus job description at his home thanks

## 2023-05-11 ENCOUNTER — Telehealth: Payer: Self-pay

## 2023-05-11 NOTE — Telephone Encounter (Signed)
 Courtney from [employer] called, talked with her at length about the accommodation form.  She could not provide any other information so she will have consulting group investigate and reach out to try to provide Dr Rozelle Corning with requested information.

## 2023-05-13 ENCOUNTER — Other Ambulatory Visit: Payer: Self-pay | Admitting: Family Medicine

## 2023-05-13 DIAGNOSIS — E119 Type 2 diabetes mellitus without complications: Secondary | ICD-10-CM

## 2023-05-17 NOTE — Telephone Encounter (Signed)
Ok for this for 4 months then rov to reassess thx

## 2023-05-23 LAB — HM DIABETES EYE EXAM

## 2023-05-24 ENCOUNTER — Encounter: Payer: Self-pay | Admitting: Family Medicine

## 2023-05-31 ENCOUNTER — Other Ambulatory Visit: Payer: Self-pay | Admitting: Family Medicine

## 2023-05-31 DIAGNOSIS — I10 Essential (primary) hypertension: Secondary | ICD-10-CM

## 2023-06-04 ENCOUNTER — Ambulatory Visit (INDEPENDENT_AMBULATORY_CARE_PROVIDER_SITE_OTHER): Payer: 59 | Admitting: Family Medicine

## 2023-06-04 ENCOUNTER — Encounter: Payer: Self-pay | Admitting: Family Medicine

## 2023-06-04 VITALS — BP 143/81 | HR 101 | Temp 98.9°F | Resp 18 | Ht 73.0 in | Wt 327.4 lb

## 2023-06-04 DIAGNOSIS — E119 Type 2 diabetes mellitus without complications: Secondary | ICD-10-CM

## 2023-06-04 DIAGNOSIS — Z136 Encounter for screening for cardiovascular disorders: Secondary | ICD-10-CM

## 2023-06-04 DIAGNOSIS — Z125 Encounter for screening for malignant neoplasm of prostate: Secondary | ICD-10-CM

## 2023-06-04 DIAGNOSIS — Z0000 Encounter for general adult medical examination without abnormal findings: Secondary | ICD-10-CM | POA: Diagnosis not present

## 2023-06-04 DIAGNOSIS — M545 Low back pain, unspecified: Secondary | ICD-10-CM

## 2023-06-04 DIAGNOSIS — M79605 Pain in left leg: Secondary | ICD-10-CM | POA: Diagnosis not present

## 2023-06-04 DIAGNOSIS — Z7985 Long-term (current) use of injectable non-insulin antidiabetic drugs: Secondary | ICD-10-CM

## 2023-06-04 DIAGNOSIS — Z13220 Encounter for screening for lipoid disorders: Secondary | ICD-10-CM

## 2023-06-04 MED ORDER — METHYLPREDNISOLONE ACETATE 80 MG/ML IJ SUSP
80.0000 mg | Freq: Once | INTRAMUSCULAR | Status: AC
  Administered 2023-06-04: 80 mg via INTRAMUSCULAR

## 2023-06-04 MED ORDER — GABAPENTIN 400 MG PO CAPS: 400.0000 mg | ORAL_CAPSULE | Freq: Two times a day (BID) | ORAL | Status: DC

## 2023-06-04 NOTE — Progress Notes (Signed)
 Established Patient Office Visit  Subjective   Patient ID: Ernest Moss., male    DOB: [birth year]  Age: 63 y.o. MRN: [id number]  Chief Complaint        Back Pain    Left leg pain    Back Pain  Back pain Pt with chronic left lower back pain and left leg pain. Went walking this morning and now has some left leg pain. Starts at his left lower back and goes down to his left knee. He reports numbness sensation as well as pain. He has difficulty walking as a result of this.  He has been referred to PMR last year for this pain. He was given Gabapentin 400mg  bid to use but he only takes it once a day. He also has knee issues and went through PT. The pain improved with PT so he hasn't been back to pain management for his back.    Review of Systems  Musculoskeletal:  Positive for back pain and joint pain.  All other systems reviewed and are negative.    Objective:     BP (!) 143/81   Pulse (!) 101   Temp 98.9 F (37.2 C) (Oral)   Resp 18   Ht 6\' 1"  (1.854 m)   Wt (!) 327 lb 6.4 oz (148.5 kg)   SpO2 96%   BMI 43.20 kg/m  BP Readings from Last 3 Encounters:  06/04/23 (!) 143/81  03/19/23 138/85  03/17/23 135/83      Physical Exam Vitals and nursing note reviewed.  Constitutional:      Appearance: Normal appearance. He is obese.  HENT:     Head: Normocephalic and atraumatic.     Right Ear: External ear normal.     Left Ear: External ear normal.     Nose: Nose normal.     Mouth/Throat:     Mouth: Mucous membranes are moist.     Pharynx: Oropharynx is clear.  Eyes:     Conjunctiva/sclera: Conjunctivae normal.     Pupils: Pupils are equal, round, and reactive to light.  Cardiovascular:     Rate and Rhythm: Normal rate.  Pulmonary:     Effort: Pulmonary effort is normal.  Skin:    General: Skin is warm.     Capillary Refill: Capillary refill takes less than 2 seconds.  Neurological:     General: No focal deficit present.     Mental Status: He is alert and  oriented to person, place, and time. Mental status is at baseline.     Gait: Gait abnormal.  Psychiatric:        Mood and Affect: Mood normal.        Behavior: Behavior normal.        Thought Content: Thought content normal.        Judgment: Judgment normal.    No results found for any visits on 06/04/23.     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 Given Depo 80mg  IM today and advised to go up  on his Gabapentin from 400mg  daily to BID. He also should follow back up with PMR for pain management.   Return in about 3 months (around 09/04/2023) for Diabetes.    Suzan Slick, MD

## 2023-06-04 NOTE — Progress Notes (Signed)
 Complete physical exam  Patient: Ernest Navarro.   DOB: [birth year]   63 y.o. Male  MRN: [id number]  Subjective:    Chief Complaint  Patient presents with   Annual Exam   Back Pain    Left leg pain    Ernest Navarro. is a 63 y.o. male who presents today for a complete physical exam. He reports consuming a  low carbohydrate and low fat/fried foods  diet.  Walking daily  He generally feels well. He reports sleeping fairly well. He does have additional problems to discuss today. See separate encounter.   Most recent fall risk assessment:    11/09/2022   11:43 AM  Fall Risk   Falls in the past year? 0  Number falls in past yr: 0  Injury with Fall? 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   S/P total knee arthroplasty, right 10/28/2020   Type 2 diabetes mellitus without complication, without long-term current use of insulin (HCC) 05/15/2019   Hypotestosteronism 05/13/2019   Class 3 severe obesity due to excess calories with serious comorbidity and body mass index (BMI) of 40.0 to 44.9 in adult [facility]) 11/13/2018   Other male erectile dysfunction 05/25/2017   Lichen planus 05/27/2015   Obstructive sleep apnea syndrome 05/06/2015   Gout 06/19/2012   Benign essential hypertension 06/19/2012   Past Medical History:  Diagnosis Date   Arthritis    bilateral knees   Class 3 severe obesity due to excess calories with serious comorbidity and body mass index (BMI) of 40.0 to 44.9 in adult (HCC) 11/13/2018   Essential hypertension, benign 06/19/2012   Obstructive sleep apnea syndrome 05/06/2015   Formatting of this note might be  different from the original. Overview: High probability diagnosis based on previous reported sleep study and weight gain with witnessed apneas and loud snoring. We discussed the basic physiology and medical concerns. Plan-schedule sleep study, emphasize driving responsibility and advise weight loss Last Assessment & Plan: Formatting of this note might be different    Other male erectile dysfunction 05/25/2017   Pneumonia    Sleep apnea    Type 2 diabetes mellitus without complication, without long-term current use of insulin (HCC) 05/15/2019   Past Surgical History:  Procedure Laterality Date   KNEE SURGERY Right    x3 arthroscopies   PARATHYROIDECTOMY N/A 03/16/2023   Procedure: LEFT INFERIOR PARATHYROIDECTOMY;  Surgeon: Darnell Level, MD;  Location: [facility];  Service: General;  Laterality: N/A;   TOTAL KNEE ARTHROPLASTY Right 10/28/2020   Procedure: RIGHT TOTAL KNEE ARTHROPLASTY;  Surgeon: Cammy Copa, MD;  Location: [facility] OR;  Service: Orthopedics;  Laterality: Right;   TOTAL KNEE ARTHROPLASTY Left 09/05/2021   Procedure: LEFT TOTAL KNEE ARTHROPLASTY;  Surgeon: Cammy Copa, MD;  [facility]: [facility];  Service: Orthopedics;  Laterality: Left;   Social History   Socioeconomic History   Marital status: Single    Spouse name: Not on file   Number of children: Not on file   Years of education: Not on file  Highest education level: Not on file  Occupational History   Not on file  Tobacco Use   Smoking status: Never   Smokeless tobacco: Never  Vaping Use   Vaping status: Never Used  Substance and Sexual Activity   Alcohol use: No    Alcohol/week: 0.0 standard drinks of alcohol   Drug use: No   Sexual activity: Not on file  Other Topics Concern   Not on file  Social History Narrative   Not on file   Social Drivers of Health   Financial Resource Strain: Not on file  Food Insecurity: Patient Declined (03/16/2023)   Hunger Vital Sign    Worried About Running Out of Food  in the Last Year: Patient declined    Ran Out of Food in the Last Year: Patient declined  Transportation Needs: Patient Declined (03/16/2023)   PRAPARE - Administrator, Civil Service (Medical): Patient declined    Lack of Transportation (Non-Medical): Patient declined  Physical Activity: Not on file  Stress: Not on file  Social Connections: Unknown (08/13/2021)   Received from [facility]   Social Network    Social Network: Not on file  Intimate Partner Violence: Patient Declined (03/16/2023)   Humiliation, Afraid, Rape, and Kick questionnaire    Fear of Current or Ex-Partner: Patient declined    Emotionally Abused: Patient declined    Physically Abused: Patient declined    Sexually Abused: Patient declined   Family History  Problem Relation Age of Onset   Lymphoma Brother    Diabetes Maternal Grandmother    Colon cancer Neg Hx    Pancreatic cancer Neg Hx    Stomach cancer Neg Hx    Esophageal cancer Neg Hx    Liver disease Neg Hx    Allergies  Allergen Reactions   Lisinopril     Headache   Metformin And Related Nausea Only   Oxycodone Nausea Only    Makes him "delirious"    Shellfish Allergy     Flares gout       Patient Care Team: Suzan Slick, MD as PCP - General (Family Medicine)   Outpatient Medications Prior to Visit  Medication Sig   acetaminophen (TYLENOL) 500 MG tablet Take 1,000 mg by mouth every 6 (six) hours as needed for moderate pain.   amLODipine (NORVASC) 10 MG tablet TAKE 1 TABLET BY MOUTH EVERY DAY   aspirin EC 81 MG tablet Take 1 tablet (81 mg total) by mouth daily. Swallow whole.   celecoxib (CELEBREX) 100 MG capsule Take 100 mg by mouth daily.   glipiZIDE (GLUCOTROL) 10 MG tablet TAKE 1 TABLET BY MOUTH EVERY DAY BEFORE BREAKFAST   losartan (COZAAR) 25 MG tablet TAKE 1 TABLET (25 MG TOTAL) BY MOUTH DAILY.   Semaglutide, 1 MG/DOSE, (OZEMPIC, 1 MG/DOSE,) 4 MG/3ML SOPN Inject 1 mg into the skin once a week.    [DISCONTINUED] gabapentin (NEURONTIN) 400 MG capsule Take 1 capsule (400 mg total) by mouth daily.   No facility-administered medications prior to visit.    Review of Systems  All other systems reviewed and are negative.         Objective:     BP (!) 143/81   Pulse (!) 101   Temp 98.9 F (37.2 C) (Oral)   Resp 18   Ht 6\' 1"  (1.854 m)   Wt (!) 327 lb 6.4 oz (148.5 kg)   SpO2 96%   BMI 43.20 kg/m  BP Readings from Last  3 Encounters:  06/04/23 (!) 143/81  03/19/23 138/85  03/17/23 135/83      Physical Exam Vitals and nursing note reviewed.  Constitutional:      Appearance: Normal appearance. He is obese.  HENT:     Head: Normocephalic and atraumatic.     Right Ear: Tympanic membrane, ear canal and external ear normal.     Left Ear: Tympanic membrane, ear canal and external ear normal.     Nose: Nose normal.     Mouth/Throat:     Mouth: Mucous membranes are moist.     Pharynx: Oropharynx is clear.  Eyes:     Conjunctiva/sclera: Conjunctivae normal.     Pupils: Pupils are equal, round, and reactive to light.  Cardiovascular:     Rate and Rhythm: Normal rate and regular rhythm.     Pulses: Normal pulses.     Heart sounds: Normal heart sounds.  Pulmonary:     Effort: Pulmonary effort is normal.     Breath sounds: Normal breath sounds.  Abdominal:     General: Abdomen is flat. Bowel sounds are normal.  Skin:    General: Skin is warm.     Capillary Refill: Capillary refill takes less than 2 seconds.  Neurological:     General: No focal deficit present.     Mental Status: He is alert and oriented to person, place, and time. Mental status is at baseline.  Psychiatric:        Mood and Affect: Mood normal.        Behavior: Behavior normal.        Thought Content: Thought content normal.        Judgment: Judgment normal.      No results found for any visits on 06/04/23. Last CBC Lab Results  Component Value Date   WBC 10.9 (H) 02/28/2023   HGB 15.1  02/28/2023   HCT 50.9 02/28/2023   MCV 84.7 02/28/2023   MCH 25.1 (L) 02/28/2023   RDW 15.0 02/28/2023   PLT 228 02/28/2023   Last metabolic panel Lab Results  Component Value Date   GLUCOSE 124 (H) 02/28/2023   NA 137 02/28/2023   K 3.8 02/28/2023   CL 104 02/28/2023   CO2 25 02/28/2023   BUN 8 02/28/2023   CREATININE 0.90 02/28/2023   GFRNONAA >60 02/28/2023   CALCIUM 9.7 03/17/2023   PROT 6.8 04/05/2022   ALBUMIN 4.5 04/05/2022   LABGLOB 2.3 04/05/2022   AGRATIO 2.0 04/05/2022   BILITOT 0.3 04/05/2022   ALKPHOS 127 (H) 04/05/2022   AST 16 04/05/2022   ALT 29 04/05/2022   ANIONGAP 8 02/28/2023   Last lipids Lab Results  Component Value Date   LDLDIRECT 132 (H) 06/15/2012   Last hemoglobin A1c Lab Results  Component Value Date   HGBA1C 7.6 (H) 02/28/2023        Assessment & Plan:    Routine Health Maintenance and Physical Exam  Immunization History  Administered Date(s) Administered   Influenza Split 02/17/2015   Influenza, Quadrivalent, Recombinant, Inj, Pf 01/28/2020   Influenza, Seasonal, Injecte, Preservative Fre 02/23/2013, 03/19/2023   Influenza,inj,Quad PF,6+ Mos 02/09/2017, 02/09/2017, 11/24/2017, 02/01/2022   Moderna Sars-Covid-2 Vaccination 07/01/2019, 07/29/2019    Health Maintenance  Topic Date Due   Pneumococcal Vaccine 72-56 Years old (1 of 2 - PCV) Never done   HIV Screening  Never done   Hepatitis C Screening  Never done   Zoster Vaccines- Shingrix (1 of 2) Never done   Diabetic  kidney evaluation - Urine ACR  04/06/2023   FOOT EXAM  04/06/2023   HEMOGLOBIN A1C  08/28/2023   Diabetic kidney evaluation - eGFR measurement  02/28/2024   OPHTHALMOLOGY EXAM  05/22/2024   Colonoscopy  07/25/2025   INFLUENZA VACCINE  Completed   HPV VACCINES  Aged Out   DTaP/Tdap/Td  Discontinued   COVID-19 Vaccine  Discontinued    Discussed health benefits of physical activity, and encouraged him to engage in regular exercise appropriate for his age  and condition.  Problem List Items Addressed This Visit       Endocrine   Type 2 diabetes mellitus without complication, without long-term current use of insulin (HCC)   Relevant Orders   CBC with Differential/Platelet   Comprehensive metabolic panel   Hemoglobin A1c   Microalbumin / creatinine urine ratio   Other Visit Diagnoses       Annual physical exam    -  Primary     Encounter for lipid screening for cardiovascular disease       Relevant Orders   Lipid panel     Screening PSA (prostate specific antigen)       Relevant Orders   PSA    Return in about 3 months (around 09/04/2023) for Diabetes. Annual physical exam  Encounter for lipid screening for cardiovascular disease -     Lipid panel  Type 2 diabetes mellitus without complication, without long-term current use of insulin (HCC) -     CBC with Differential/Platelet -     Comprehensive metabolic panel -     Hemoglobin A1c -     Microalbumin / creatinine urine ratio  Screening PSA (prostate specific antigen) -     PSA  Screening CPE labs today        Suzan Slick, MD

## 2023-06-05 ENCOUNTER — Ambulatory Visit: Payer: Self-pay | Admitting: Family Medicine

## 2023-06-05 DIAGNOSIS — M545 Low back pain, unspecified: Secondary | ICD-10-CM

## 2023-06-05 DIAGNOSIS — M62838 Other muscle spasm: Secondary | ICD-10-CM

## 2023-06-05 LAB — CBC WITH DIFFERENTIAL/PLATELET
Basophils Absolute: 0 10*3/uL (ref 0.0–0.2)
Basos: 1 %
EOS (ABSOLUTE): 0.3 10*3/uL (ref 0.0–0.4)
Eos: 3 %
Hematocrit: 48.1 % (ref 37.5–51.0)
Hemoglobin: 14.9 g/dL (ref 13.0–17.7)
Immature Grans (Abs): 0.1 10*3/uL (ref 0.0–0.1)
Immature Granulocytes: 1 %
Lymphocytes Absolute: 1.6 10*3/uL (ref 0.7–3.1)
Lymphs: 19 %
MCH: 25.2 pg — ABNORMAL LOW (ref 26.6–33.0)
MCHC: 31 g/dL — ABNORMAL LOW (ref 31.5–35.7)
MCV: 81 fL (ref 79–97)
Monocytes Absolute: 0.8 10*3/uL (ref 0.1–0.9)
Monocytes: 10 %
Neutrophils Absolute: 5.7 10*3/uL (ref 1.4–7.0)
Neutrophils: 66 %
Platelets: 281 10*3/uL (ref 150–450)
RBC: 5.92 x10E6/uL — ABNORMAL HIGH (ref 4.14–5.80)
RDW: 14.1 % (ref 11.6–15.4)
WBC: 8.6 10*3/uL (ref 3.4–10.8)

## 2023-06-05 LAB — LIPID PANEL
Chol/HDL Ratio: 3.6 ratio (ref 0.0–5.0)
Cholesterol, Total: 180 mg/dL (ref 100–199)
HDL: 50 mg/dL (ref >39)
LDL Chol Calc (NIH): 92 mg/dL (ref 0–99)
Triglycerides: 225 mg/dL — ABNORMAL HIGH (ref 0–149)
VLDL Cholesterol Cal: 38 mg/dL (ref 5–40)

## 2023-06-05 LAB — COMPREHENSIVE METABOLIC PANEL
ALT: 26 IU/L (ref 0–44)
AST: 21 IU/L (ref 0–40)
Albumin: 4.1 g/dL (ref 3.9–4.9)
Alkaline Phosphatase: 86 IU/L (ref 44–121)
BUN/Creatinine Ratio: 10 (ref 10–24)
BUN: 9 mg/dL (ref 8–27)
Bilirubin Total: 0.3 mg/dL (ref 0.0–1.2)
CO2: 23 mmol/L (ref 20–29)
Calcium: 9.5 mg/dL (ref 8.6–10.2)
Chloride: 104 mmol/L (ref 96–106)
Creatinine, Ser: 0.86 mg/dL (ref 0.76–1.27)
Globulin, Total: 2 g/dL (ref 1.5–4.5)
Glucose: 113 mg/dL — ABNORMAL HIGH (ref 70–99)
Potassium: 4.1 mmol/L (ref 3.5–5.2)
Sodium: 142 mmol/L (ref 134–144)
Total Protein: 6.1 g/dL (ref 6.0–8.5)
eGFR: 97 mL/min/{1.73_m2} (ref >59)

## 2023-06-05 LAB — MICROALBUMIN / CREATININE URINE RATIO
Creatinine, Urine: 257.1 mg/dL
Microalb/Creat Ratio: 7 mg/g{creat} (ref 0–29)
Microalbumin, Urine: 18.8 ug/mL

## 2023-06-05 LAB — HEMOGLOBIN A1C
Est. average glucose Bld gHb Est-mCnc: 160 mg/dL
Hgb A1c MFr Bld: 7.2 % — ABNORMAL HIGH (ref 4.8–5.6)

## 2023-06-05 LAB — PSA: Prostate Specific Ag, Serum: 1.2 ng/mL (ref 0.0–4.0)

## 2023-06-05 NOTE — Telephone Encounter (Signed)
 2nd attempt-"call cannot be completed as dialed." Will place back in callback folder

## 2023-06-05 NOTE — Telephone Encounter (Signed)
 Spoke with Ernest Navarro and he states that the injection worked great, but he is now having issues in his left leg with muscle spasms and would like to know if he got some muscle relaxer's sent to his pharmacy.  Advised patient that PCP was out of the office but would send the message for her to respond upon her return.   Please advise

## 2023-06-05 NOTE — Telephone Encounter (Signed)
  3rd attempt, when dialing patient's phone # "call cannot be completed as dialed" error message. Routing CRM to clinic.   Copied from CRM [id number]. Topic: Clinical - Medical Advice >> Jun 05, 2023 11:43 AM Tiffany B wrote: Reason for CRM: Patient was seen by PCP and she received a shot for pain, patient states the shot is working great. Caller states he is now experiencing left leg muscle spasm and would like a medication for muscle spasms.   Reason for Disposition . Third attempt to contact caller AND no contact made. Phone number verified.  Protocols used: No Contact or Duplicate Contact Call-A-AH

## 2023-06-05 NOTE — Telephone Encounter (Signed)
  1st attempt, "call not be completed as dialed".  Copied from CRM [id number]. Topic: Clinical - Medical Advice >> Jun 05, 2023 11:43 AM Tiffany B wrote: Reason for CRM: Patient was seen by PCP and she received a shot for pain, patient states the shot is working great. Caller states he is now experiencing left leg muscle spasm and would like a medication for muscle spasms.

## 2023-06-06 ENCOUNTER — Encounter: Payer: Self-pay | Admitting: Family Medicine

## 2023-06-06 MED ORDER — BACLOFEN 10 MG PO TABS: 10.0000 mg | ORAL_TABLET | Freq: Three times a day (TID) | ORAL | 1 refills | Status: AC | PRN

## 2023-06-06 NOTE — Telephone Encounter (Signed)
 Muscle relaxer sent to his pharmacy.

## 2023-06-06 NOTE — Addendum Note (Signed)
 Addended by: Suzan Slick on: 06/06/2023 07:50 AM   Modules accepted: Orders

## 2023-06-21 ENCOUNTER — Encounter: Attending: Physical Medicine & Rehabilitation | Admitting: Physical Medicine & Rehabilitation

## 2023-06-21 ENCOUNTER — Encounter: Payer: Self-pay | Admitting: Physical Medicine & Rehabilitation

## 2023-06-21 VITALS — BP 150/94 | HR 83 | Ht 73.0 in | Wt 327.0 lb

## 2023-06-21 DIAGNOSIS — M545 Low back pain, unspecified: Secondary | ICD-10-CM | POA: Insufficient documentation

## 2023-06-21 DIAGNOSIS — M79605 Pain in left leg: Secondary | ICD-10-CM | POA: Diagnosis present

## 2023-06-21 MED ORDER — CELECOXIB 100 MG PO CAPS: 100.0000 mg | ORAL_CAPSULE | Freq: Two times a day (BID) | ORAL | 1 refills | Status: DC

## 2023-06-21 MED ORDER — KETOROLAC TROMETHAMINE 60 MG/2ML IM SOLN
60.0000 mg | Freq: Once | INTRAMUSCULAR | Status: AC
Start: 2023-06-21 — End: 2023-06-21
  Administered 2023-06-21: 60 mg via INTRAMUSCULAR

## 2023-06-21 MED ORDER — GABAPENTIN 600 MG PO TABS: 600.0000 mg | ORAL_TABLET | Freq: Two times a day (BID) | ORAL | 1 refills | Status: DC

## 2023-06-21 NOTE — Progress Notes (Signed)
 Subjective:    Patient ID: Ernest Navarro., male    DOB: [birth year], 63 y.o.   MRN: [id number]  HPI 63 year old male with history of chronic low back and left lower extremity pain.  He was last seen in August 2025.  Physical therapy was prescribed.  The patient went through several sessions of therapy at [facility] office in November as well as October 2024.  Following that his back and lower extremity pain were doing much better.  He has been doing exercises on his own including the home exercise program plus increasing his walking distance.  Fortunately over the last month or so the patient's symptoms have recurred.  He was seen by his primary care physician for a yearly physical and was advised to follow-up in this office.  He received Depo-Medrol 80 mg IM at that visit for his symptoms but only had 1 day of relief. Patient has also complained of increased weight gain over the last several months. He has had no falls or trauma.  He has had no new injuries of his lower extremity.  He does have some numbness in the left lower extremity but does not note any significant weakness. Left low back hip and knee/leg pain  Tylenol was helping initially but now does not seem to be helping much. He is taking Celebrex 100 mg/day He is taking gabapentin 400 mg twice daily   LUMBAR SPINE - COMPLETE 4+ VIEW   COMPARISON:  None Available.   FINDINGS: Segmentation: 5 lumbar-type vertebral bodies based on the available coverage. No evidence of acute fracture or traumatic listhesis. Mild disc space height loss at L4-L5 and advanced disc space height loss at L5-S1. Moderate to advanced lower lumbar facet arthropathy.   IMPRESSION: Advanced disc space height loss at L5-S1.     Electronically Signed   By: Minerva Fester M.D.   On: 09/24/2022 22:18 Pain Inventory Average Pain 8 Pain Right Now 8 My pain is sharp, burning, dull, stabbing, tingling, and aching  In the last 24 hours,  has pain interfered with the following? General activity 10 Relation with others 8 Enjoyment of life 10 What TIME of day is your pain at its worst? morning  and varies Sleep (in general) Poor  Pain is worse with: walking, sitting, standing, and some activites Pain improves with: heat/ice and laying down Relief from Meds:  .  Family History  Problem Relation Age of Onset   Lymphoma Brother    Diabetes Maternal Grandmother    Colon cancer Neg Hx    Pancreatic cancer Neg Hx    Stomach cancer Neg Hx    Esophageal cancer Neg Hx    Liver disease Neg Hx    Social History   Socioeconomic History   Marital status: Single    Spouse name: Not on file   Number of children: Not on file   Years of education: Not on file   Highest education level: Not on file  Occupational History   Not on file  Tobacco Use   Smoking status: Never   Smokeless tobacco: Never  Vaping Use   Vaping status: Never Used  Substance and Sexual Activity   Alcohol use: No    Alcohol/week: 0.0 standard drinks of alcohol   Drug use: No   Sexual activity: Not on file  Other Topics Concern   Not on file  Social History Narrative   Not on file   Social Drivers of Corporate investment banker  Strain: Not on file  Food Insecurity: Patient Declined (03/16/2023)   Hunger Vital Sign    Worried About Running Out of Food in the Last Year: Patient declined    Ran Out of Food in the Last Year: Patient declined  Transportation Needs: Patient Declined (03/16/2023)   PRAPARE - Administrator, Civil Service (Medical): Patient declined    Lack of Transportation (Non-Medical): Patient declined  Physical Activity: Not on file  Stress: Not on file  Social Connections: Unknown (08/13/2021)   Received from [facility]   Social Network    Social Network: Not on file   Past Surgical History:  Procedure Laterality Date   KNEE SURGERY Right    x3 arthroscopies   PARATHYROIDECTOMY N/A  03/16/2023   Procedure: LEFT INFERIOR PARATHYROIDECTOMY;  Surgeon: Darnell Level, MD;  Location: [facility] ORS;  Service: General;  Laterality: N/A;   TOTAL KNEE ARTHROPLASTY Right 10/28/2020   Procedure: RIGHT TOTAL KNEE ARTHROPLASTY;  Surgeon: Cammy Copa, MD;  Location: [facility] OR;  Service: Orthopedics;  Laterality: Right;   TOTAL KNEE ARTHROPLASTY Left 09/05/2021   Procedure: LEFT TOTAL KNEE ARTHROPLASTY;  Surgeon: Cammy Copa, MD;  Location: [facility] OR;  Service: Orthopedics;  Laterality: Left;   Past Surgical History:  Procedure Laterality Date   KNEE SURGERY Right    x3 arthroscopies   PARATHYROIDECTOMY N/A 03/16/2023   Procedure: LEFT INFERIOR PARATHYROIDECTOMY;  Surgeon: Darnell Level, MD;  Location: [facility]S;  Service: General;  Laterality: N/A;   TOTAL KNEE ARTHROPLASTY Right 10/28/2020   Procedure: RIGHT TOTAL KNEE ARTHROPLASTY;  Surgeon: Cammy Copa, MD;  Location: [facility] OR;  Service: Orthopedics;  Laterality: Right;   TOTAL KNEE ARTHROPLASTY Left 09/05/2021   Procedure: LEFT TOTAL KNEE ARTHROPLASTY;  Surgeon: Cammy Copa, MD;  Location: [facility] OR;  Service: Orthopedics;  Laterality: Left;   Past Medical History:  Diagnosis Date   Arthritis    bilateral knees   Class 3 severe obesity due to excess calories with serious comorbidity and body mass index (BMI) of 40.0 to 44.9 in adult [facility]) 11/13/2018   Essential hypertension, benign 06/19/2012   Obstructive sleep apnea syndrome 05/06/2015   Formatting of this note might be different from the original. Overview: High probability diagnosis based on previous reported sleep study and weight gain with witnessed apneas and loud snoring. We discussed the basic physiology and medical concerns. Plan-schedule sleep study, emphasize driving responsibility and advise weight loss Last Assessment & Plan: Formatting of this note might be different    Other male erectile dysfunction 05/25/2017   Pneumonia    Sleep apnea    Type 2 diabetes  mellitus without complication, without long-term current use of insulin (HCC) 05/15/2019   BP (!) 150/94   Pulse 83   Ht 6\' 1"  (1.854 m)   Wt (!) 327 lb (148.3 kg)   SpO2 94%   BMI 43.14 kg/m   Opioid Risk Score:   Fall Risk Score:  `1  Depression screen [facility] 2/9     11/09/2022   11:49 AM 04/05/2022    9:38 AM  Depression screen PHQ 2/9  Decreased Interest 0 0  Down, Depressed, Hopeless 0 0  PHQ - 2 Score 0 0  Altered sleeping 2 1  Tired, decreased energy 1 1  Change in appetite 1 1  Feeling bad or failure about yourself  0 0  Trouble concentrating 0 0  Moving slowly or fidgety/restless 0   Suicidal thoughts 0 0  PHQ-9 Score 4 3  Difficult doing work/chores  Not difficult at all      Review of Systems  Musculoskeletal:  Positive for back pain.       Right leg pain  Neurological:  Positive for numbness.  All other systems reviewed and are negative.      Objective:   Physical Exam  General No acute distress Mood and affect appropriate Extremities without edema Right thigh circumference just superior to  patella is 53 cm Left thigh circumference just superior to patella is 53 cm MSK full range of motion at the knees Lumbar range of motion normal with forward flexion but pain with extension of the lumbar spine in the lower lumbar region left greater than right Negative straight leg raising bilaterally Sensation intact to pinprick light touch bilateral L2-3-4 distribution        Assessment & Plan:   1.  Lumbar spondylosis he does have facet degenerative changes L4-5 L5-S1 Which are likely symptomatic he has pain with extension greater than with flexion.  He does have L5-S1 disc degeneration as well. Will give Toradol 60 mg IM today Increase Celebrex to 100 mg twice daily 2.  Left lower extremity pain likely radicular although no clear-cut neurologic signs.  Suspect L3 or L4 level.  He has not had MRI of the lumbar spine but if he does not improve in 1 month  would recommend Increase gabapentin to 600 mg twice daily

## 2023-06-21 NOTE — Patient Instructions (Signed)
 Try stationary bike rather than walking for exercise

## 2023-07-18 NOTE — Progress Notes (Unsigned)
 Subjective:    Patient ID: Ernest Crocker., male    DOB: 1960-12-30, 63 y.o.   MRN: [id number]  HPI CC: Left leg and thigh  63 year old male with history of lumbar spondylosis affecting lower lumbar area.  He also has L5-S1 disc space narrowing.  He has had no red flags such as progressive motor strength loss or loss of bowel and bladder function.  No recent infections or fevers Has numbness and tingling , sometimes buttocks/ post hip  Celebrex 100mg  BID- BMET normal last month   Gabapentin helps some  Seen by Dr August Saucer- work note  Would like to discuss water therapy  Tried biking instead of walking for exercise 3 d per week   Oxycodone intolerance- delirium Pain Inventory Average Pain 4 Pain Right Now 8 My pain is sharp, burning, dull, stabbing, tingling, and aching  In the last 24 hours, has pain interfered with the following? General activity 5 Relation with others 5 Enjoyment of life 5 What TIME of day is your pain at its worst? morning , daytime, evening, and night Sleep (in general) Poor  Pain is worse with: walking, bending, sitting, and standing Pain improves with: rest, heat/ice, medication, and injections Relief from Meds: 6  Family History  Problem Relation Age of Onset   Lymphoma Brother    Diabetes Maternal Grandmother    Colon cancer Neg Hx    Pancreatic cancer Neg Hx    Stomach cancer Neg Hx    Esophageal cancer Neg Hx    Liver disease Neg Hx    Social History   Socioeconomic History   Marital status: Single    Spouse name: Not on file   Number of children: Not on file   Years of education: Not on file   Highest education level: Not on file  Occupational History   Not on file  Tobacco Use   Smoking status: Never   Smokeless tobacco: Never  Vaping Use   Vaping status: Never Used  Substance and Sexual Activity   Alcohol use: No    Alcohol/week: 0.0 standard drinks of alcohol   Drug use: No   Sexual activity: Not on file  Other Topics  Concern   Not on file  Social History Narrative   Not on file   Social Drivers of Health   Financial Resource Strain: Not on file  Food Insecurity: Patient Declined (03/16/2023)   Hunger Vital Sign    Worried About Running Out of Food in the Last Year: Patient declined    Ran Out of Food in the Last Year: Patient declined  Transportation Needs: Patient Declined (03/16/2023)   PRAPARE - Administrator, Civil Service (Medical): Patient declined    Lack of Transportation (Non-Medical): Patient declined  Physical Activity: Not on file  Stress: Not on file  Social Connections: Unknown (08/13/2021)   Received from [facility]   Social Network    Social Network: Not on file   Past Surgical History:  Procedure Laterality Date   KNEE SURGERY Right    x3 arthroscopies   PARATHYROIDECTOMY N/A 03/16/2023   Procedure: LEFT INFERIOR PARATHYROIDECTOMY;  Surgeon: Darnell Level, MD;  Location: [facility];  Service: General;  Laterality: N/A;   TOTAL KNEE ARTHROPLASTY Right 10/28/2020   Procedure: RIGHT TOTAL KNEE ARTHROPLASTY;  Surgeon: Cammy Copa, MD;  Location: [facility] OR;  Service: Orthopedics;  Laterality: Right;   TOTAL KNEE ARTHROPLASTY Left 09/05/2021   Procedure: LEFT TOTAL KNEE ARTHROPLASTY;  Surgeon: Cammy Copa, MD;  Location: [facility] OR;  Service: Orthopedics;  Laterality: Left;   Past Surgical History:  Procedure Laterality Date   KNEE SURGERY Right    x3 arthroscopies   PARATHYROIDECTOMY N/A 03/16/2023   Procedure: LEFT INFERIOR PARATHYROIDECTOMY;  Surgeon: Darnell Level, MD;  Location: [facility];  Service: General;  Laterality: N/A;   TOTAL KNEE ARTHROPLASTY Right 10/28/2020   Procedure: RIGHT TOTAL KNEE ARTHROPLASTY;  Surgeon: Cammy Copa, MD;  Location: [facility] OR;  Service: Orthopedics;  Laterality: Right;   TOTAL KNEE ARTHROPLASTY Left 09/05/2021   Procedure: LEFT TOTAL KNEE ARTHROPLASTY;  Surgeon: Cammy Copa, MD;  Location: [facility] OR;   Service: Orthopedics;  Laterality: Left;   Past Medical History:  Diagnosis Date   Arthritis    bilateral knees   Class 3 severe obesity due to excess calories with serious comorbidity and body mass index (BMI) of 40.0 to 44.9 in adult [facility]) 11/13/2018   Essential hypertension, benign 06/19/2012   Obstructive sleep apnea syndrome 05/06/2015   Formatting of this note might be different from the original. Overview: High probability diagnosis based on previous reported sleep study and weight gain with witnessed apneas and loud snoring. We discussed the basic physiology and medical concerns. Plan-schedule sleep study, emphasize driving responsibility and advise weight loss Last Assessment & Plan: Formatting of this note might be different    Other male erectile dysfunction 05/25/2017   Pneumonia    Sleep apnea    Type 2 diabetes mellitus without complication, without long-term current use of insulin (HCC) 05/15/2019   BP (!) 150/90   Pulse 85   Ht 6\' 1"  (1.854 m)   SpO2 93%   BMI 43.14 kg/m   Opioid Risk Score:   Fall Risk Score:  `1  Depression screen [facility] 2/9     07/19/2023    9:42 AM 11/09/2022   11:49 AM 04/05/2022    9:38 AM  Depression screen PHQ 2/9  Decreased Interest 0 0 0  Down, Depressed, Hopeless 0 0 0  PHQ - 2 Score 0 0 0  Altered sleeping  2 1  Tired, decreased energy  1 1  Change in appetite  1 1  Feeling bad or failure about yourself   0 0  Trouble concentrating  0 0  Moving slowly or fidgety/restless  0   Suicidal thoughts  0 0  PHQ-9 Score  4 3  Difficult doing work/chores   Not difficult at all     Review of Systems  Musculoskeletal:  Positive for back pain and gait problem.       Left leg pain  All other systems reviewed and are negative.      Objective:   Physical Exam   General no acute distress Mood and affect appropriate Negative straight leg raising bilaterally Knees have no pain with range of motion or palpation.  Left knee is slightly  swollen compared to the right knee. There is reduced sensation to pinprick over the left lateral thigh compared to the right side. Motor strength is 5/5 bilateral hip flexor knee extensor ankle dorsiflexor Patient ambulates without assistive device no evidence of toe drag or knee instability Lumbar spine has no tenderness over the lower lumbar area There is pain with lumbar flexion and extension and has approximately 50% normal range.     Assessment & Plan:   1.  Lumbar spondylosis he does have facet degenerative changes L4-5 L5-S1 Which are likely symptomatic he has pain with extension  greater than with flexion.  He does have L5-S1 disc degeneration as well. Will give Toradol 60 mg IM today Will discontinue Celebrex due to GI upset started on tramadol 50 mg twice daily hope that this is not a long-term medicine we will do for 1 month and then reevaluate 2.  Left lower extremity pain likely radicular, he now has sensory loss to pinprick left L3 distribution   He has not had MRI of the lumbar spine will order since he has had no improvement over the 6 weeks continue gabapentin to 600 mg twice daily

## 2023-07-19 ENCOUNTER — Telehealth: Payer: Self-pay | Admitting: Physical Medicine & Rehabilitation

## 2023-07-19 ENCOUNTER — Other Ambulatory Visit: Payer: Self-pay | Admitting: Physical Medicine & Rehabilitation

## 2023-07-19 ENCOUNTER — Encounter: Attending: Physical Medicine & Rehabilitation | Admitting: Physical Medicine & Rehabilitation

## 2023-07-19 ENCOUNTER — Encounter: Payer: Self-pay | Admitting: Physical Medicine & Rehabilitation

## 2023-07-19 VITALS — BP 157/99 | HR 85 | Ht 73.0 in

## 2023-07-19 DIAGNOSIS — M79605 Pain in left leg: Secondary | ICD-10-CM | POA: Diagnosis present

## 2023-07-19 DIAGNOSIS — M545 Low back pain, unspecified: Secondary | ICD-10-CM | POA: Insufficient documentation

## 2023-07-19 MED ORDER — KETOROLAC TROMETHAMINE 60 MG/2ML IM SOLN
60.0000 mg | Freq: Once | INTRAMUSCULAR | Status: AC
  Administered 2023-07-19: 60 mg via INTRAMUSCULAR

## 2023-07-19 MED ORDER — TRAMADOL HCL 50 MG PO TABS: 50.0000 mg | ORAL_TABLET | Freq: Two times a day (BID) | ORAL | 0 refills | Status: DC

## 2023-07-19 NOTE — Telephone Encounter (Signed)
 Patient called in he forgot to obtain the handicap placard that was mentioned during the visit, pt asked if it could possibly be mailed to him since its hard for him to come back in once completed.

## 2023-07-23 ENCOUNTER — Telehealth: Payer: Self-pay | Admitting: Physical Medicine & Rehabilitation

## 2023-07-23 ENCOUNTER — Ambulatory Visit
Admission: RE | Admit: 2023-07-23 | Discharge: 2023-07-23 | Disposition: A | Discharge status: Home / Self Care | Source: Ambulatory Visit | Attending: Physical Medicine & Rehabilitation | Admitting: Physical Medicine & Rehabilitation

## 2023-07-23 DIAGNOSIS — M79605 Pain in left leg: Secondary | ICD-10-CM

## 2023-07-23 NOTE — Telephone Encounter (Signed)
 Patient called in requesting letter that was provided to him during his visit 4/17 for his employer to include more detail . Requesting limitation for being unable to go in office , treatment plan and if possible diagnosis and what was prescribed . Since patient is on Tramadol  he is not able to drive and would need this letter as soon as possible

## 2023-07-23 NOTE — Telephone Encounter (Signed)
 Patient called LVM 4/18 @2 :13pm - states Tramadol  needs PA

## 2023-07-24 ENCOUNTER — Encounter: Payer: Self-pay | Admitting: Physical Medicine & Rehabilitation

## 2023-07-28 ENCOUNTER — Other Ambulatory Visit: Payer: Self-pay | Admitting: Family Medicine

## 2023-07-28 DIAGNOSIS — E119 Type 2 diabetes mellitus without complications: Secondary | ICD-10-CM

## 2023-07-30 ENCOUNTER — Telehealth: Payer: Self-pay | Admitting: *Deleted

## 2023-07-30 NOTE — Telephone Encounter (Signed)
 Prior auth for Tramadol  50 mg #60 submitted to [facility] via CoverMyMeds  Ernest Navarro (Key: [id number]) Your demographic data has been sent to [facility]

## 2023-07-30 NOTE — Telephone Encounter (Signed)
 Requesting rx rf of Ozempic  1mg  Last written 05/14/2023 Last OV 06/04/2023 Upcoming appt =09/03/2023

## 2023-07-31 ENCOUNTER — Other Ambulatory Visit: Payer: Self-pay | Admitting: Family Medicine

## 2023-07-31 ENCOUNTER — Other Ambulatory Visit: Payer: Self-pay | Admitting: Surgical

## 2023-07-31 DIAGNOSIS — E119 Type 2 diabetes mellitus without complications: Secondary | ICD-10-CM

## 2023-08-07 NOTE — Telephone Encounter (Signed)
 submitted

## 2023-08-08 ENCOUNTER — Telehealth: Payer: Self-pay | Admitting: Physical Medicine & Rehabilitation

## 2023-08-08 NOTE — Telephone Encounter (Signed)
 Patient has a follow up appt with you on 5/15, but he thought you were doing a back injection for him.  When he checked out at last visit your note said follow up.  Can you clarify?  Thank you.

## 2023-08-16 ENCOUNTER — Encounter: Attending: Physical Medicine & Rehabilitation | Admitting: Physical Medicine & Rehabilitation

## 2023-08-16 ENCOUNTER — Encounter: Payer: Self-pay | Admitting: Physical Medicine & Rehabilitation

## 2023-08-16 VITALS — BP 136/90 | HR 101 | Ht 73.0 in | Wt 332.0 lb

## 2023-08-16 DIAGNOSIS — G950 Syringomyelia and syringobulbia: Secondary | ICD-10-CM

## 2023-08-16 DIAGNOSIS — Z79899 Other long term (current) drug therapy: Secondary | ICD-10-CM | POA: Diagnosis not present

## 2023-08-16 DIAGNOSIS — G8929 Other chronic pain: Secondary | ICD-10-CM

## 2023-08-16 DIAGNOSIS — Z5181 Encounter for therapeutic drug level monitoring: Secondary | ICD-10-CM | POA: Diagnosis not present

## 2023-08-16 DIAGNOSIS — G894 Chronic pain syndrome: Secondary | ICD-10-CM | POA: Diagnosis not present

## 2023-08-16 MED ORDER — TRAMADOL HCL 50 MG PO TABS: 50.0000 mg | ORAL_TABLET | Freq: Four times a day (QID) | ORAL | 2 refills | Status: AC | PRN

## 2023-08-16 NOTE — Progress Notes (Signed)
 Subjective:    Patient ID: Ernest Alosa., male    DOB: 04/21/1960, 63 y.o.   MRN: [id number]  HPI 63 year old male with chronic low back pain who has been followed in this office since 11/09/2022.  Lumbar x-rays in 2024 demonstrated significant L5-S1 disc space loss with lower lumbar facet arthropathy.  He went through some outpatient therapy last fall and really was doing quite well with his pain for several months afterwards.  He was doing exercise as well as a walking program but around February 2025 his pain worsened once again.  He was on Celebrex  100 mg twice a day as well as gabapentin  600 mg twice a day but this did not help.  He continues to work in a sedentary job. MRI LUMBAR SPINE WITHOUT CONTRAST   TECHNIQUE: Multiplanar, multisequence MR imaging of the lumbar spine was performed. No intravenous contrast was administered.   COMPARISON:  Lumbar radiographs 09/20/2022   FINDINGS: Segmentation: There are five lumbar type vertebral bodies. The last full intervertebral disc space is labeled L5-S1. This correlates with the lumbar radiographs.   Alignment:  Normal   Vertebrae:  No fracture, evidence of discitis, or bone lesion.   Conus medullaris and cauda equina: Conus extends to the T12-L1 level. Possible cord thinning in the lower thoracic area at the T10-11 level. There is also a elongated area abnormal T2 and STIR signal intensity in the lower cord just above the conus which appears to be focal syrinx. There may be slight clumping of the nerve roots just below the conus. I would recommend a follow-up MRI of the thoracolumbar spine with contrast for further evaluation of these findings.   Paraspinal and other soft tissues: No significant paraspinal or retroperitoneal findings.   Disc levels:   T11-12: No significant findings. The syrinx starts just at the T11-12 disc space.   T12-L1: No disc protrusions, spinal or foraminal stenosis. Mild facet disease.    L1-2: No disc protrusions, spinal or foraminal stenosis. There is a small left paraspinal lipoma which appears to involve the left neural foramen and has slight mass effect on the left side of the thecal sac. Slight displacement of the left L1 nerve root.   L2-3: Mild degenerative disc disease with a slight bulging annulus. There is also moderate facet disease and ligamentum flavum thickening contributing to moderate spinal and bilateral lateral recess stenosis. No foraminal stenosis. There is also a tiny synovial cyst on the right but no mass effect.   L3-4: Moderate facet disease and ligamentum flavum thickening in combination with a mild bulging annulus contributing to early spinal stenosis and mild bilateral lateral recess stenosis. No significant foraminal stenosis. Mild foraminal encroachment on the right due to a shallow disc osteophyte complex.   L4-5: Advanced facet disease, ligamentum flavum thickening and mild bulging annulus contributing to mild spinal stenosis and moderate bilateral lateral recess stenosis. Mild foraminal stenosis bilaterally.   L5-S1: Advanced degenerative disc disease with disc space narrowing, osteophytic ridging and a bulging degenerated annulus. The spinal canal is generous in is no significant spinal stenosis. Broad-based right-sided disc osteophyte complex with mild encroachment on the right S1 nerve root. The exiting L5 nerve roots appear normal.   IMPRESSION: 1. Possible cord thinning in the lower thoracic area at the T10-11 level. There is also a elongated area of abnormal T2 and STIR signal intensity in the lower cord just above the conus which appears to be focal syrinx. I would recommend a follow-up MRI of  the thoracic spine without and with contrast for further evaluation of these findings. 2. Moderate multifactorial spinal and bilateral lateral recess stenosis at L2-3. 3. Early spinal stenosis and mild bilateral lateral recess  stenosis at L3-4. Mild foraminal encroachment on the right due to a shallow disc osteophyte complex. 4. Mild spinal stenosis and moderate bilateral lateral recess stenosis at L4-5. Mild foraminal stenosis bilaterally. 5. Advanced degenerative disc disease at L5-S1 with a broad-based right-sided disc osteophyte complex with mild encroachment on the right S1 nerve root.     Electronically Signed   By: Marrian Siva M.D.   On: 08/08/2023 11:56 Pain Inventory Average Pain 7 Pain Right Now 5 My pain is constant, sharp, burning, dull, stabbing, tingling, and aching  In the last 24 hours, has pain interfered with the following? General activity 5 Relation with others 10 Enjoyment of life 10 What TIME of day is your pain at its worst? morning , daytime, evening, and night Sleep (in general) Poor  Pain is worse with: walking, bending, sitting, standing, unsure, and some activites Pain improves with: rest and injections Relief from Meds: 8  Family History  Problem Relation Age of Onset   Lymphoma Brother    Diabetes Maternal Grandmother    Colon cancer Neg Hx    Pancreatic cancer Neg Hx    Stomach cancer Neg Hx    Esophageal cancer Neg Hx    Liver disease Neg Hx    Social History   Socioeconomic History   Marital status: Single    Spouse name: Not on file   Number of children: Not on file   Years of education: Not on file   Highest education level: Not on file  Occupational History   Not on file  Tobacco Use   Smoking status: Never   Smokeless tobacco: Never  Vaping Use   Vaping status: Never Used  Substance and Sexual Activity   Alcohol use: No    Alcohol/week: 0.0 standard drinks of alcohol   Drug use: No   Sexual activity: Not on file  Other Topics Concern   Not on file  Social History Narrative   Not on file   Social Drivers of Health   Financial Resource Strain: Not on file  Food Insecurity: Patient Declined (03/16/2023)   Hunger Vital Sign    Worried  About Running Out of Food in the Last Year: Patient declined    Ran Out of Food in the Last Year: Patient declined  Transportation Needs: Patient Declined (03/16/2023)   PRAPARE - Administrator, Civil Service (Medical): Patient declined    Lack of Transportation (Non-Medical): Patient declined  Physical Activity: Not on file  Stress: Not on file  Social Connections: Unknown (08/13/2021)   Received from [facility]   Social Network    Social Network: Not on file   Past Surgical History:  Procedure Laterality Date   KNEE SURGERY Right    x3 arthroscopies   PARATHYROIDECTOMY N/A 03/16/2023   Procedure: LEFT INFERIOR PARATHYROIDECTOMY;  Surgeon: Oralee Billow, MD;  Location: [facility];  Service: General;  Laterality: N/A;   TOTAL KNEE ARTHROPLASTY Right 10/28/2020   Procedure: RIGHT TOTAL KNEE ARTHROPLASTY;  Surgeon: Jasmine Mesi, MD;  Location: [facility] OR;  Service: Orthopedics;  Laterality: Right;   TOTAL KNEE ARTHROPLASTY Left 09/05/2021   Procedure: LEFT TOTAL KNEE ARTHROPLASTY;  Surgeon: Jasmine Mesi, MD;  Location: [facility] OR;  Service: Orthopedics;  Laterality: Left;   Past Surgical  History:  Procedure Laterality Date   KNEE SURGERY Right    x3 arthroscopies   PARATHYROIDECTOMY N/A 03/16/2023   Procedure: LEFT INFERIOR PARATHYROIDECTOMY;  Surgeon: Oralee Billow, MD;  Location: [facility];  Service: General;  Laterality: N/A;   TOTAL KNEE ARTHROPLASTY Right 10/28/2020   Procedure: RIGHT TOTAL KNEE ARTHROPLASTY;  Surgeon: Jasmine Mesi, MD;  Location: [facility] OR;  Service: Orthopedics;  Laterality: Right;   TOTAL KNEE ARTHROPLASTY Left 09/05/2021   Procedure: LEFT TOTAL KNEE ARTHROPLASTY;  Surgeon: Jasmine Mesi, MD;  Location[facility] OR;  Service: Orthopedics;  Laterality: Left;   Past Medical History:  Diagnosis Date   Arthritis    bilateral knees   Class 3 severe obesity due to excess calories with serious comorbidity and body mass index (BMI) of  40.0 to 44.9 in adult 11/13/2018   Essential hypertension, benign 06/19/2012   Obstructive sleep apnea syndrome 05/06/2015   Formatting of this note might be different from the original. Overview: High probability diagnosis based on previous reported sleep study and weight gain with witnessed apneas and loud snoring. We discussed the basic physiology and medical concerns. Plan-schedule sleep study, emphasize driving responsibility and advise weight loss Last Assessment & Plan: Formatting of this note might be different    Other male erectile dysfunction 05/25/2017   Pneumonia    Sleep apnea    Type 2 diabetes mellitus without complication, without long-term current use of insulin  (HCC) 05/15/2019   Ht 6\' 1"  (1.854 m)   Wt (!) 332 lb (150.6 kg)   BMI 43.80 kg/m   Opioid Risk Score:   Fall Risk Score:  `1  Depression screen [facility] 2/9     07/19/2023    9:42 AM 11/09/2022   11:49 AM 04/05/2022    9:38 AM  Depression screen PHQ 2/9  Decreased Interest 0 0 0  Down, Depressed, Hopeless 0 0 0  PHQ - 2 Score 0 0 0  Altered sleeping  2 1  Tired, decreased energy  1 1  Change in appetite  1 1  Feeling bad or failure about yourself   0 0  Trouble concentrating  0 0  Moving slowly or fidgety/restless  0   Suicidal thoughts  0 0  PHQ-9 Score  4 3  Difficult doing work/chores   Not difficult at all    Review of Systems  Musculoskeletal:  Positive for back pain.       Pain in left hip, left leg  All other systems reviewed and are negative.      Objective:   Physical Exam  General no acute distress Mood affect appropriate Extremity without edema Negative straight leg raising bilaterally Lower extremity sensation is reduced left elbow III nerve root distribution     Assessment & Plan:   1.  Lumbar spondylosis he does have facet degenerative changes L4-5 L5-S1 Which are likely symptomatic he has pain with extension greater than with flexion.  He does have L5-S1 disc degeneration as  well. Back pain is likely due to this.  He has tried physical therapy without much improvement.  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  Also recent recurrence  2.  Left lower extremity pain likely radicular, he now has sensory loss to pinprick left L3 distribution   MRI of the lumbar spine does demonstrate L2-L3 spinal stenosis which is likely causing this.  Will continue  gabapentin  600 mg twice daily he may be a candidate for epidural injection at L2-3 once we have worked up the syringomyelia.  #3.  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

## 2023-08-24 LAB — TOXASSURE SELECT,+ANTIDEPR,UR

## 2023-08-24 NOTE — Telephone Encounter (Signed)
 Cannot find matching patient with Name and Date of Birth provided

## 2023-08-29 ENCOUNTER — Ambulatory Visit (HOSPITAL_BASED_OUTPATIENT_CLINIC_OR_DEPARTMENT_OTHER)
Admission: RE | Admit: 2023-08-29 | Discharge: 2023-08-29 | Disposition: A | Discharge status: Home / Self Care | Source: Ambulatory Visit | Attending: Physical Medicine & Rehabilitation | Admitting: Physical Medicine & Rehabilitation

## 2023-08-29 ENCOUNTER — Other Ambulatory Visit: Payer: Self-pay | Admitting: Physical Medicine & Rehabilitation

## 2023-08-29 DIAGNOSIS — G950 Syringomyelia and syringobulbia: Secondary | ICD-10-CM | POA: Diagnosis present

## 2023-09-03 ENCOUNTER — Ambulatory Visit: Admitting: Family Medicine

## 2023-09-17 ENCOUNTER — Ambulatory Visit: Admitting: Family Medicine

## 2023-09-21 ENCOUNTER — Other Ambulatory Visit: Payer: Self-pay | Admitting: Family Medicine

## 2023-09-21 DIAGNOSIS — E119 Type 2 diabetes mellitus without complications: Secondary | ICD-10-CM

## 2023-09-27 ENCOUNTER — Encounter: Payer: Self-pay | Admitting: Physical Medicine & Rehabilitation

## 2023-09-27 ENCOUNTER — Encounter: Attending: Physical Medicine & Rehabilitation | Admitting: Physical Medicine & Rehabilitation

## 2023-09-27 VITALS — BP 159/94 | HR 85 | Ht 73.0 in | Wt 330.0 lb

## 2023-09-27 DIAGNOSIS — M545 Low back pain, unspecified: Secondary | ICD-10-CM | POA: Insufficient documentation

## 2023-09-27 DIAGNOSIS — M79605 Pain in left leg: Secondary | ICD-10-CM | POA: Diagnosis not present

## 2023-09-27 MED ORDER — ALPRAZOLAM 1 MG PO TABS: 1.0000 mg | ORAL_TABLET | Freq: Once | ORAL | 0 refills | Status: AC

## 2023-09-27 NOTE — Progress Notes (Signed)
 Subjective:    Patient ID: Ernest Navarro., male    DOB: [birth year], 63 y.o.   MRN: [id number]  HPI 63 year old male with chronic low back pain radiating to the left lower extremity his main complaint is left lateral thigh pain.  Reviewed his lumbar MRI with him he has multilevel lumbar stenosis from L1-L5.  Looking specifically at his symptoms in conjunction with his MRI L2-3 and L3-4 levels are likely to be the most symptomatic, however he does have potential left L1 nerve root involvement and even further down at L4-5 and L5-S1.  The patient has occasional pain down into his left foot but this is not a consistent problem We discussed the syringomyelia at T12.  He had been seen by neurosurgery who felt that this was just an incidental finding of no clinical importance, no further workup was recommended Pain Inventory Average Pain 3 Pain Right Now 6 My pain is sharp, stabbing, tingling, and aching  In the last 24 hours, has pain interfered with the following? General activity 7 Relation with others 10 Enjoyment of life 5 What TIME of day is your pain at its worst? morning , daytime, evening, and night Sleep (in general) Poor  Pain is worse with: walking, bending, sitting, inactivity, and standing Pain improves with: rest, therapy/exercise, and pacing activities Relief from Meds: 8  Family History  Problem Relation Age of Onset   Lymphoma Brother    Diabetes Maternal Grandmother    Colon cancer Neg Hx    Pancreatic cancer Neg Hx    Stomach cancer Neg Hx    Esophageal cancer Neg Hx    Liver disease Neg Hx    Social History   Socioeconomic History   Marital status: Single    Spouse name: Not on file   Number of children: Not on file   Years of education: Not on file   Highest education level: Not on file  Occupational History   Not on file  Tobacco Use   Smoking status: Never   Smokeless tobacco: Never  Vaping Use   Vaping status: Never Used  Substance and Sexual  Activity   Alcohol use: No    Alcohol/week: 0.0 standard drinks of alcohol   Drug use: No   Sexual activity: Not on file  Other Topics Concern   Not on file  Social History Narrative   Not on file   Social Drivers of Health   Financial Resource Strain: Not on file  Food Insecurity: Patient Declined (03/16/2023)   Hunger Vital Sign    Worried About Running Out of Food in the Last Year: Patient declined    Ran Out of Food in the Last Year: Patient declined  Transportation Needs: Patient Declined (03/16/2023)   PRAPARE - Administrator, Civil Service (Medical): Patient declined    Lack of Transportation (Non-Medical): Patient declined  Physical Activity: Not on file  Stress: Not on file  Social Connections: Unknown (08/13/2021)   [facility]   Social Network    Social Network: Not on file   Past Surgical History:  Procedure Laterality Date   KNEE SURGERY Right    x3 arthroscopies   PARATHYROIDECTOMY N/A 03/16/2023   Procedure: LEFT INFERIOR PARATHYROIDECTOMY;  Surgeon: Eletha Boas, MD;  Location: [facility];  Service: General;  Laterality: N/A;   TOTAL KNEE ARTHROPLASTY Right 10/28/2020   Procedure: RIGHT TOTAL KNEE ARTHROPLASTY;  Surgeon: Addie Cordella Hamilton, MD;  Location: [facility] OR;  Service: Orthopedics;  Laterality: Right;   TOTAL KNEE ARTHROPLASTY Left 09/05/2021   Procedure: LEFT TOTAL KNEE ARTHROPLASTY;  Surgeon: Addie Cordella Hamilton, MD;  Location[facility] OR;  Service: Orthopedics;  Laterality: Left;   Past Surgical History:  Procedure Laterality Date   KNEE SURGERY Right    x3 arthroscopies   PARATHYROIDECTOMY N/A 03/16/2023   Procedure: LEFT INFERIOR PARATHYROIDECTOMY;  Surgeon: Eletha Boas, MD;  Location: [facility];  Service: General;  Laterality: N/A;   TOTAL KNEE ARTHROPLASTY Right 10/28/2020   Procedure: RIGHT TOTAL KNEE ARTHROPLASTY;  Surgeon: Addie Cordella Hamilton, MD;  Location: [facility] OR;  Service: Orthopedics;  Laterality: Right;   TOTAL KNEE  ARTHROPLASTY Left 09/05/2021   Procedure: LEFT TOTAL KNEE ARTHROPLASTY;  Surgeon: Addie Cordella Hamilton, MD;  Location: [facility] OR;  Service: Orthopedics;  Laterality: Left;   Past Medical History:  Diagnosis Date   Arthritis    bilateral knees   Class 3 severe obesity due to excess calories with serious comorbidity and body mass index (BMI) of 40.0 to 44.9 in adult 11/13/2018   Essential hypertension, benign 06/19/2012   Obstructive sleep apnea syndrome 05/06/2015   Formatting of this note might be different from the original. Overview: High probability diagnosis based on previous reported sleep study and weight gain with witnessed apneas and loud snoring. We discussed the basic physiology and medical concerns. Plan-schedule sleep study, emphasize driving responsibility and advise weight loss Last Assessment & Plan: Formatting of this note might be different    Other male erectile dysfunction 05/25/2017   Pneumonia    Sleep apnea    Type 2 diabetes mellitus without complication, without long-term current use of insulin  (HCC) 05/15/2019   BP (!) 159/94 (BP Location: Left Arm, Patient Position: Sitting, Cuff Size: Large) Comment: Second BP reading  Pulse 85   Ht 6' 1 (1.854 m)   Wt (!) 330 lb (149.7 kg)   SpO2 94%   BMI 43.54 kg/m   Opioid Risk Score:   Fall Risk Score:  `1  Depression screen [facility] 2/9     09/27/2023    9:50 AM 08/16/2023   10:01 AM 07/19/2023    9:42 AM 11/09/2022   11:49 AM 04/05/2022    9:38 AM  Depression screen PHQ 2/9  Decreased Interest 0 0 0 0 0  Down, Depressed, Hopeless 0 0 0 0 0  PHQ - 2 Score 0 0 0 0 0  Altered sleeping    2 1  Tired, decreased energy    1 1  Change in appetite    1 1  Feeling bad or failure about yourself     0 0  Trouble concentrating    0 0  Moving slowly or fidgety/restless    0   Suicidal thoughts    0 0  PHQ-9 Score    4 3  Difficult doing work/chores     Not difficult at all       Review of Systems  Cardiovascular:         Hypertension  Musculoskeletal:  Positive for back pain.       Lower back pain, left hip pain radiating down to foot  All other systems reviewed and are negative.      Objective:   Physical Exam General No acute distress Mood and affect appropriate Negative straight leg raise bilaterally Motor strength is 5/5 bilateral hip flexors knee extensor ankle dorsiflexor Sensation reduced left lateral thigh Lumbar spine has 50% range flexion extension lateral bending and rotation.  He  does have some pain with left lateral bending. Ambulates without assistive device no evidence of toe drag or instability Lumbar spine has mild tenderness palpation bilaterally in the lower lumbar regions.      Assessment & Plan:   1.  Lumbar spinal stenosis multilevel in the lumbar spine symptomatically has mainly left lateral thigh pain and numbness which correlates best with L2-3 L3-4 levels.  Will set him up for lumbar epidurals at those levels 64483 and 35515.  We discussed that he will need a driver.  He is not on anticoagulation.  He does not have a contrast allergy.  He is a diabetic and we discussed that his blood sugars may be elevated for 2 to 3 days after injection.  He is nervous about the injection have written a prescription for Xanax 1 mg to be taken as soon as he gets to the office prior to the injection

## 2023-10-01 ENCOUNTER — Ambulatory Visit (INDEPENDENT_AMBULATORY_CARE_PROVIDER_SITE_OTHER): Admitting: Family Medicine

## 2023-10-01 ENCOUNTER — Encounter: Payer: Self-pay | Admitting: Family Medicine

## 2023-10-01 VITALS — BP 155/98 | HR 90 | Temp 97.8°F | Resp 18 | Ht 73.0 in | Wt 329.2 lb

## 2023-10-01 DIAGNOSIS — Z7985 Long-term (current) use of injectable non-insulin antidiabetic drugs: Secondary | ICD-10-CM

## 2023-10-01 DIAGNOSIS — I10 Essential (primary) hypertension: Secondary | ICD-10-CM

## 2023-10-01 DIAGNOSIS — E119 Type 2 diabetes mellitus without complications: Secondary | ICD-10-CM | POA: Diagnosis not present

## 2023-10-01 DIAGNOSIS — Z23 Encounter for immunization: Secondary | ICD-10-CM | POA: Diagnosis not present

## 2023-10-01 LAB — POCT GLYCOSYLATED HEMOGLOBIN (HGB A1C): Hemoglobin A1C: 7.9 % — AB (ref 4.0–5.6)

## 2023-10-01 MED ORDER — LOSARTAN POTASSIUM 100 MG PO TABS: 100.0000 mg | ORAL_TABLET | Freq: Every day | ORAL | 1 refills | Status: DC

## 2023-10-01 NOTE — Progress Notes (Signed)
 Established Patient Office Visit  Subjective   Patient ID: Ernest Pons., male    DOB: 1960-08-28  Age: 63 y.o. MRN: [id number]  Chief Complaint  Patient presents with   Medical Management of Chronic Issues    3 Month follow up for DM and HGA1C    HPI  Diabetes Pt not checking sugars; last time he's checked over a month. He also reports forgetting his injections. He feels like he's not doing well on his control. He is up walking and increase in activity. He takes Glipizide  10 mg daily. He would like PCV-20 today.  Hypertension Pt taking Amlodipine  10mg  and Loartan 50+25mg  daily. His bp is above goal. Home checks are running 138 sbp per pt. Review of previous BP checks in other doctor's offices have been running what it is today sbp 150s.   Review of Systems  All other systems reviewed and are negative.    Objective:     BP (!) 155/98   Pulse 90   Temp 97.8 F (36.6 C) (Oral)   Resp 18   Ht 6' 1 (1.854 m)   Wt (!) 329 lb 3.2 oz (149.3 kg)   SpO2 95%   BMI 43.43 kg/m  BP Readings from Last 3 Encounters:  10/01/23 (!) 155/98  09/27/23 (!) 159/94  08/16/23 (!) 136/90      Physical Exam Vitals and nursing note reviewed.  Constitutional:      Appearance: Normal appearance. He is obese.  HENT:     Head: Normocephalic and atraumatic.     Right Ear: External ear normal.     Left Ear: External ear normal.     Nose: Nose normal.     Mouth/Throat:     Mouth: Mucous membranes are moist.     Pharynx: Oropharynx is clear.   Eyes:     Conjunctiva/sclera: Conjunctivae normal.     Pupils: Pupils are equal, round, and reactive to light.    Cardiovascular:     Rate and Rhythm: Normal rate.  Pulmonary:     Effort: Pulmonary effort is normal.  Abdominal:     General: Abdomen is flat. Bowel sounds are normal.   Skin:    General: Skin is warm.     Capillary Refill: Capillary refill takes less than 2 seconds.   Neurological:     General: No focal deficit  present.     Mental Status: He is alert and oriented to person, place, and time. Mental status is at baseline.   Psychiatric:        Mood and Affect: Mood normal.        Behavior: Behavior normal.        Thought Content: Thought content normal.        Judgment: Judgment normal.    No results found for any visits on 10/01/23.  Last hemoglobin A1c Lab Results  Component Value Date   HGBA1C 7.9 (A) 10/01/2023      The 10-year ASCVD risk score (Arnett DK, et al., 2019) is: 35.1%    Assessment & Plan:   Problem List Items Addressed This Visit   None Type 2 diabetes mellitus without complication, without long-term current use of insulin  (HCC) -     POCT glycosylated hemoglobin (Hb A1C)  Need for vaccination against Streptococcus pneumoniae -     Pneumococcal conjugate vaccine 20-valent  Benign essential hypertension -     Losartan  Potassium; Take 1 tablet (100 mg total) by mouth daily.  Dispense: 90  tablet; Refill: 1   Pt with worsening diabetes up from 7.2 to 7.9. He does admit to missing his Ozempic  injections 1mg  weekly. Advised on getting back on these to help with glucose control. Follow up in 3 months.  PCV-20 today BP not at goal. Increase Losartan  from 75mg  to 100mg  daily. See back for nurse bp check in 2 weeks.    No follow-ups on file.    [name] Barrier, MD

## 2023-10-12 ENCOUNTER — Encounter: Attending: Physical Medicine & Rehabilitation | Admitting: Physical Medicine & Rehabilitation

## 2023-10-12 ENCOUNTER — Encounter: Payer: Self-pay | Admitting: Physical Medicine & Rehabilitation

## 2023-10-12 VITALS — BP 123/82 | HR 95 | Temp 97.8°F

## 2023-10-12 DIAGNOSIS — M545 Low back pain, unspecified: Secondary | ICD-10-CM | POA: Insufficient documentation

## 2023-10-12 DIAGNOSIS — M5432 Sciatica, left side: Secondary | ICD-10-CM | POA: Insufficient documentation

## 2023-10-12 DIAGNOSIS — M79605 Pain in left leg: Secondary | ICD-10-CM | POA: Insufficient documentation

## 2023-10-12 MED ORDER — DEXAMETHASONE SODIUM PHOSPHATE 10 MG/ML IJ SOLN
20.0000 mg | Freq: Once | INTRAMUSCULAR | Status: AC
  Administered 2023-10-12: 20 mg

## 2023-10-12 MED ORDER — LIDOCAINE HCL (PF) 1 % IJ SOLN
4.0000 mL | Freq: Once | INTRAMUSCULAR | Status: AC
  Administered 2023-10-12: 4 mL

## 2023-10-12 MED ORDER — IOHEXOL 180 MG/ML  SOLN
3.0000 mL | Freq: Once | INTRAMUSCULAR | Status: AC
  Administered 2023-10-12: 3 mL

## 2023-10-12 MED ORDER — LIDOCAINE HCL 1 % IJ SOLN
5.0000 mL | Freq: Once | INTRAMUSCULAR | Status: AC
  Administered 2023-10-12: 5 mL

## 2023-10-12 NOTE — Progress Notes (Signed)
 LEFT L2-3 and L3-4 Lumbar transforaminal epidural steroid injection under fluoroscopic guidance with contrast enhancement  Indication: Lumbosacral radiculitis is not relieved by medication management or other conservative care and interfering with self-care and mobility.  CLINICAL DATA:  Low back pain and bilateral leg pain with foot numbness for 9 months.   EXAM: MRI LUMBAR SPINE WITHOUT CONTRAST   TECHNIQUE: Multiplanar, multisequence MR imaging of the lumbar spine was performed. No intravenous contrast was administered.   COMPARISON:  Lumbar radiographs 09/20/2022   FINDINGS: Segmentation: There are five lumbar type vertebral bodies. The last full intervertebral disc space is labeled L5-S1. This correlates with the lumbar radiographs.   Alignment:  Normal   Vertebrae:  No fracture, evidence of discitis, or bone lesion.   Conus medullaris and cauda equina: Conus extends to the T12-L1 level. Possible cord thinning in the lower thoracic area at the T10-11 level. There is also a elongated area abnormal T2 and STIR signal intensity in the lower cord just above the conus which appears to be focal syrinx. There may be slight clumping of the nerve roots just below the conus. I would recommend a follow-up MRI of the thoracolumbar spine with contrast for further evaluation of these findings.   Paraspinal and other soft tissues: No significant paraspinal or retroperitoneal findings.   Disc levels:   T11-12: No significant findings. The syrinx starts just at the T11-12 disc space.   T12-L1: No disc protrusions, spinal or foraminal stenosis. Mild facet disease.   L1-2: No disc protrusions, spinal or foraminal stenosis. There is a small left paraspinal lipoma which appears to involve the left neural foramen and has slight mass effect on the left side of the thecal sac. Slight displacement of the left L1 nerve root.   L2-3: Mild degenerative disc disease with a slight bulging  annulus. There is also moderate facet disease and ligamentum flavum thickening contributing to moderate spinal and bilateral lateral recess stenosis. No foraminal stenosis. There is also a tiny synovial cyst on the right but no mass effect.   L3-4: Moderate facet disease and ligamentum flavum thickening in combination with a mild bulging annulus contributing to early spinal stenosis and mild bilateral lateral recess stenosis. No significant foraminal stenosis. Mild foraminal encroachment on the right due to a shallow disc osteophyte complex.   L4-5: Advanced facet disease, ligamentum flavum thickening and mild bulging annulus contributing to mild spinal stenosis and moderate bilateral lateral recess stenosis. Mild foraminal stenosis bilaterally.   L5-S1: Advanced degenerative disc disease with disc space narrowing, osteophytic ridging and a bulging degenerated annulus. The spinal canal is generous in is no significant spinal stenosis. Broad-based right-sided disc osteophyte complex with mild encroachment on the right S1 nerve root. The exiting L5 nerve roots appear normal.   IMPRESSION: 1. Possible cord thinning in the lower thoracic area at the T10-11 level. There is also a elongated area of abnormal T2 and STIR signal intensity in the lower cord just above the conus which appears to be focal syrinx. I would recommend a follow-up MRI of the thoracic spine without and with contrast for further evaluation of these findings. 2. Moderate multifactorial spinal and bilateral lateral recess stenosis at L2-3. 3. Early spinal stenosis and mild bilateral lateral recess stenosis at L3-4. Mild foraminal encroachment on the right due to a shallow disc osteophyte complex. 4. Mild spinal stenosis and moderate bilateral lateral recess stenosis at L4-5. Mild foraminal stenosis bilaterally. 5. Advanced degenerative disc disease at L5-S1 with a broad-based right-sided disc  osteophyte complex with  mild encroachment on the right S1 nerve root.     Electronically Signed   By: MYRTIS Stammer M.D.   On: 08/08/2023 11:56  Informed consent was obtained after describing risk and benefits of the procedure with the patient, this includes bleeding, bruising, infection, paralysis and medication side effects.  The patient wishes to proceed and has given written consent.  Patient was placed in prone position.  The lumbar area was marked and prepped with Betadine .  It was entered with a 25-gauge 1-1/2 inch needle and one mL of 1% lidocaine  was injected into the skin and subcutaneous tissue.  Then a 22-gauge 5in spinal needle was inserted into the Left L3-4 intervertebral foramen under AP, lateral, and oblique view.  Once needle tip was within the foramen on lateral views and or exceeding 6 o clock position on the pedicle on AP view, Omnipaque  180 was injected x 2ml Then a solution containing one mL of 10 mg per mL dexamethasone  and 2 mL of 1% lidocaine  was injected.  This same procedure was performed at the left L2-3 level with same equipment , technique and injectate. The patient tolerated procedure well.  Post procedure instructions were given.  Please see post procedure form.

## 2023-10-12 NOTE — Patient Instructions (Signed)
 You received an epidural steroid injection at left L2-3 and L3-4 under fluoroscopic guidance. This is the most accurate way to perform an epidural injection. This injection was performed to relieve thigh or leg or foot pain that may be related to a pinched nerve in the lumbar spine. The local anesthetic injected today may cause numbness in your leg for a couple hours. If it is severe we may need to observe you for 30-60 minutes after the injection. The cortisone medicine injected today may take several days to take full effect. This medicine can also cause facial flushing or feeling of being warm.  This injection may last for days weeks or months. It can be repeated if needed. If it is not effective, another spinal level may need to be injected. Other treatments include medication management as well as physical therapy. In some cases surgery may be an option.

## 2023-10-12 NOTE — Progress Notes (Signed)
  PROCEDURE RECORD [facility]   Name: Ernest Navarro. DOB:Dec 27, 1960 MRN: [id number]  Date:10/12/2023  Physician: Prentice Compton, MD    Nurse/CMA: Jama, CMA  Allergies:  Allergies  Allergen Reactions   Lisinopril     Headache   Metformin  And Related Nausea Only   Oxycodone  Nausea Only    Makes him delirious    Shellfish Allergy     Flares gout     Consent Signed: Yes.    Is patient diabetic? Yes.    CBG today? unsure  Pregnant: No. LMP: No LMP for male patient. (age 75-55)  Anticoagulants: no Anti-inflammatory: yes (celebrex , last dose 10/12/23) Antibiotics: no  Procedure: Left L2-3, L3-4 Transforaminal Epidural Steroid Injection  Position: Prone Start Time: 11:39 am  End Time: 11:50 am  Fluoro Time: 1:12  RN/CMA Jama, CMA Annel Zunker,CMA    Time 11:19 am 11:50 am    BP 123/82 133/82    Pulse 95 92    Respirations 16 16    O2 Sat 92 92    S/S 6 6    Pain Level 7/10 0/10     D/C home with friend, patient A & O X 3, D/C instructions reviewed, and sits independently.

## 2023-10-15 ENCOUNTER — Encounter: Payer: Self-pay | Admitting: Physical Medicine & Rehabilitation

## 2023-10-15 ENCOUNTER — Ambulatory Visit

## 2023-10-16 ENCOUNTER — Ambulatory Visit (INDEPENDENT_AMBULATORY_CARE_PROVIDER_SITE_OTHER): Admitting: Family Medicine

## 2023-10-16 VITALS — BP 157/98 | HR 83 | Temp 98.4°F | Ht 72.0 in | Wt 331.8 lb

## 2023-10-16 DIAGNOSIS — I10 Essential (primary) hypertension: Secondary | ICD-10-CM

## 2023-10-16 NOTE — Progress Notes (Signed)
 Patient is here for blood pressure check. Denies trouble sleeping, palpitations, dizziness, lightheadedness, blurry vision, chest pain, shortness of breath, headaches and/or medication problems.   Patient's blood pressure was 139/84, pulse 86. Patient sat for 15 minutes. Blood pressure recheck was not at goal. Per patient's request multiple readings were collected at 10 minutes intervals. Provider notified of current blood pressure readings. Per provider, patient has been advised to take their blood pressure meds 2 hours prior to having an appointment for efficacy of the medications.  Patient informed to schedule next [place] appointment in 2 weeks.

## 2023-10-21 ENCOUNTER — Encounter: Payer: Self-pay | Admitting: Family Medicine

## 2023-10-21 ENCOUNTER — Encounter: Payer: Self-pay | Admitting: Physical Medicine & Rehabilitation

## 2023-10-23 ENCOUNTER — Other Ambulatory Visit: Payer: Self-pay | Admitting: Physical Medicine & Rehabilitation

## 2023-10-25 ENCOUNTER — Encounter: Payer: Self-pay | Admitting: Physical Medicine & Rehabilitation

## 2023-10-30 ENCOUNTER — Ambulatory Visit

## 2023-10-31 ENCOUNTER — Ambulatory Visit (HOSPITAL_BASED_OUTPATIENT_CLINIC_OR_DEPARTMENT_OTHER)

## 2023-10-31 ENCOUNTER — Ambulatory Visit (INDEPENDENT_AMBULATORY_CARE_PROVIDER_SITE_OTHER): Admitting: Family Medicine

## 2023-10-31 VITALS — BP 140/98 | HR 99 | Temp 98.1°F | Resp 18 | Ht 72.0 in | Wt 334.4 lb

## 2023-10-31 DIAGNOSIS — I10 Essential (primary) hypertension: Secondary | ICD-10-CM

## 2023-10-31 MED ORDER — LOSARTAN POTASSIUM-HCTZ 100-12.5 MG PO TABS: 1.0000 | ORAL_TABLET | Freq: Every day | ORAL | 1 refills | Status: DC

## 2023-10-31 NOTE — Progress Notes (Signed)
   Subjective:    Patient ID: Ernest Navarro., male    DOB: May 21, 1960, 63 y.o.   MRN: [id number]  HPI Patient is here for a BP check    Review of Systems     Objective:   Physical Exam        Assessment & Plan:   Patients Blood pressure was 140/98 today , Provider changed Medication from Losartan  100 mg to (Losartan  hydrochlorothiazide  12.5 mg ), patient is aware and did not have any concerns  Patient with elevated blood pressure. Will add hydrochlorothiazide 12.5mg  to Losartan  100mg  daily. Due to diabetes goal is 130/80 or less. Ernest Navarro CINDERELLA Barrier MD

## 2023-11-06 ENCOUNTER — Other Ambulatory Visit: Payer: Self-pay | Admitting: Family Medicine

## 2023-11-06 DIAGNOSIS — I10 Essential (primary) hypertension: Secondary | ICD-10-CM

## 2023-11-12 ENCOUNTER — Encounter: Payer: Self-pay | Admitting: Physical Therapy

## 2023-11-22 ENCOUNTER — Telehealth: Payer: Self-pay

## 2023-11-22 NOTE — Telephone Encounter (Signed)
 Since I've never prescribed, will need to make an appt for an office visit.

## 2023-11-22 NOTE — Telephone Encounter (Signed)
 Patient called yesterday , Returned patients call and patient is requesting a Prescription for Viagra.  Confirmed patients pharmacy is:   [facility], [address]    Please advise   Copied from CRM [id number]. Topic: General - Other >> Nov 21, 2023  2:12 PM Charlet HERO wrote: Reason for CRM: Patient is wanting the nurse to give him a call back.

## 2023-11-24 ENCOUNTER — Other Ambulatory Visit: Payer: Self-pay | Admitting: Family Medicine

## 2023-11-24 DIAGNOSIS — I10 Essential (primary) hypertension: Secondary | ICD-10-CM

## 2023-11-26 ENCOUNTER — Encounter: Payer: Self-pay | Admitting: Family Medicine

## 2023-11-26 NOTE — Telephone Encounter (Signed)
 Spoke with patient and advised him that he would need an appointment for medication to be prescribed. Patient states that he will just wait and if he changes his mind he will make an appointment

## 2023-11-27 ENCOUNTER — Telehealth: Payer: Self-pay

## 2023-11-27 NOTE — Telephone Encounter (Signed)
 Insurance denied his last injection. Are you willing to do a peer to peer? Ph# for peer to peer [phone]

## 2023-12-04 ENCOUNTER — Encounter: Payer: Self-pay | Admitting: Physical Medicine & Rehabilitation

## 2023-12-04 ENCOUNTER — Encounter: Attending: Physical Medicine & Rehabilitation | Admitting: Physical Medicine & Rehabilitation

## 2023-12-04 VITALS — BP 129/84 | HR 97 | Ht 72.0 in | Wt 332.4 lb

## 2023-12-04 DIAGNOSIS — M79605 Pain in left leg: Secondary | ICD-10-CM | POA: Diagnosis present

## 2023-12-04 DIAGNOSIS — M545 Low back pain, unspecified: Secondary | ICD-10-CM | POA: Diagnosis not present

## 2023-12-04 NOTE — Progress Notes (Addendum)
 Subjective:    Patient ID: Ernest Navarro., male    DOB: 15-Jul-1960, 63 y.o.   MRN: [id number]  HPI 63 year old male with left L3-L4 lumbar radiculopathies who returns today much improved in terms of his left lower extremity pain and numbness.  He underwent left L2-3 and left L3-4 lumbar epidural injections transforaminal route under fluoroscopic guidance on 10/12/2023.  His onset of pain relief was approximately 2 to 3 weeks postinjection. No tramadol  x 3-4 wks No gabapentin  x 3-4 wks No celebrex  x 3-4 wks  Uploaded 40 bags mulch from back without aggravation of pain He has been able to exercise more and has gotten into swimming laps at the pool.  Also has been using his hot tub on a daily basis. He has no weakness in the left lower extremity.  No bowel or bladder dysfunction The patient continues to work on a full-time basis. Pain Inventory Average Pain 3 Pain Right Now 3 My pain is sharp, dull, stabbing, and aching  In the last 24 hours, has pain interfered with the following? General activity 7 Relation with others 7 Enjoyment of life 5 What TIME of day is your pain at its worst? morning  and night Sleep (in general) Poor  Pain is worse with: walking, bending, sitting, inactivity, standing, and some activites Pain improves with: rest, heat/ice, therapy/exercise, and injections Relief from Meds: 4  Family History  Problem Relation Age of Onset   Lymphoma Brother    Diabetes Maternal Grandmother    Colon cancer Neg Hx    Pancreatic cancer Neg Hx    Stomach cancer Neg Hx    Esophageal cancer Neg Hx    Liver disease Neg Hx    Social History   Socioeconomic History   Marital status: Single    Spouse name: Not on file   Number of children: Not on file   Years of education: Not on file   Highest education level: Not on file  Occupational History   Not on file  Tobacco Use   Smoking status: Never   Smokeless tobacco: Never  Vaping Use   Vaping status: Never Used   Substance and Sexual Activity   Alcohol use: No    Alcohol/week: 0.0 standard drinks of alcohol   Drug use: No   Sexual activity: Not on file  Other Topics Concern   Not on file  Social History Narrative   Not on file   Social Drivers of Health   Financial Resource Strain: Not on file  Food Insecurity: Patient Declined (03/16/2023)   Hunger Vital Sign    Worried About Running Out of Food in the Last Year: Patient declined    Ran Out of Food in the Last Year: Patient declined  Transportation Needs: Patient Declined (03/16/2023)   PRAPARE - Administrator, Civil Service (Medical): Patient declined    Lack of Transportation (Non-Medical): Patient declined  Physical Activity: Not on file  Stress: Not on file  Social Connections: Unknown (08/13/2021)   Received from [facility]   Social Network    Social Network: Not on file   Past Surgical History:  Procedure Laterality Date   KNEE SURGERY Right    x3 arthroscopies   PARATHYROIDECTOMY N/A 03/16/2023   Procedure: LEFT INFERIOR PARATHYROIDECTOMY;  Surgeon: Eletha Boas, MD;  Location: [facility];  Service: General;  Laterality: N/A;   TOTAL KNEE ARTHROPLASTY Right 10/28/2020   Procedure: RIGHT TOTAL KNEE ARTHROPLASTY;  Surgeon: Addie Cordella Hamilton, MD;  Location: [facility] OR;  Service: Orthopedics;  Laterality: Right;   TOTAL KNEE ARTHROPLASTY Left 09/05/2021   Procedure: LEFT TOTAL KNEE ARTHROPLASTY;  Surgeon: Addie Cordella Hamilton, MD;  Location: [facility];  Service: Orthopedics;  Laterality: Left;   Past Surgical History:  Procedure Laterality Date   KNEE SURGERY Right    x3 arthroscopies   PARATHYROIDECTOMY N/A 03/16/2023   Procedure: LEFT INFERIOR PARATHYROIDECTOMY;  Surgeon: Eletha Boas, MD;  Location: [facility];  Service: General;  Laterality: N/A;   TOTAL KNEE ARTHROPLASTY Right 10/28/2020   Procedure: RIGHT TOTAL KNEE ARTHROPLASTY;  Surgeon: Addie Cordella Hamilton, MD;  Location: [facility] OR;  Service: Orthopedics;  Laterality:  Right;   TOTAL KNEE ARTHROPLASTY Left 09/05/2021   Procedure: LEFT TOTAL KNEE ARTHROPLASTY;  Surgeon: Addie Cordella Hamilton, MD;  Location: [facility] OR;  Service: Orthopedics;  Laterality: Left;   Past Medical History:  Diagnosis Date   Arthritis    bilateral knees   Class 3 severe obesity due to excess calories with serious comorbidity and body mass index (BMI) of 40.0 to 44.9 in adult 11/13/2018   Essential hypertension, benign 06/19/2012   Obstructive sleep apnea syndrome 05/06/2015   Formatting of this note might be different from the original. Overview: High probability diagnosis based on previous reported sleep study and weight gain with witnessed apneas and loud snoring. We discussed the basic physiology and medical concerns. Plan-schedule sleep study, emphasize driving responsibility and advise weight loss Last Assessment & Plan: Formatting of this note might be different    Other male erectile dysfunction 05/25/2017   Pneumonia    Sleep apnea    Type 2 diabetes mellitus without complication, without long-term current use of insulin  (HCC) 05/15/2019   BP (!) 156/96   Pulse 97   Ht 6' (1.829 m)   Wt (!) 332 lb 6.4 oz (150.8 kg)   SpO2 94%   BMI 45.08 kg/m   Opioid Risk Score:   Fall Risk Score:  `1  Depression screen [facility] 2/9     12/04/2023   10:09 AM 09/27/2023    9:50 AM 08/16/2023   10:01 AM 07/19/2023    9:42 AM 11/09/2022   11:49 AM 04/05/2022    9:38 AM  Depression screen PHQ 2/9  Decreased Interest 0 0 0 0 0 0  Down, Depressed, Hopeless 0 0 0 0 0 0  PHQ - 2 Score 0 0 0 0 0 0  Altered sleeping     2 1  Tired, decreased energy     1 1  Change in appetite     1 1  Feeling bad or failure about yourself      0 0  Trouble concentrating     0 0  Moving slowly or fidgety/restless     0   Suicidal thoughts     0 0  PHQ-9 Score     4 3  Difficult doing work/chores      Not difficult at all     Review of Systems  Musculoskeletal:  Positive for back pain.       Left leg  All  other systems reviewed and are negative.      Objective:   Physical Exam General No acute distress, obese Lumbar spine no tenderness palpation in lumbar paraspinals he has normal range of motion of the lumbar spine in flexion extension and lateral bending. Negative straight leg raising bilateral lower extremity Ambulates without assistive device no evidence of toe drag or knee instability Decreased pp  L3 and L4 dermatomes on left side   MMT 5/5 in BLE at the hip flexors, knee extensors, ankle dorsiflexors and plantar flexors       Assessment & Plan:  1.  Left L3 and left L4 radiculopathy is improved after left L2-L3 and left L3-L4 transforaminal lumbar epidural steroid injections under fluoroscopic guidance. Have encouraged patient to continue his swimming exercise. He feels like he can now engage in physical therapy without limiting pain.  He is already doing strengthening with his swimming will ask therapy to concentrate more on range of motion. I will see the patient back in 3 months. At this point I do not see a need for repeat epidural injections. Will not need to refill Celebrex , tramadol , or gabapentin    APPEAL for denied procedures  [phone] and [phone] The patient's insurance denied his left L2-3 and left L3-4 transforaminal lumbar epidural steroid injection under fluoroscopic guidance performed on 10/12/2023. As noted above this is resulted in the patient's ability to stop all pain medications and get back to his usual activities.  He has also been able to resume exercise. The recently epidural was done at 2 levels on the left side was because of his symptomatology which was left thigh.  His most recent lumbar MRI is as follows: MRI LUMBAR SPINE WITHOUT CONTRAST   TECHNIQUE: Multiplanar, multisequence MR imaging of the lumbar spine was performed. No intravenous contrast was administered.   COMPARISON:  Lumbar radiographs 09/20/2022   FINDINGS: Segmentation: There are five  lumbar type vertebral bodies. The last full intervertebral disc space is labeled L5-S1. This correlates with the lumbar radiographs.   Alignment:  Normal   Vertebrae:  No fracture, evidence of discitis, or bone lesion.   Conus medullaris and cauda equina: Conus extends to the T12-L1 level. Possible cord thinning in the lower thoracic area at the T10-11 level. There is also a elongated area abnormal T2 and STIR signal intensity in the lower cord just above the conus which appears to be focal syrinx. There may be slight clumping of the nerve roots just below the conus. I would recommend a follow-up MRI of the thoracolumbar spine with contrast for further evaluation of these findings.   Paraspinal and other soft tissues: No significant paraspinal or retroperitoneal findings.   Disc levels:   T11-12: No significant findings. The syrinx starts just at the T11-12 disc space.   T12-L1: No disc protrusions, spinal or foraminal stenosis. Mild facet disease.   L1-2: No disc protrusions, spinal or foraminal stenosis. There is a small left paraspinal lipoma which appears to involve the left neural foramen and has slight mass effect on the left side of the thecal sac. Slight displacement of the left L1 nerve root.   L2-3: Mild degenerative disc disease with a slight bulging annulus. There is also moderate facet disease and ligamentum flavum thickening contributing to moderate spinal and bilateral lateral recess stenosis. No foraminal stenosis. There is also a tiny synovial cyst on the right but no mass effect.   L3-4: Moderate facet disease and ligamentum flavum thickening in combination with a mild bulging annulus contributing to early spinal stenosis and mild bilateral lateral recess stenosis. No significant foraminal stenosis. Mild foraminal encroachment on the right due to a shallow disc osteophyte complex.   L4-5: Advanced facet disease, ligamentum flavum thickening and  mild bulging annulus contributing to mild spinal stenosis and moderate bilateral lateral recess stenosis. Mild foraminal stenosis bilaterally.   L5-S1: Advanced degenerative disc disease with disc space  narrowing, osteophytic ridging and a bulging degenerated annulus. The spinal canal is generous in is no significant spinal stenosis. Broad-based right-sided disc osteophyte complex with mild encroachment on the right S1 nerve root. The exiting L5 nerve roots appear normal.   IMPRESSION: 1. Possible cord thinning in the lower thoracic area at the T10-11 level. There is also a elongated area of abnormal T2 and STIR signal intensity in the lower cord just above the conus which appears to be focal syrinx. I would recommend a follow-up MRI of the thoracic spine without and with contrast for further evaluation of these findings. 2. Moderate multifactorial spinal and bilateral lateral recess stenosis at L2-3. 3. Early spinal stenosis and mild bilateral lateral recess stenosis at L3-4. Mild foraminal encroachment on the right due to a shallow disc osteophyte complex. 4. Mild spinal stenosis and moderate bilateral lateral recess stenosis at L4-5. Mild foraminal stenosis bilaterally. 5. Advanced degenerative disc disease at L5-S1 with a broad-based right-sided disc osteophyte complex with mild encroachment on the right S1 nerve root.     Electronically Signed   By: MYRTIS Stammer M.D.   On: 08/08/2023 11:56

## 2023-12-04 NOTE — Telephone Encounter (Signed)
 I sent the info to your secure chat

## 2023-12-05 ENCOUNTER — Encounter: Payer: Self-pay | Admitting: *Deleted

## 2023-12-07 ENCOUNTER — Other Ambulatory Visit (INDEPENDENT_AMBULATORY_CARE_PROVIDER_SITE_OTHER)

## 2023-12-07 ENCOUNTER — Ambulatory Visit: Payer: Self-pay | Admitting: Orthopedic Surgery

## 2023-12-07 ENCOUNTER — Encounter: Payer: Self-pay | Admitting: Orthopedic Surgery

## 2023-12-07 DIAGNOSIS — M25571 Pain in right ankle and joints of right foot: Secondary | ICD-10-CM

## 2023-12-07 NOTE — Progress Notes (Signed)
 Office Visit Note   Patient: Ernest Navarro.           Date of Birth: May 17, 1960           MRN: [id number] Visit Date: 12/07/2023 Requested by: Colette Torrence GRADE, MD [address] [place],  [place] [address] PCP: Colette Torrence GRADE, MD  Subjective: Chief Complaint  Patient presents with   Right Foot - Pain    Patient having right foot pain x 3 months Painful to WT[facility] Complains of foot turning in    HPI: Ernest Navarro. is a 63 y.o. male who presents to the office reporting right foot pain and toe pain.  He jammed toes 1 2 and 3 about 3 to 4 weeks ago.  Has had pain since that time.  Concerned about the alignment of the 2nd and 3rd toes relative to the great toe.  Also concerned about nails on his toes.  His knees are doing well..                ROS: All systems reviewed are negative as they relate to the chief complaint within the history of present illness.  Patient denies fevers or chills.  Assessment & Plan: Visit Diagnoses:  1. Pain in right ankle and joints of right foot     Plan: Impression is fungal infection and some of the toenails on the right and left foot.  He does have some mild deformity right second toe.  I think new diagnosis of diabetes he has to be very careful with any intervention below the level of the knee replacement out of concern for infection which could give him bigger problem than what he has with the feet.  Issues are quite narrow relative to the width of his foot.  That may be contributing in some degree to his foot problems particular on the right-hand side.  Would have him follow-up with the podiatrist about nonoperative treatment for the toenails.  Would not recommend any type of nail removal at the indication of infection.  He will follow-up with us  as needed.  Follow-Up Instructions: No follow-ups on file.   Orders:  Orders Placed This Encounter  Procedures   XR Foot Complete Right   No orders of the defined types were placed in this  encounter.     Procedures: No procedures performed   Clinical Data: No additional findings.  Objective: Vital Signs: There were no vitals taken for this visit.  Physical Exam:  Constitutional: Patient appears well-developed HEENT:  Head: Normocephalic Eyes:EOM are normal Neck: Normal range of motion Cardiovascular: Normal rate Pulmonary/chest: Effort normal Neurologic: Patient is alert Skin: Skin is warm Psychiatric: Patient has normal mood and affect  Ortho Exam: Ortho exam demonstrates full active and passive range of motion of the ankles.  He does have some fungal infection some of the toes on the right left-hand side.  Pedal pulses are palpable.  Toes are drifting little bit towards the great toe in terms of toes #2 and 3 on the right.  Slight hammertoe on the right second toe as well.  Heel cords not excessively tight.  Specialty Comments:  No specialty comments available.  Imaging: No results found.   PMFS History: Patient Active Problem List   Diagnosis Date Noted   Lumbar pain with radiation down left leg 09/27/2023   Status post parathyroidectomy 04/10/2023   Primary hyperparathyroidism (HCC) 03/16/2023   Hyperparathyroidism, primary (HCC) 03/05/2023   Arthritis of left knee  S/P total knee arthroplasty, left 09/05/2021   S/P total knee arthroplasty, right 10/28/2020   Type 2 diabetes mellitus without complication, without long-term current use of insulin  (HCC) 05/15/2019   Hypotestosteronism 05/13/2019   Class 3 severe obesity due to excess calories with serious comorbidity and body mass index (BMI) of 40.0 to 44.9 in adult 11/13/2018   Other male erectile dysfunction 05/25/2017   Lichen planus 05/27/2015   Obstructive sleep apnea syndrome 05/06/2015   Gout 06/19/2012   Benign essential hypertension 06/19/2012   Past Medical History:  Diagnosis Date   Arthritis    bilateral knees   Class 3 severe obesity due to excess calories with serious  comorbidity and body mass index (BMI) of 40.0 to 44.9 in adult 11/13/2018   Essential hypertension, benign 06/19/2012   Obstructive sleep apnea syndrome 05/06/2015   Formatting of this note might be different from the original. Overview: High probability diagnosis based on previous reported sleep study and weight gain with witnessed apneas and loud snoring. We discussed the basic physiology and medical concerns. Plan-schedule sleep study, emphasize driving responsibility and advise weight loss Last Assessment & Plan: Formatting of this note might be different    Other male erectile dysfunction 05/25/2017   Pneumonia    Sleep apnea    Type 2 diabetes mellitus without complication, without long-term current use of insulin  (HCC) 05/15/2019    Family History  Problem Relation Age of Onset   Lymphoma Brother    Diabetes Maternal Grandmother    Colon cancer Neg Hx    Pancreatic cancer Neg Hx    Stomach cancer Neg Hx    Esophageal cancer Neg Hx    Liver disease Neg Hx     Past Surgical History:  Procedure Laterality Date   KNEE SURGERY Right    x3 arthroscopies   PARATHYROIDECTOMY N/A 03/16/2023   Procedure: LEFT INFERIOR PARATHYROIDECTOMY;  Surgeon: Eletha Boas, MD;  Location: [facility];  Service: General;  Laterality: N/A;   TOTAL KNEE ARTHROPLASTY Right 10/28/2020   Procedure: RIGHT TOTAL KNEE ARTHROPLASTY;  Surgeon: Addie Cordella Hamilton, MD;  Location: [facility] OR;  Service: Orthopedics;  Laterality: Right;   TOTAL KNEE ARTHROPLASTY Left 09/05/2021   Procedure: LEFT TOTAL KNEE ARTHROPLASTY;  Surgeon: Addie Cordella Hamilton, MD;  Location: [facility];  Service: Orthopedics;  Laterality: Left;   Social History   Occupational History   Not on file  Tobacco Use   Smoking status: Never   Smokeless tobacco: Never  Vaping Use   Vaping status: Never Used  Substance and Sexual Activity   Alcohol use: No    Alcohol/week: 0.0 standard drinks of alcohol   Drug use: No   Sexual activity: Not on file

## 2023-12-10 ENCOUNTER — Ambulatory Visit: Admitting: Orthopedic Surgery

## 2023-12-13 ENCOUNTER — Other Ambulatory Visit: Payer: Self-pay | Admitting: Family Medicine

## 2024-01-05 ENCOUNTER — Other Ambulatory Visit: Payer: Self-pay | Admitting: Physical Medicine & Rehabilitation

## 2024-01-07 ENCOUNTER — Ambulatory Visit: Admitting: Family Medicine

## 2024-01-18 ENCOUNTER — Telehealth: Payer: Self-pay

## 2024-01-18 ENCOUNTER — Other Ambulatory Visit: Payer: Self-pay | Admitting: Family Medicine

## 2024-01-18 DIAGNOSIS — E119 Type 2 diabetes mellitus without complications: Secondary | ICD-10-CM

## 2024-01-18 NOTE — Telephone Encounter (Signed)
 Copied from CRM #[id number]. Topic: Clinical - Medication Question >> Jan 18, 2024 11:54 AM Ernest Navarro wrote: Reason for CRM: Pt was called to schedule med check for  Refused Semaglutide  4 MG/3ML INJECT 1 MG INTO THE SKIN ONE TIME PER WEEK. Pt has stated he still has medication to put him through the next few months, stating refill is not needed. Please advise # [phone]

## 2024-01-18 NOTE — Telephone Encounter (Signed)
 Called patient, no answer, phone doesn't have a voicemail setup, so I sent the patient a message asking him to call back to schedule a medication refill appointment with Darice Brownie, FNP.

## 2024-02-04 ENCOUNTER — Encounter: Payer: Self-pay | Admitting: Radiology

## 2024-03-04 ENCOUNTER — Encounter: Admitting: Physical Medicine & Rehabilitation

## 2024-04-07 ENCOUNTER — Ambulatory Visit: Admitting: Family Medicine

## 2024-04-11 ENCOUNTER — Encounter: Admitting: Physical Medicine & Rehabilitation

## 2024-04-12 ENCOUNTER — Other Ambulatory Visit: Payer: Self-pay | Admitting: Family Medicine

## 2024-04-12 DIAGNOSIS — I10 Essential (primary) hypertension: Secondary | ICD-10-CM

## 2024-04-17 ENCOUNTER — Other Ambulatory Visit: Payer: Self-pay | Admitting: Family Medicine

## 2024-04-17 DIAGNOSIS — E119 Type 2 diabetes mellitus without complications: Secondary | ICD-10-CM

## 2024-04-18 NOTE — Telephone Encounter (Signed)
 Pt has been scheduled for 06/02/2024. I have sent in a 45-day refill for GLIPIZIDE  in order for pt to reach his appointment date.

## 2024-04-25 ENCOUNTER — Encounter: Admitting: Physical Medicine & Rehabilitation

## 2024-06-02 ENCOUNTER — Ambulatory Visit: Admitting: Family Medicine

## 2024-06-06 ENCOUNTER — Encounter: Admitting: Physical Medicine & Rehabilitation
